# Patient Record
Sex: Male | Born: 1955 | Race: White | Hispanic: No | Marital: Single | Smoking: Current every day smoker
Health system: Southern US, Community
[De-identification: ages and names within clinical notes are randomized; demographics above are authoritative.]

## PROBLEM LIST (undated history)

## (undated) DIAGNOSIS — M899 Disorder of bone, unspecified: Principal | ICD-10-CM

## (undated) DIAGNOSIS — C649 Malignant neoplasm of unspecified kidney, except renal pelvis: Secondary | ICD-10-CM

## (undated) HISTORY — DX: Disorder of bone, unspecified: M89.9

## (undated) HISTORY — PX: OTHER SURGICAL HISTORY: SHX169

## (undated) HISTORY — DX: Malignant neoplasm of unspecified kidney, except renal pelvis: C64.9

---

## 2015-10-20 ENCOUNTER — Other Ambulatory Visit (HOSPITAL_COMMUNITY): Payer: Self-pay | Admitting: Oncology

## 2015-10-20 ENCOUNTER — Encounter (HOSPITAL_COMMUNITY): Payer: Self-pay | Admitting: Oncology

## 2015-10-20 DIAGNOSIS — M899 Disorder of bone, unspecified: Secondary | ICD-10-CM

## 2015-10-20 HISTORY — DX: Disorder of bone, unspecified: M89.9

## 2015-10-21 ENCOUNTER — Ambulatory Visit (HOSPITAL_COMMUNITY)
Admission: RE | Admit: 2015-10-21 | Discharge: 2015-10-21 | Disposition: A | Discharge status: Home / Self Care | Payer: BLUE CROSS/BLUE SHIELD | Source: Ambulatory Visit | Attending: Oncology | Admitting: Oncology

## 2015-10-21 DIAGNOSIS — N2889 Other specified disorders of kidney and ureter: Secondary | ICD-10-CM | POA: Diagnosis not present

## 2015-10-21 DIAGNOSIS — M899 Disorder of bone, unspecified: Secondary | ICD-10-CM

## 2015-10-21 LAB — POCT I-STAT CREATININE: Creatinine, Ser: 1.1 mg/dL (ref 0.61–1.24)

## 2015-10-21 MED ORDER — IOHEXOL 300 MG/ML  SOLN
75.0000 mL | Freq: Once | INTRAMUSCULAR | Status: AC | PRN
  Administered 2015-10-21: 75 mL via INTRAVENOUS

## 2015-10-21 MED ORDER — GADOBENATE DIMEGLUMINE 529 MG/ML IV SOLN
14.0000 mL | Freq: Once | INTRAVENOUS | Status: AC | PRN
  Administered 2015-10-21: 14 mL via INTRAVENOUS

## 2015-10-21 MED ORDER — IOHEXOL 350 MG/ML SOLN
75.0000 mL | Freq: Once | INTRAVENOUS | Status: AC | PRN
  Administered 2015-10-21: 100 mL via INTRAVENOUS

## 2015-10-23 ENCOUNTER — Encounter (HOSPITAL_COMMUNITY): Payer: Self-pay | Admitting: Oncology

## 2015-10-23 DIAGNOSIS — C649 Malignant neoplasm of unspecified kidney, except renal pelvis: Secondary | ICD-10-CM | POA: Insufficient documentation

## 2015-10-23 HISTORY — DX: Malignant neoplasm of unspecified kidney, except renal pelvis: C64.9

## 2015-10-23 NOTE — Assessment & Plan Note (Addendum)
Right renal mass measuring 6.2 cm (in largest dimension) with right proximal sclerotic lesion of femur, right skull bass destructive lesion with involvement of the medial clivus, occipital condyle, petrous apex and extension into the right cavernous sinus and sphenoid sinus, and intracranial extension into the ventral epidural space.  Imaging suspicious for metastatic renal cell carcinoma.  Labs today: CBC diff, CMET, LDH  I personally reviewed and went over radiographic studies with the patient.  The results are noted within this dictation.    Case discussed with IR.  CT biopsy of right renal lesion is ordered based upon their recommendations for diagnosis.  Case discussed with Neurosurgery.  Dr. Kathyrn Sheriff denies any neurosurgical urgency/emergency.  He would consider seeing there patient following XRT.  Case discussed with Dr. Tammi Klippel, Rad Onc.  He is agreeable to see the patient for XRT therapy.    Message left for Dr. Alinda Money (urology) to discussed the patient's case.  We are awaiting a return phone call.  Would like for him to consider cytoreductive right nephrectomy.   Assuming this is metastatic renal cell, the patient is provided some minor education regarding this in addition to treatment options including XRT and systemic chemotherapy are broached.  Prognosis is also broached as well.  He knows that if this is metastatic renal cell carcinoma, his disease is not curable, but treatable with a possible life expectancy of a couple of years.  We will discuss this further once we have a diagnosis.  Patient will return ~ 5 days post IR biopsy for review of pathology results and further recommendations.
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
## 2015-10-24 ENCOUNTER — Encounter (HOSPITAL_COMMUNITY): Payer: BLUE CROSS/BLUE SHIELD | Attending: Oncology | Admitting: Oncology

## 2015-10-24 ENCOUNTER — Encounter (HOSPITAL_COMMUNITY): Payer: Self-pay | Admitting: Oncology

## 2015-10-24 VITALS — BP 145/98 | HR 93 | Temp 99.4°F | Resp 20 | Wt 157.0 lb

## 2015-10-24 DIAGNOSIS — Z8249 Family history of ischemic heart disease and other diseases of the circulatory system: Secondary | ICD-10-CM | POA: Diagnosis not present

## 2015-10-24 DIAGNOSIS — H4921 Sixth [abducent] nerve palsy, right eye: Secondary | ICD-10-CM | POA: Diagnosis not present

## 2015-10-24 DIAGNOSIS — M899 Disorder of bone, unspecified: Secondary | ICD-10-CM | POA: Diagnosis present

## 2015-10-24 DIAGNOSIS — N289 Disorder of kidney and ureter, unspecified: Secondary | ICD-10-CM | POA: Diagnosis not present

## 2015-10-24 DIAGNOSIS — Z720 Tobacco use: Secondary | ICD-10-CM

## 2015-10-24 DIAGNOSIS — N2889 Other specified disorders of kidney and ureter: Secondary | ICD-10-CM | POA: Insufficient documentation

## 2015-10-24 DIAGNOSIS — F17210 Nicotine dependence, cigarettes, uncomplicated: Secondary | ICD-10-CM | POA: Insufficient documentation

## 2015-10-24 DIAGNOSIS — G588 Other specified mononeuropathies: Secondary | ICD-10-CM

## 2015-10-24 DIAGNOSIS — R930 Abnormal findings on diagnostic imaging of skull and head, not elsewhere classified: Secondary | ICD-10-CM

## 2015-10-24 LAB — COMPREHENSIVE METABOLIC PANEL
ALK PHOS: 83 U/L (ref 38–126)
ALT: 16 U/L — AB (ref 17–63)
AST: 15 U/L (ref 15–41)
Albumin: 4.1 g/dL (ref 3.5–5.0)
Anion gap: 7 (ref 5–15)
BUN: 13 mg/dL (ref 6–20)
CALCIUM: 8.9 mg/dL (ref 8.9–10.3)
CO2: 24 mmol/L (ref 22–32)
CREATININE: 0.93 mg/dL (ref 0.61–1.24)
Chloride: 107 mmol/L (ref 101–111)
Glucose, Bld: 102 mg/dL — ABNORMAL HIGH (ref 65–99)
Potassium: 4 mmol/L (ref 3.5–5.1)
Sodium: 138 mmol/L (ref 135–145)
Total Bilirubin: 0.2 mg/dL — ABNORMAL LOW (ref 0.3–1.2)
Total Protein: 7.3 g/dL (ref 6.5–8.1)

## 2015-10-24 LAB — CBC WITH DIFFERENTIAL/PLATELET
Basophils Absolute: 0.1 10*3/uL (ref 0.0–0.1)
Basophils Relative: 1 %
Eosinophils Absolute: 0.1 10*3/uL (ref 0.0–0.7)
Eosinophils Relative: 1 %
HEMATOCRIT: 49.9 % (ref 39.0–52.0)
HEMOGLOBIN: 16.9 g/dL (ref 13.0–17.0)
LYMPHS ABS: 2.4 10*3/uL (ref 0.7–4.0)
LYMPHS PCT: 22 %
MCH: 30 pg (ref 26.0–34.0)
MCHC: 33.9 g/dL (ref 30.0–36.0)
MCV: 88.5 fL (ref 78.0–100.0)
Monocytes Absolute: 0.9 10*3/uL (ref 0.1–1.0)
Monocytes Relative: 9 %
NEUTROS PCT: 67 %
Neutro Abs: 7.3 10*3/uL (ref 1.7–7.7)
Platelets: 251 10*3/uL (ref 150–400)
RBC: 5.64 MIL/uL (ref 4.22–5.81)
RDW: 13.3 % (ref 11.5–15.5)
WBC: 10.8 10*3/uL — AB (ref 4.0–10.5)

## 2015-10-24 LAB — LACTATE DEHYDROGENASE: LDH: 135 U/L (ref 98–192)

## 2015-10-24 NOTE — Patient Instructions (Signed)
[place] at [facility] Discharge Instructions  RECOMMENDATIONS MADE BY THE CONSULTANT AND ANY TEST RESULTS WILL BE SENT TO YOUR REFERRING PHYSICIAN.    Thank you for choosing [place] at [facility] to provide your oncology and hematology care.  To afford each patient quality time with our provider, please arrive at least 15 minutes before your scheduled appointment time.   Beginning January 23rd 2017 lab work for the [facility] will be done in the  Main lab at [facility] on 1st floor. If you have a lab appointment with the [place] please come in thru the  Main Entrance and check in at the main information desk  You need to re-schedule your appointment should you arrive 10 or more minutes late.  We strive to give you quality time with our providers, and arriving late affects you and other patients whose appointments are after yours.  Also, if you no show three or more times for appointments you may be dismissed from the clinic at the providers discretion.     Again, thank you for choosing [facility].  Our hope is that these requests will decrease the amount of time that you wait before being seen by our physicians.       _____________________________________________________________  Should you have questions after your visit to [facility], please contact our office at [phone] between the hours of 8:30 a.m. and 4:30 p.m.  Voicemails left after 4:30 p.m. will not be returned until the following business day.  For prescription refill requests, have your pharmacy contact our office.

## 2015-10-26 ENCOUNTER — Other Ambulatory Visit: Payer: Self-pay | Admitting: Radiation Therapy

## 2015-10-26 DIAGNOSIS — C7951 Secondary malignant neoplasm of bone: Principal | ICD-10-CM

## 2015-10-26 DIAGNOSIS — C649 Malignant neoplasm of unspecified kidney, except renal pelvis: Secondary | ICD-10-CM

## 2015-10-27 ENCOUNTER — Ambulatory Visit: Payer: BLUE CROSS/BLUE SHIELD | Admitting: Radiation Oncology

## 2015-10-27 ENCOUNTER — Ambulatory Visit: Payer: BLUE CROSS/BLUE SHIELD

## 2015-10-28 ENCOUNTER — Ambulatory Visit: Payer: BLUE CROSS/BLUE SHIELD | Admitting: Radiation Oncology

## 2015-10-31 ENCOUNTER — Other Ambulatory Visit: Payer: Self-pay | Admitting: Radiology
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
## 2015-11-01 ENCOUNTER — Other Ambulatory Visit (HOSPITAL_COMMUNITY): Payer: Self-pay | Admitting: Oncology

## 2015-11-01 ENCOUNTER — Ambulatory Visit (HOSPITAL_COMMUNITY)
Admission: RE | Admit: 2015-11-01 | Discharge: 2015-11-01 | Disposition: A | Discharge status: Home / Self Care | Payer: BLUE CROSS/BLUE SHIELD | Source: Ambulatory Visit | Attending: Oncology | Admitting: Oncology

## 2015-11-01 ENCOUNTER — Encounter (HOSPITAL_COMMUNITY): Payer: Self-pay

## 2015-11-01 DIAGNOSIS — N2889 Other specified disorders of kidney and ureter: Secondary | ICD-10-CM

## 2015-11-01 DIAGNOSIS — C641 Malignant neoplasm of right kidney, except renal pelvis: Secondary | ICD-10-CM | POA: Insufficient documentation

## 2015-11-01 LAB — CBC
HEMATOCRIT: 52.3 % — AB (ref 39.0–52.0)
Hemoglobin: 17.9 g/dL — ABNORMAL HIGH (ref 13.0–17.0)
MCH: 30.1 pg (ref 26.0–34.0)
MCHC: 34.2 g/dL (ref 30.0–36.0)
MCV: 88 fL (ref 78.0–100.0)
PLATELETS: 227 10*3/uL (ref 150–400)
RBC: 5.94 MIL/uL — ABNORMAL HIGH (ref 4.22–5.81)
RDW: 13.5 % (ref 11.5–15.5)
WBC: 10.4 10*3/uL (ref 4.0–10.5)

## 2015-11-01 LAB — GLUCOSE, CAPILLARY: GLUCOSE-CAPILLARY: 104 mg/dL — AB (ref 65–99)

## 2015-11-01 LAB — PROTIME-INR
INR: 1.04 (ref 0.00–1.49)
Prothrombin Time: 13.8 seconds (ref 11.6–15.2)

## 2015-11-01 LAB — APTT: APTT: 30 s (ref 24–37)

## 2015-11-01 MED ORDER — MIDAZOLAM HCL 2 MG/2ML IJ SOLN
INTRAMUSCULAR | Status: AC
  Filled 2015-11-01: qty 2

## 2015-11-01 MED ORDER — MIDAZOLAM HCL 2 MG/2ML IJ SOLN
INTRAMUSCULAR | Status: AC | PRN
  Administered 2015-11-01: 0.5 mg via INTRAVENOUS
  Administered 2015-11-01: 1 mg via INTRAVENOUS
  Administered 2015-11-01: 0.5 mg via INTRAVENOUS

## 2015-11-01 MED ORDER — SODIUM CHLORIDE 0.9 % IV SOLN
Freq: Once | INTRAVENOUS | Status: AC
  Administered 2015-11-01: 08:00:00 via INTRAVENOUS

## 2015-11-01 MED ORDER — GELATIN ABSORBABLE 12-7 MM EX MISC
CUTANEOUS | Status: AC
  Filled 2015-11-01: qty 1

## 2015-11-01 MED ORDER — FENTANYL CITRATE (PF) 100 MCG/2ML IJ SOLN
INTRAMUSCULAR | Status: AC | PRN
  Administered 2015-11-01: 50 ug via INTRAVENOUS

## 2015-11-01 MED ORDER — HYDROCODONE-ACETAMINOPHEN 5-325 MG PO TABS: 1.0000 | ORAL_TABLET | ORAL | Status: DC | PRN

## 2015-11-01 MED ORDER — FENTANYL CITRATE (PF) 100 MCG/2ML IJ SOLN
INTRAMUSCULAR | Status: AC
  Filled 2015-11-01: qty 2

## 2015-11-01 MED ORDER — LIDOCAINE HCL 1 % IJ SOLN
INTRAMUSCULAR | Status: AC
Start: 2015-11-01 — End: 2015-11-01
  Filled 2015-11-01: qty 20

## 2015-11-01 NOTE — Discharge Instructions (Signed)
Kidney Biopsy, Care After °Refer to this sheet in the next few weeks. These instructions provide you with information on caring for yourself after your procedure. Your health care provider may also give you more specific instructions. Your treatment has been planned according to current medical practices, but problems sometimes occur. Call your health care provider if you have any problems or questions after your procedure.  °WHAT TO EXPECT AFTER THE PROCEDURE  °· You may notice blood in the urine for the first 24 hours after the biopsy. °· You may feel some pain at the biopsy site for 1-2 weeks after the biopsy. °HOME CARE INSTRUCTIONS °· Do not lift anything heavier than 10 lb (4.5 kg) for 2 weeks. °· Do not take any non-steroidal anti-inflammatory drugs (NSAIDs) or any blood thinners for a week after the biopsy unless instructed to do so by your health care provider. °· Only take medicines for pain, fever, or discomfort as directed by your health care provider. °SEEK MEDICAL CARE IF: °· You have bloody urine more than 24 hours after the biopsy.   °· You develop a fever.   °· You cannot urinate.   °· You have increasing pain at the biopsy site.   °SEEK IMMEDIATE MEDICAL CARE IF: °You feel faint or dizzy.  °  °This information is not intended to replace advice given to you by your health care provider. Make sure you discuss any questions you have with your health care provider. °  °Document Released: 04/22/2013 Document Reviewed: 04/22/2013 °Elsevier Interactive Patient Education ©2016 [employer] ° °

## 2015-11-01 NOTE — Procedures (Signed)
US guided core biopsies of right renal lesion.  3 cores obtained.  Gelfoam slurry injected through coaxial needle after biopsy.  Minimal blood loss.  No immediate complication.

## 2015-11-01 NOTE — H&P (Signed)
Chief Complaint: Patient was seen in consultation today for Right Renal mass biopsy at the request of Dr Whitney Muse  Referring Physician(s): Dr Larene Beach Kathrene Alu, Adam  History of Present Illness: Gregory [name]. is a 60 y.o. male   Pt was accidentally hit in Rt eye with cue stick weeks ago Continued to have vision changes and was seen by Ophthalmologist   regarding VI nerve palsy CT/MRI reveals abnormal findings: Skull base lesion; Rt renal mass; Rt femur lesion MRI: IMPRESSION: 1. 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 2. No other evidence of intracranial metastases.  Was referred to Dr Whitney Muse Now requesting biopsy of Right renal mass concerning for renal cell carcinoma Her note reflects discussions with Dr Laurence Ferrari; Dr Tammi Klippel; Dr Kathyrn Sheriff Not seen by Nephrology as of yet Need for biopsy is immediate per Dr Whitney Muse note  Past Medical History  Diagnosis Date  . Skull lesion 10/20/2015  . Renal mass, right 10/23/2015    History reviewed. No pertinent past surgical history.  Allergies: Review of patient's allergies indicates no known allergies.  Medications: Prior to Admission medications   Not on File     Family History  Problem Relation Age of Onset  . CAD Mother   . CAD Father     Social History   Social History  . Marital Status: Single    Spouse Name: N/A  . Number of Children: N/A  . Years of Education: N/A   Occupational History  . Electrician Other    [place] History Main Topics  . Smoking status: Current Every Day Smoker -- 1.00 packs/day for 45 years    Types: Cigarettes  . Smokeless tobacco: None  . Alcohol Use: 12.0 oz/week    20 Shots of liquor per week     Comment: 1-5 shot of liquor per night  . Drug Use: 1.00 per week    Special: Marijuana     Comment: occasionally  . Sexual Activity: Not Asked   Other Topics Concern    . None   Social History Narrative     Review of Systems: A 12 point ROS discussed and pertinent positives are indicated in the HPI above.  All other systems are negative.  Review of Systems  Constitutional: Negative for fever, activity change, appetite change and fatigue.  HENT: Negative for facial swelling.   Eyes: Positive for photophobia and visual disturbance. Negative for pain and redness.  Respiratory: Negative for cough and shortness of breath.   Neurological: Negative for weakness.  Psychiatric/Behavioral: Negative for behavioral problems and confusion.    Vital Signs: BP 130/89 mmHg  Pulse 101  Temp(Src) 98.2 F (36.8 C) (Oral)  Resp 10  Ht 5\' 7"  (1.702 m)  Wt 157 lb (71.215 kg)  BMI 24.58 kg/m2  SpO2 99%  Physical Exam  Constitutional: He is oriented to person, place, and time.  Eyes: EOM are normal.  Still complains of double vision - Rt eye  Cardiovascular: Normal rate, regular rhythm and normal heart sounds.   Pulmonary/Chest: Effort normal and breath sounds normal.  Abdominal: Soft. Bowel sounds are normal.  Musculoskeletal: Normal range of motion.  Neurological: He is alert and oriented to person, place, and time.  Skin: Skin is warm and dry.  Psychiatric: He has a normal mood and affect. His behavior is normal. Thought content normal.  Nursing note and vitals reviewed.   Mallampati Score:  MD Evaluation Airway: WNL  Heart: WNL Abdomen: WNL Chest/ Lungs: WNL ASA  Classification: 3 Mallampati/Airway Score: One  Imaging: Ct Angio Head W/cm &/or Wo Cm  10/21/2015  CLINICAL DATA:  VIth nerve palsy.  Symptoms for 1-2 months. EXAM: CT ANGIOGRAPHY HEAD AND NECK TECHNIQUE: Multidetector CT imaging of the head and neck was performed using the standard protocol during bolus administration of intravenous contrast. Multiplanar CT image reconstructions and MIPs were obtained to evaluate the vascular anatomy. Carotid stenosis measurements (when applicable) are  obtained utilizing NASCET criteria, using the distal internal carotid diameter as the denominator. CONTRAST:  146mL OMNIPAQUE IOHEXOL 350 MG/ML SOLN COMPARISON:  CT head performed 10/20/2015. CT of the chest abdomen and pelvis reported separately. FINDINGS: CT HEAD Calvarium and skull base: Destructive mass at the RIGHT skull base redemonstrated. The medial clivus is destroyed and the mass projects into the RIGHT cavernous sinus and sphenoid sinus. The mass involves the RIGHT occipital condyle. There is involvement of the RIGHT hypoglossal foramen. Approximate cross-section measurements are greater than appreciated on noncontrast CT, approximately 15 mm transverse, 49 mm anterior-posterior, and 15 mm craniocaudal. The mass demonstrates fairly avid postcontrast enhancement, particularly at its margins suggesting a vascular tumor. Mastoids and middle ears are grossly clear. Paranasal sinuses: Imaged portions are clear. Orbits: Negative. Brain: No evidence of acute infarct, hemorrhage, hydrocephalus, or intra-axial mass lesion. There is slight extension of the skull base lesion into the prepontine cistern and inferior cerebellopontine angle on the RIGHT. No displacement of the brainstem or cerebellum. Tumor extension into the inferior cavernous sinus on the RIGHT, correlates with the VIth nerve palsy. CTA NECK Aortic arch: Standard branching. Imaged portion shows no evidence of aneurysm or dissection. No significant stenosis of the major arch vessel origins. Right carotid system: No evidence of dissection, stenosis (50% or greater) or occlusion. Left carotid system: No evidence of dissection, stenosis (50% or greater) or occlusion. Vertebral arteries: Codominant. No evidence of dissection, stenosis (50% or greater) or occlusion. Other: Minor spondylosis. RIGHT TMJ arthropathy. No cervical spine or upper thoracic spine metastases. Central protrusion C5-6 and C6-7. Cervical facet arthropathy. Chest findings reported  separately. CTA HEAD Anterior circulation: LEFT ICA is normal. RIGHT ICA demonstrates lateral displacement in its petrous and inferior cavernous segments by the vascular skullbase mass. No evidence for aneurysm or dissection. No significant intracranial stenosis, proximal occlusion, aneurysm, or vascular malformation. Mild calcific atheromatous change. Posterior circulation: Unremarkable. No significant stenosis, proximal occlusion, aneurysm, or vascular malformation. Venous sinuses: As permitted by contrast timing, patent. Anatomic variants: Fetal RIGHT PCA. Delayed phase:  No visible brain metastases. 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 There is involvement of the medial clivus, occipital condyle, petrous apex, with extension into the RIGHT cavernous sinus and sphenoid sinus. Intracranial extension into the ventral epidural space but no intra-axial lesions. Given the appearance of the RIGHT kidney, metastatic renal cell carcinoma is favored. Extrinsic mass effect on the petrous and inferior cavernous RIGHT internal carotid artery without evidence for aneurysm. No flow reducing intracranial or extracranial stenosis. Electronically Signed   By: Staci Righter M.D.   On: 10/21/2015 13:14   Ct Angio Neck W/cm &/or Wo/cm  10/21/2015  CLINICAL DATA:  VIth nerve palsy.  Symptoms for 1-2 months. EXAM: CT ANGIOGRAPHY HEAD AND NECK TECHNIQUE: Multidetector CT imaging of the head and neck was performed using the standard protocol during bolus administration of intravenous contrast. Multiplanar CT image reconstructions and MIPs were obtained to evaluate the vascular anatomy. Carotid stenosis measurements (  when applicable) are obtained utilizing NASCET criteria, using the distal internal carotid diameter as the denominator. CONTRAST:  151mL OMNIPAQUE IOHEXOL 350 MG/ML SOLN COMPARISON:  CT head performed 10/20/2015. CT of the chest abdomen and pelvis reported separately.  FINDINGS: CT HEAD Calvarium and skull base: Destructive mass at the RIGHT skull base redemonstrated. The medial clivus is destroyed and the mass projects into the RIGHT cavernous sinus and sphenoid sinus. The mass involves the RIGHT occipital condyle. There is involvement of the RIGHT hypoglossal foramen. Approximate cross-section measurements are greater than appreciated on noncontrast CT, approximately 15 mm transverse, 49 mm anterior-posterior, and 15 mm craniocaudal. The mass demonstrates fairly avid postcontrast enhancement, particularly at its margins suggesting a vascular tumor. Mastoids and middle ears are grossly clear. Paranasal sinuses: Imaged portions are clear. Orbits: Negative. Brain: No evidence of acute infarct, hemorrhage, hydrocephalus, or intra-axial mass lesion. There is slight extension of the skull base lesion into the prepontine cistern and inferior cerebellopontine angle on the RIGHT. No displacement of the brainstem or cerebellum. Tumor extension into the inferior cavernous sinus on the RIGHT, correlates with the VIth nerve palsy. CTA NECK Aortic arch: Standard branching. Imaged portion shows no evidence of aneurysm or dissection. No significant stenosis of the major arch vessel origins. Right carotid system: No evidence of dissection, stenosis (50% or greater) or occlusion. Left carotid system: No evidence of dissection, stenosis (50% or greater) or occlusion. Vertebral arteries: Codominant. No evidence of dissection, stenosis (50% or greater) or occlusion. Other: Minor spondylosis. RIGHT TMJ arthropathy. No cervical spine or upper thoracic spine metastases. Central protrusion C5-6 and C6-7. Cervical facet arthropathy. Chest findings reported separately. CTA HEAD Anterior circulation: LEFT ICA is normal. RIGHT ICA demonstrates lateral displacement in its petrous and inferior cavernous segments by the vascular skullbase mass. No evidence for aneurysm or dissection. No significant  intracranial stenosis, proximal occlusion, aneurysm, or vascular malformation. Mild calcific atheromatous change. Posterior circulation: Unremarkable. No significant stenosis, proximal occlusion, aneurysm, or vascular malformation. Venous sinuses: As permitted by contrast timing, patent. Anatomic variants: Fetal RIGHT PCA. Delayed phase:  No visible brain metastases. 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 There is involvement of the medial clivus, occipital condyle, petrous apex, with extension into the RIGHT cavernous sinus and sphenoid sinus. Intracranial extension into the ventral epidural space but no intra-axial lesions. Given the appearance of the RIGHT kidney, metastatic renal cell carcinoma is favored. Extrinsic mass effect on the petrous and inferior cavernous RIGHT internal carotid artery without evidence for aneurysm. No flow reducing intracranial or extracranial stenosis. Electronically Signed   By: Staci Righter M.D.   On: 10/21/2015 13:14   Ct Chest W Contrast  10/21/2015  CLINICAL DATA:  Skullbase lesion concerning for metastatic disease. Lesion discovered on recent head CT. EXAM: CT CHEST, ABDOMEN, AND PELVIS WITH CONTRAST TECHNIQUE: Multidetector CT imaging of the chest, abdomen and pelvis was performed following the standard protocol during bolus administration of intravenous contrast. CONTRAST:  70mL OMNIPAQUE IOHEXOL 300 MG/ML SOLN, 159mL OMNIPAQUE IOHEXOL 350 MG/ML SOLN COMPARISON:  Head CT 10/20/2015 FINDINGS: CT CHEST FINDINGS Mediastinum/Nodes: No axillary or supraclavicular lymphadenopathy. No mediastinal hilar lymphadenopathy. No pericardial fluid. Esophagus normal. Lungs/Pleura: Flattened nodule along the RIGHT oblique fissure measures 7 mm (images 35, series 6). Musculoskeletal: No aggressive osseous lesion. CT ABDOMEN AND PELVIS FINDINGS Hepatobiliary: No focal hepatic lesion. No biliary ductal dilatation. Gallbladder is normal. Common  bile duct is normal. Pancreas: Pancreas is normal. No ductal dilatation. No pancreatic inflammation.  Spleen: Normal spleen Adrenals/urinary tract: Adrenal glands are normal. Large irregular peripherally enhancing rounded mass extends from the mid upper RIGHT kidney measuring 5.6 x 5.7 cm in axial dimension and 6.2 cm in craniocaudad dimension. This RIGHT renal mass is contained within the para renal fascia. The mass extends into the RIGHT renal hilum. No evidence of involvement of the RIGHT renal vein. IVC is normal. No retroperitoneal lymphadenopathy. Small nonobstructing calculus in the lower pole of the LEFT kidney. Low-attenuation the rounded lesion in the lower pole LEFT kidney with simple fluid attenuation is most consistent with benign renal cysts. The ureters and bladder normal. Stomach/Bowel: Stomach, small bowel, appendix, and cecum are normal. The colon and rectosigmoid colon are normal. Moderate volume stool in the rectum. No obstructing lesion. Vascular/Lymphatic: Abdominal aorta is normal caliber with atherosclerotic calcification. There is no retroperitoneal or periportal lymphadenopathy. No pelvic lymphadenopathy. Reproductive: Prostate biopsy. Other: No peritoneal disease. Musculoskeletal: Sclerotic lesion in the proximal RIGHT femur measures 15 mm on image 123, series 2 IMPRESSION: Chest Impression: 1. No evidence thoracic metastasis. 2. Nodule along the RIGHT oblique fissure likely represents a benign intrafissural lymph node 3. No mediastinal lymphadenopathy Abdomen / Pelvis Impression: 1. Enlarged irregular enhancing mass partially exophytic from the RIGHT kidney consistent with RENAL CELL CARCINOMA. 2. No evidence of involvement of the RIGHT renal vein. 3. No retroperitoneal lymphadenopathy 4. RIGHT renal mass is contained within the para renal fascia. 5. Sclerotic lesion within the intertrochanteric proximal RIGHT femur is indeterminate. With skullbase lesion described on comparison CT,  metastasis is a concern. These results will be called to the ordering clinician or representative by the Radiologist Assistant, and communication documented in the PACS or zVision Dashboard. Electronically Signed   By: Suzy Bouchard M.D.   On: 10/21/2015 12:54   Mr Jeri Cos [id number] Contrast  10/21/2015  CLINICAL DATA:  Right-sided diplopia for the past month. Skullbase lesion. Right renal mass. EXAM: MRI HEAD WITHOUT AND WITH CONTRAST TECHNIQUE: Multiplanar, multiecho pulse sequences of the brain and surrounding structures were obtained without and with intravenous contrast. CONTRAST:  55mL MULTIHANCE GADOBENATE DIMEGLUMINE 529 MG/ML IV SOLN COMPARISON:  Head and neck CTA 10/21/2015 and CT orbits 10/20/2015 FINDINGS: There is no evidence of acute infarct, intracranial hemorrhage, intra-axial mass, midline shift, or extra-axial fluid collection. Ventricles and sulci are normal in size for age. There is minimal nonspecific T2 hyperintensity in the periventricular white matter bilaterally. No enhancing brain lesions are seen. As described on recent CTs, there is a destructive right-sided central/posterior skullbase mass which measures approximately 5 cm in greatest length and approximately 2.2 cm in transverse width. As seen on CT, the mass involves the basiocciput, occipital condyle, petrous temporal bone, and clivus and extends into the posterior aspect of the right cavernous sinus as well as into the posterolateral aspect of the right sphenoid sinus. Osseous destruction and relationship to the right ICA are better evaluated on earlier CTA. There is involvement of the hypoglossal canal. The no other suspicious skull lesions are identified. The orbits are unremarkable. There is trace right sphenoid sinus mucosal thickening, and there is trace fluid in the mastoid air cells bilaterally. Major intracranial vascular flow voids are preserved. IMPRESSION: 1. Destructive right central/posterior skullbase mass consistent  with metastasis as previously described. 2. No other evidence of intracranial metastases. Electronically Signed   By: Logan Bores M.D.   On: 10/21/2015 13:39   Ct Abdomen Pelvis W Contrast  10/21/2015  CLINICAL DATA:  Skullbase lesion concerning for metastatic  disease. Lesion discovered on recent head CT. EXAM: CT CHEST, ABDOMEN, AND PELVIS WITH CONTRAST TECHNIQUE: Multidetector CT imaging of the chest, abdomen and pelvis was performed following the standard protocol during bolus administration of intravenous contrast. CONTRAST:  31mL OMNIPAQUE IOHEXOL 300 MG/ML SOLN, 138mL OMNIPAQUE IOHEXOL 350 MG/ML SOLN COMPARISON:  Head CT 10/20/2015 FINDINGS: CT CHEST FINDINGS Mediastinum/Nodes: No axillary or supraclavicular lymphadenopathy. No mediastinal hilar lymphadenopathy. No pericardial fluid. Esophagus normal. Lungs/Pleura: Flattened nodule along the RIGHT oblique fissure measures 7 mm (images 35, series 6). Musculoskeletal: No aggressive osseous lesion. CT ABDOMEN AND PELVIS FINDINGS Hepatobiliary: No focal hepatic lesion. No biliary ductal dilatation. Gallbladder is normal. Common bile duct is normal. Pancreas: Pancreas is normal. No ductal dilatation. No pancreatic inflammation. Spleen: Normal spleen Adrenals/urinary tract: Adrenal glands are normal. Large irregular peripherally enhancing rounded mass extends from the mid upper RIGHT kidney measuring 5.6 x 5.7 cm in axial dimension and 6.2 cm in craniocaudad dimension. This RIGHT renal mass is contained within the para renal fascia. The mass extends into the RIGHT renal hilum. No evidence of involvement of the RIGHT renal vein. IVC is normal. No retroperitoneal lymphadenopathy. Small nonobstructing calculus in the lower pole of the LEFT kidney. Low-attenuation the rounded lesion in the lower pole LEFT kidney with simple fluid attenuation is most consistent with benign renal cysts. The ureters and bladder normal. Stomach/Bowel: Stomach, small bowel, appendix, and  cecum are normal. The colon and rectosigmoid colon are normal. Moderate volume stool in the rectum. No obstructing lesion. Vascular/Lymphatic: Abdominal aorta is normal caliber with atherosclerotic calcification. There is no retroperitoneal or periportal lymphadenopathy. No pelvic lymphadenopathy. Reproductive: Prostate biopsy. Other: No peritoneal disease. Musculoskeletal: Sclerotic lesion in the proximal RIGHT femur measures 15 mm on image 123, series 2 IMPRESSION: Chest Impression: 1. No evidence thoracic metastasis. 2. Nodule along the RIGHT oblique fissure likely represents a benign intrafissural lymph node 3. No mediastinal lymphadenopathy Abdomen / Pelvis Impression: 1. Enlarged irregular enhancing mass partially exophytic from the RIGHT kidney consistent with RENAL CELL CARCINOMA. 2. No evidence of involvement of the RIGHT renal vein. 3. No retroperitoneal lymphadenopathy 4. RIGHT renal mass is contained within the para renal fascia. 5. Sclerotic lesion within the intertrochanteric proximal RIGHT femur is indeterminate. With skullbase lesion described on comparison CT, metastasis is a concern. These results will be called to the ordering clinician or representative by the Radiologist Assistant, and communication documented in the PACS or zVision Dashboard. Electronically Signed   By: Suzy Bouchard M.D.   On: 10/21/2015 12:54    Labs:  CBC:  Recent Labs  10/24/15 1705 11/01/15 0700  WBC 10.8* 10.4  HGB 16.9 17.9*  HCT 49.9 52.3*  PLT 251 227    COAGS:  Recent Labs  11/01/15 0700  INR 1.04  APTT 30    BMP:  Recent Labs  10/21/15 1155 10/24/15 1705  NA  --  138  K  --  4.0  CL  --  107  CO2  --  24  GLUCOSE  --  102*  BUN  --  13  CALCIUM  --  8.9  CREATININE 1.10 0.93  GFRNONAA  --  >60  GFRAA  --  >60    LIVER FUNCTION TESTS:  Recent Labs  10/24/15 1705  BILITOT 0.2*  AST 15  ALT 16*  ALKPHOS 83  PROT 7.3  ALBUMIN 4.1    TUMOR MARKERS: No results  for input(s): AFPTM, CEA, CA199, CHROMGRNA in the last 8760 hours.  Assessment  and Plan:  Right renal mass discovered in work up after eye injury Work up revealed Rt renal mass; Rt skull base lesion; Rt femur lesion Now scheduled for Right renal mass biopsy to start treatment asap Risks and Benefits discussed with the patient including, but not limited to bleeding, infection, damage to adjacent structures or low yield requiring additional tests. All of the patient's questions were answered, patient is agreeable to proceed. Consent signed and in chart.   Thank you for this interesting consult.  I greatly enjoyed meeting Gregory [name]. and look forward to participating in their care.  A copy of this report was sent to the requesting provider on this date.  Electronically Signed: Monia Sabal A 11/01/2015, 7:42 AM   I spent a total of  30 Minutes   in face to face in clinical consultation, greater than 50% of which was counseling/coordinating care for Right renal mass bx

## 2015-11-02 ENCOUNTER — Encounter (HOSPITAL_COMMUNITY): Payer: Self-pay | Admitting: Oncology

## 2015-11-04 ENCOUNTER — Ambulatory Visit
Admission: RE | Admit: 2015-11-04 | Discharge: 2015-11-04 | Disposition: A | Discharge status: Home / Self Care | Payer: BLUE CROSS/BLUE SHIELD | Source: Ambulatory Visit | Attending: Radiation Oncology | Admitting: Radiation Oncology

## 2015-11-04 ENCOUNTER — Encounter: Payer: Self-pay | Admitting: Radiation Oncology

## 2015-11-04 ENCOUNTER — Encounter (HOSPITAL_COMMUNITY): Payer: Self-pay | Admitting: Hematology & Oncology

## 2015-11-04 ENCOUNTER — Encounter (HOSPITAL_COMMUNITY): Payer: BLUE CROSS/BLUE SHIELD | Attending: Hematology & Oncology | Admitting: Hematology & Oncology

## 2015-11-04 VITALS — BP 137/87 | HR 84 | Resp 16 | Ht 67.0 in | Wt 159.2 lb

## 2015-11-04 VITALS — BP 147/91 | HR 90 | Temp 98.4°F | Resp 20 | Wt 158.0 lb

## 2015-11-04 DIAGNOSIS — Z720 Tobacco use: Secondary | ICD-10-CM

## 2015-11-04 DIAGNOSIS — F17210 Nicotine dependence, cigarettes, uncomplicated: Secondary | ICD-10-CM | POA: Diagnosis not present

## 2015-11-04 DIAGNOSIS — M899 Disorder of bone, unspecified: Secondary | ICD-10-CM | POA: Insufficient documentation

## 2015-11-04 DIAGNOSIS — C641 Malignant neoplasm of right kidney, except renal pelvis: Secondary | ICD-10-CM | POA: Insufficient documentation

## 2015-11-04 DIAGNOSIS — C649 Malignant neoplasm of unspecified kidney, except renal pelvis: Secondary | ICD-10-CM

## 2015-11-04 DIAGNOSIS — C7951 Secondary malignant neoplasm of bone: Secondary | ICD-10-CM | POA: Insufficient documentation

## 2015-11-04 DIAGNOSIS — H4921 Sixth [abducent] nerve palsy, right eye: Secondary | ICD-10-CM

## 2015-11-04 DIAGNOSIS — Z510 Encounter for antineoplastic radiation therapy: Secondary | ICD-10-CM | POA: Diagnosis not present

## 2015-11-04 DIAGNOSIS — F1010 Alcohol abuse, uncomplicated: Secondary | ICD-10-CM | POA: Insufficient documentation

## 2015-11-04 DIAGNOSIS — D751 Secondary polycythemia: Secondary | ICD-10-CM

## 2015-11-04 DIAGNOSIS — N2889 Other specified disorders of kidney and ureter: Secondary | ICD-10-CM | POA: Insufficient documentation

## 2015-11-04 DIAGNOSIS — Z8249 Family history of ischemic heart disease and other diseases of the circulatory system: Secondary | ICD-10-CM | POA: Insufficient documentation

## 2015-11-04 DIAGNOSIS — R930 Abnormal findings on diagnostic imaging of skull and head, not elsewhere classified: Secondary | ICD-10-CM

## 2015-11-04 MED ORDER — LORAZEPAM 0.5 MG PO TABS
0.5000 mg | ORAL_TABLET | ORAL | Status: AC
  Administered 2015-11-04: 0.5 mg via ORAL
  Filled 2015-11-04: qty 1

## 2015-11-04 NOTE — Progress Notes (Signed)
Radiation Oncology         [phone]) [phone] ________________________________  Initial Outpatient Consultation  Name: Gregory [name]. MRN: [id number]  Date: 11/04/2015  DOB: 15-Dec-1955  CC:No PCP Per Patient  [name], Gregory Fam, MD   REFERRING PHYSICIAN: [name], MD  DIAGNOSIS: The primary encounter diagnosis was Renal cell carcinoma, right ([place]). A diagnosis of Skull lesion was also pertinent to this visit.  HISTORY OF PRESENT ILLNESS:Gregory [name]. is a 60 y.o. 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 He reports that one and a half months ago he was playing in his billiards league when he was hit in the right eye with her cue stick. This resulted in right eye discomfort and double vision.  He was seen by an ophthalmologist to recommended an MRI of the brain.  This was performed on 10/21/2015 , and revealed a destructive right central and posterior skull-based mass  Measuring 2.2 x 5 cm  involving  the basiocciput, occipital condyle, petrous temporal bone, and clivus and extends into the posterior aspect of the right cavernous sinus as well as into the posterolateral aspect of the right sphenoid sinus. A CT chest, abdomen and pelvis on 10/21/15 showed an enlarged irregular enhancing mass partially exophytic from the right kidney this was biopsied on 11/01/2015, and final pathology confirmed clear-cell renal cell carcinoma. He has plans to receive targeted therapy with Gregory [name] and is seen today by Gregory [name] and Gregory [name] for discussion of the role of sterotactic radiosurgery.   PREVIOUS RADIATION THERAPY: No  PAST MEDICAL HISTORY:  has a past medical history of Skull lesion (10/20/2015); Renal mass, right (10/23/2015); and Renal cell carcinoma ([place]) (10/23/2015). He has not received routine medical care.  PAST SURGICAL HISTORY:History reviewed. No pertinent past surgical  history.  FAMILY HISTORY:  Family History  Problem Relation Age of Onset  . CAD Mother   . CAD Father   . Cancer Paternal Uncle     stomach cancer  . Cancer Paternal Uncle     unknown    SOCIAL HISTORY:  Social History   Social History  . Marital Status: Single    Spouse Name: N/A  . Number of Children: N/A  . Years of Education: N/A   Occupational History  . Electrician Other    [place] History Main Topics  . Smoking status: Current Every Day Smoker -- 1.00 packs/day for 45 years    Types: Cigarettes  . Smokeless tobacco: Never Used  . Alcohol Use: 12.0 oz/week    20 Shots of liquor per week     Comment: 1-5 shot of liquor per night  . Drug Use: 1.00 per week    Special: Marijuana     Comment: occasionally  . Sexual Activity: Yes   Other Topics Concern  . Not on file   Social History Narrative    ALLERGIES: Review of patient's allergies indicates no known allergies.  MEDICATIONS:  No current outpatient prescriptions on file.   No current facility-administered medications for this encounter.    REVIEW OF SYSTEMS:  On review of systems, he reports he is doing pretty well but his only concern is his vision. He describes a sense of blurred vision when he raises forward, and finds that he has to look from a different direction to avoid this double vision. He has noticed a daily headache but  denies significant pain is resultant take Aleve without difficulty for this. He denies any seizure activity, nausea, vomiting, blurry vision. He denies any dry eye. He does not notice light sensitivity. He is not experiencing any chest pain, shortness of breath fevers or chills.a complete review of systems is obtained and is otherwise negative.   PHYSICAL EXAM:  height is 5\' 7"  (1.702 m) and weight is 159 lb 3.2 oz (72.213 kg). His blood pressure is 137/87 and his pulse is 84. His respiration is 16 and oxygen saturation is 100%.   In general this is a well appearing  Caucasian male in no acute distress. He all is alert and oriented x4 and appropriate throughout the examination. HEENT reveals that the patient is normocephalic, atraumatic. EOMs are intact, though he experiences double vision with gazing directly in front from the right eye, and lateral gaze. PERRLA. Skin is intact without any evidence of gross lesions. Gregory [name] examines the patient as well and cranial nerves II through XII are assessed and grossly intact.  KPS = 90  100 - Normal; no complaints; no evidence of disease. 90   - Able to carry on normal activity; minor signs or symptoms of disease. 80   - Normal activity with effort; some signs or symptoms of disease. 23   - Cares for self; unable to carry on normal activity or to do active work. 60   - Requires occasional assistance, but is able to care for most of his personal needs. 50   - Requires considerable assistance and frequent medical care. 76   - Disabled; requires special care and assistance. 36   - Severely disabled; hospital admission is indicated although death not imminent. 69   - Very sick; hospital admission necessary; active supportive treatment necessary. 10   - Moribund; fatal processes progressing rapidly. 0     - Dead  Karnofsky DA, Abelmann [place], Craver LS and Burchenal [facility] [phone]) The use of the nitrogen mustards in the palliative treatment of carcinoma: with particular reference to bronchogenic carcinoma Cancer 1 [id number]  LABORATORY DATA:  Lab Results  Component Value Date   WBC 10.4 11/01/2015   HGB 17.9* 11/01/2015   HCT 52.3* 11/01/2015   MCV 88.0 11/01/2015   PLT 227 11/01/2015   Lab Results  Component Value Date   NA 138 10/24/2015   K 4.0 10/24/2015   CL 107 10/24/2015   CO2 24 10/24/2015   Lab Results  Component Value Date   ALT 16* 10/24/2015   AST 15 10/24/2015   ALKPHOS 83 10/24/2015   BILITOT 0.2* 10/24/2015     RADIOGRAPHY: Ct Angio Head W/cm &/or Wo Cm  10/21/2015  CLINICAL DATA:  VIth  nerve palsy.  Symptoms for 1-2 months. EXAM: CT ANGIOGRAPHY HEAD AND NECK TECHNIQUE: Multidetector CT imaging of the head and neck was performed using the standard protocol during bolus administration of intravenous contrast. Multiplanar CT image reconstructions and MIPs were obtained to evaluate the vascular anatomy. Carotid stenosis measurements (when applicable) are obtained utilizing NASCET criteria, using the distal internal carotid diameter as the denominator. CONTRAST:  127mL OMNIPAQUE IOHEXOL 350 MG/ML SOLN COMPARISON:  CT head performed 10/20/2015. CT of the chest abdomen and pelvis reported separately. FINDINGS: CT HEAD Calvarium and skull base: Destructive mass at the RIGHT skull base redemonstrated. The medial clivus is destroyed and the mass projects into the RIGHT cavernous sinus and sphenoid sinus. The mass involves the RIGHT occipital condyle. There is involvement of the RIGHT hypoglossal foramen.  Approximate cross-section measurements are greater than appreciated on noncontrast CT, approximately 15 mm transverse, 49 mm anterior-posterior, and 15 mm craniocaudal. The mass demonstrates fairly avid postcontrast enhancement, particularly at its margins suggesting a vascular tumor. Mastoids and middle ears are grossly clear. Paranasal sinuses: Imaged portions are clear. Orbits: Negative. Brain: No evidence of acute infarct, hemorrhage, hydrocephalus, or intra-axial mass lesion. There is slight extension of the skull base lesion into the prepontine cistern and inferior cerebellopontine angle on the RIGHT. No displacement of the brainstem or cerebellum. Tumor extension into the inferior cavernous sinus on the RIGHT, correlates with the VIth nerve palsy. CTA NECK Aortic arch: Standard branching. Imaged portion shows no evidence of aneurysm or dissection. No significant stenosis of the major arch vessel origins. Right carotid system: No evidence of dissection, stenosis (50% or greater) or occlusion. Left  carotid system: No evidence of dissection, stenosis (50% or greater) or occlusion. Vertebral arteries: Codominant. No evidence of dissection, stenosis (50% or greater) or occlusion. Other: Minor spondylosis. RIGHT TMJ arthropathy. No cervical spine or upper thoracic spine metastases. Central protrusion C5-6 and C6-7. Cervical facet arthropathy. Chest findings reported separately. CTA HEAD Anterior circulation: LEFT ICA is normal. RIGHT ICA demonstrates lateral displacement in its petrous and inferior cavernous segments by the vascular skullbase mass. No evidence for aneurysm or dissection. No significant intracranial stenosis, proximal occlusion, aneurysm, or vascular malformation. Mild calcific atheromatous change. Posterior circulation: Unremarkable. No significant stenosis, proximal occlusion, aneurysm, or vascular malformation. Venous sinuses: As permitted by contrast timing, patent. Anatomic variants: Fetal RIGHT PCA. Delayed phase:  No visible brain metastases. 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 There is involvement of the medial clivus, occipital condyle, petrous apex, with extension into the RIGHT cavernous sinus and sphenoid sinus. Intracranial extension into the ventral epidural space but no intra-axial lesions. Given the appearance of the RIGHT kidney, metastatic renal cell carcinoma is favored. Extrinsic mass effect on the petrous and inferior cavernous RIGHT internal carotid artery without evidence for aneurysm. No flow reducing intracranial or extracranial stenosis. Electronically Signed   By: Staci Righter M.D.   On: 10/21/2015 13:14   Ct Angio Neck W/cm &/or Wo/cm  10/21/2015  CLINICAL DATA:  VIth nerve palsy.  Symptoms for 1-2 months. EXAM: CT ANGIOGRAPHY HEAD AND NECK TECHNIQUE: Multidetector CT imaging of the head and neck was performed using the standard protocol during bolus administration of intravenous contrast. Multiplanar CT image  reconstructions and MIPs were obtained to evaluate the vascular anatomy. Carotid stenosis measurements (when applicable) are obtained utilizing NASCET criteria, using the distal internal carotid diameter as the denominator. CONTRAST:  19mL OMNIPAQUE IOHEXOL 350 MG/ML SOLN COMPARISON:  CT head performed 10/20/2015. CT of the chest abdomen and pelvis reported separately. FINDINGS: CT HEAD Calvarium and skull base: Destructive mass at the RIGHT skull base redemonstrated. The medial clivus is destroyed and the mass projects into the RIGHT cavernous sinus and sphenoid sinus. The mass involves the RIGHT occipital condyle. There is involvement of the RIGHT hypoglossal foramen. Approximate cross-section measurements are greater than appreciated on noncontrast CT, approximately 15 mm transverse, 49 mm anterior-posterior, and 15 mm craniocaudal. The mass demonstrates fairly avid postcontrast enhancement, particularly at its margins suggesting a vascular tumor. Mastoids and middle ears are grossly clear. Paranasal sinuses: Imaged portions are clear. Orbits: Negative. Brain: No evidence of acute infarct, hemorrhage, hydrocephalus, or intra-axial mass lesion. There is slight extension of the skull base lesion into the prepontine cistern and inferior cerebellopontine angle  on the RIGHT. No displacement of the brainstem or cerebellum. Tumor extension into the inferior cavernous sinus on the RIGHT, correlates with the VIth nerve palsy. CTA NECK Aortic arch: Standard branching. Imaged portion shows no evidence of aneurysm or dissection. No significant stenosis of the major arch vessel origins. Right carotid system: No evidence of dissection, stenosis (50% or greater) or occlusion. Left carotid system: No evidence of dissection, stenosis (50% or greater) or occlusion. Vertebral arteries: Codominant. No evidence of dissection, stenosis (50% or greater) or occlusion. Other: Minor spondylosis. RIGHT TMJ arthropathy. No cervical spine  or upper thoracic spine metastases. Central protrusion C5-6 and C6-7. Cervical facet arthropathy. Chest findings reported separately. CTA HEAD Anterior circulation: LEFT ICA is normal. RIGHT ICA demonstrates lateral displacement in its petrous and inferior cavernous segments by the vascular skullbase mass. No evidence for aneurysm or dissection. No significant intracranial stenosis, proximal occlusion, aneurysm, or vascular malformation. Mild calcific atheromatous change. Posterior circulation: Unremarkable. No significant stenosis, proximal occlusion, aneurysm, or vascular malformation. Venous sinuses: As permitted by contrast timing, patent. Anatomic variants: Fetal RIGHT PCA. Delayed phase:  No visible brain metastases. 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 There is involvement of the medial clivus, occipital condyle, petrous apex, with extension into the RIGHT cavernous sinus and sphenoid sinus. Intracranial extension into the ventral epidural space but no intra-axial lesions. Given the appearance of the RIGHT kidney, metastatic renal cell carcinoma is favored. Extrinsic mass effect on the petrous and inferior cavernous RIGHT internal carotid artery without evidence for aneurysm. No flow reducing intracranial or extracranial stenosis. Electronically Signed   By: Staci Righter M.D.   On: 10/21/2015 13:14   Ct Chest W Contrast  10/21/2015  CLINICAL DATA:  Skullbase lesion concerning for metastatic disease. Lesion discovered on recent head CT. EXAM: CT CHEST, ABDOMEN, AND PELVIS WITH CONTRAST TECHNIQUE: Multidetector CT imaging of the chest, abdomen and pelvis was performed following the standard protocol during bolus administration of intravenous contrast. CONTRAST:  76mL OMNIPAQUE IOHEXOL 300 MG/ML SOLN, 11mL OMNIPAQUE IOHEXOL 350 MG/ML SOLN COMPARISON:  Head CT 10/20/2015 FINDINGS: CT CHEST FINDINGS Mediastinum/Nodes: No axillary or supraclavicular  lymphadenopathy. No mediastinal hilar lymphadenopathy. No pericardial fluid. Esophagus normal. Lungs/Pleura: Flattened nodule along the RIGHT oblique fissure measures 7 mm (images 35, series 6). Musculoskeletal: No aggressive osseous lesion. CT ABDOMEN AND PELVIS FINDINGS Hepatobiliary: No focal hepatic lesion. No biliary ductal dilatation. Gallbladder is normal. Common bile duct is normal. Pancreas: Pancreas is normal. No ductal dilatation. No pancreatic inflammation. Spleen: Normal spleen Adrenals/urinary tract: Adrenal glands are normal. Large irregular peripherally enhancing rounded mass extends from the mid upper RIGHT kidney measuring 5.6 x 5.7 cm in axial dimension and 6.2 cm in craniocaudad dimension. This RIGHT renal mass is contained within the para renal fascia. The mass extends into the RIGHT renal hilum. No evidence of involvement of the RIGHT renal vein. IVC is normal. No retroperitoneal lymphadenopathy. Small nonobstructing calculus in the lower pole of the LEFT kidney. Low-attenuation the rounded lesion in the lower pole LEFT kidney with simple fluid attenuation is most consistent with benign renal cysts. The ureters and bladder normal. Stomach/Bowel: Stomach, small bowel, appendix, and cecum are normal. The colon and rectosigmoid colon are normal. Moderate volume stool in the rectum. No obstructing lesion. Vascular/Lymphatic: Abdominal aorta is normal caliber with atherosclerotic calcification. There is no retroperitoneal or periportal lymphadenopathy. No pelvic lymphadenopathy. Reproductive: Prostate biopsy. Other: No peritoneal disease. Musculoskeletal: Sclerotic lesion in the proximal RIGHT femur measures  15 mm on image 123, series 2 IMPRESSION: Chest Impression: 1. No evidence thoracic metastasis. 2. Nodule along the RIGHT oblique fissure likely represents a benign intrafissural lymph node 3. No mediastinal lymphadenopathy Abdomen / Pelvis Impression: 1. Enlarged irregular enhancing mass  partially exophytic from the RIGHT kidney consistent with RENAL CELL CARCINOMA. 2. No evidence of involvement of the RIGHT renal vein. 3. No retroperitoneal lymphadenopathy 4. RIGHT renal mass is contained within the para renal fascia. 5. Sclerotic lesion within the intertrochanteric proximal RIGHT femur is indeterminate. With skullbase lesion described on comparison CT, metastasis is a concern. These results will be called to the ordering clinician or representative by the Radiologist Assistant, and communication documented in the PACS or zVision Dashboard. Electronically Signed   By: Suzy Bouchard M.D.   On: 10/21/2015 12:54   Gregory [name] [id number] Contrast  10/21/2015  CLINICAL DATA:  Right-sided diplopia for the past month. Skullbase lesion. Right renal mass. EXAM: MRI HEAD WITHOUT AND WITH CONTRAST TECHNIQUE: Multiplanar, multiecho pulse sequences of the brain and surrounding structures were obtained without and with intravenous contrast. CONTRAST:  75mL MULTIHANCE GADOBENATE DIMEGLUMINE 529 MG/ML IV SOLN COMPARISON:  Head and neck CTA 10/21/2015 and CT orbits 10/20/2015 FINDINGS: There is no evidence of acute infarct, intracranial hemorrhage, intra-axial mass, midline shift, or extra-axial fluid collection. Ventricles and sulci are normal in size for age. There is minimal nonspecific T2 hyperintensity in the periventricular white matter bilaterally. No enhancing brain lesions are seen. As described on recent CTs, there is a destructive right-sided central/posterior skullbase mass which measures approximately 5 cm in greatest length and approximately 2.2 cm in transverse width. As seen on CT, the mass involves the basiocciput, occipital condyle, petrous temporal bone, and clivus and extends into the posterior aspect of the right cavernous sinus as well as into the posterolateral aspect of the right sphenoid sinus. Osseous destruction and relationship to the right ICA are better evaluated on earlier CTA. There is  involvement of the hypoglossal canal. The no other suspicious skull lesions are identified. The orbits are unremarkable. There is trace right sphenoid sinus mucosal thickening, and there is trace fluid in the mastoid air cells bilaterally. Major intracranial vascular flow voids are preserved. IMPRESSION: 1. 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 2. No other evidence of intracranial metastases. Electronically Signed   By: Logan Bores M.D.   On: 10/21/2015 13:39   Ct Abdomen Pelvis W Contrast  10/21/2015  CLINICAL DATA:  Skullbase lesion concerning for metastatic disease. Lesion discovered on recent head CT. EXAM: CT CHEST, ABDOMEN, AND PELVIS WITH CONTRAST TECHNIQUE: Multidetector CT imaging of the chest, abdomen and pelvis was performed following the standard protocol during bolus administration of intravenous contrast. CONTRAST:  24mL OMNIPAQUE IOHEXOL 300 MG/ML SOLN, 135mL OMNIPAQUE IOHEXOL 350 MG/ML SOLN COMPARISON:  Head CT 10/20/2015 FINDINGS: CT CHEST FINDINGS Mediastinum/Nodes: No axillary or supraclavicular lymphadenopathy. No mediastinal hilar lymphadenopathy. No pericardial fluid. Esophagus normal. Lungs/Pleura: Flattened nodule along the RIGHT oblique fissure measures 7 mm (images 35, series 6). Musculoskeletal: No aggressive osseous lesion. CT ABDOMEN AND PELVIS FINDINGS Hepatobiliary: No focal hepatic lesion. No biliary ductal dilatation. Gallbladder is normal. Common bile duct is normal. Pancreas: Pancreas is normal. No ductal dilatation. No pancreatic inflammation. Spleen: Normal spleen Adrenals/urinary tract: Adrenal glands are normal. Large irregular peripherally enhancing rounded mass extends from the mid upper RIGHT kidney measuring 5.6 x 5.7 cm in axial dimension and 6.2 cm in craniocaudad dimension. This RIGHT renal mass  is contained within the para renal fascia. The mass extends into the RIGHT renal hilum. No evidence of  involvement of the RIGHT renal vein. IVC is normal. No retroperitoneal lymphadenopathy. Small nonobstructing calculus in the lower pole of the LEFT kidney. Low-attenuation the rounded lesion in the lower pole LEFT kidney with simple fluid attenuation is most consistent with benign renal cysts. The ureters and bladder normal. Stomach/Bowel: Stomach, small bowel, appendix, and cecum are normal. The colon and rectosigmoid colon are normal. Moderate volume stool in the rectum. No obstructing lesion. Vascular/Lymphatic: Abdominal aorta is normal caliber with atherosclerotic calcification. There is no retroperitoneal or periportal lymphadenopathy. No pelvic lymphadenopathy. Reproductive: Prostate biopsy. Other: No peritoneal disease. Musculoskeletal: Sclerotic lesion in the proximal RIGHT femur measures 15 mm on image 123, series 2 IMPRESSION: Chest Impression: 1. No evidence thoracic metastasis. 2. Nodule along the RIGHT oblique fissure likely represents a benign intrafissural lymph node 3. No mediastinal lymphadenopathy Abdomen / Pelvis Impression: 1. Enlarged irregular enhancing mass partially exophytic from the RIGHT kidney consistent with RENAL CELL CARCINOMA. 2. No evidence of involvement of the RIGHT renal vein. 3. No retroperitoneal lymphadenopathy 4. RIGHT renal mass is contained within the para renal fascia. 5. Sclerotic lesion within the intertrochanteric proximal RIGHT femur is indeterminate. With skullbase lesion described on comparison CT, metastasis is a concern. These results will be called to the ordering clinician or representative by the Radiologist Assistant, and communication documented in the PACS or zVision Dashboard. Electronically Signed   By: Suzy Bouchard M.D.   On: 10/21/2015 12:54   US Biopsy  11/01/2015  INDICATION: 60 year-old with a right renal lesion.  Tissue diagnosis is needed. EXAM: ULTRASOUND-GUIDED BIOPSY OF RIGHT RENAL LESION MEDICATIONS: None. ANESTHESIA/SEDATION: Moderate  (conscious) sedation was employed during this procedure. A total of Versed 2.0 mg and Fentanyl 50 mcg was administered intravenously. Moderate Sedation Time: 20 minutes. The patient's level of consciousness and vital signs were monitored continuously by radiology nursing throughout the procedure under my direct supervision. FLUOROSCOPY TIME:  None COMPLICATIONS: None immediate. PROCEDURE: The procedure was explained to the patient. The risks and benefits of the procedure were discussed and the patient's questions were addressed. Informed consent was obtained from the patient. Patient was placed prone. The right kidney was evaluated with ultrasound. The right renal lesion was identified. The right flank was prepped and draped in a sterile fashion using chlorhexidine. Skin was anesthetized with 1% lidocaine. A 17 gauge coaxial needle was directed into the right renal lesion with ultrasound guidance. Three 18 gauge core biopsies were obtained. A Gel-Foam slurry was injected through the 17 gauge coaxial needle as it was removed. Bandage placed over the puncture site. FINDINGS: Slightly hyperechoic exophytic lesion involving the right kidney. Biopsy needle was identified within the lesion on all occasions. The lesion was partially obscured at the end of the procedure due to the Gel-Foam. No significant bleeding or hematoma identified. IMPRESSION: Ultrasound-guided core biopsies of the right renal lesion. Electronically Signed   By: Markus Daft M.D.   On: 11/01/2015 18:11    IMPRESSION: Gregory [name] is a 60 yo male with metastatic renal cell carcinoma. He is a good candidate for SRS to the base of the skull.  PLAN: Today, Gregory [name] spoke with the patient and family about the findings and work-up thus far.  We discussed the natural history of skull base metastasis and general treatment, highlighting the role of radiotherapy in the management. We discussed the available radiation techniques, and focused on  the details  of logistics and delivery.  We reviewed the anticipated acute and late sequelae associated with radiation in this setting.  The patient would like to proceed with radiation and will be scheduled for CT simulation. He has a planning appointment scheduled for today. Gregory [name] discusses the rationale for treatment scheme of 5 fractions to avoid neurotoxicity. The liver the conversational all questions were to satisfaction the patient.  The above documentation reflects my direct findings during this shared patient visit. Please see the separate note by Gregory [name] on this date for the remainder of the patient's plan of care.  [place] [place], PAC    This document serves as a record of services personally performed by Shona Simpson, PAC and Tyler Pita, MD. It was created on their behalf by Lendon Collar, a trained medical scribe. The creation of this record is based on the scribe's personal observations and the provider's statements to them. This document has been checked and approved by the attending provider.

## 2015-11-04 NOTE — Progress Notes (Signed)
See progress note under physician encounter. 

## 2015-11-04 NOTE — Progress Notes (Signed)
  Radiation Oncology         [id number]) [phone] ________________________________  Name: Gregory [name]. MRN: [id number]  Date: 11/04/2015  DOB: [birth year]  SIMULATION AND TREATMENT PLANNING NOTE    ICD-9-CM ICD-10-CM   1. Bone metastases (HCC) 198.5 C79.51   2. 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  NARRATIVE:  The patient was brought to the [facility].  Identity was confirmed.  All relevant records and images related to the planned course of therapy were reviewed.  The patient freely provided informed written consent to proceed with treatment after reviewing the details related to the planned course of therapy. The consent form was witnessed and verified by the simulation staff. Intravenous access was established for contrast administration. Then, the patient was set-up in a stable reproducible supine position for radiation therapy.  A relocatable thermoplastic stereotactic head frame was fabricated for precise immobilization.  CT images were obtained.  Surface markings were placed.  The CT images were loaded into the planning software and fused with the patient's targeting MRI scan.  Then the target and avoidance structures were contoured.  Treatment planning then occurred.  The radiation prescription was entered and confirmed.  I have requested 3D planning  I have requested a DVH of the following structures: Brain stem, brain, left eye, right eye, lenses, optic chiasm, target volumes, uninvolved brain, and normal tissue.    SPECIAL TREATMENT PROCEDURE:  The planned course of therapy using radiation constitutes a special treatment procedure. Special care is required in the management of this patient for the following reasons. This treatment constitutes a Special Treatment Procedure for the following reason: High dose per fraction requiring special monitoring for increased toxicities of treatment  including daily imaging.  The special nature of the planned course of radiotherapy will require increased physician supervision and oversight to ensure patient's safety with optimal treatment outcomes.  PLAN:  The patient will receive 25 Gy in 5 fraction.  ________________________________  Sheral Apley Tammi Klippel, M.D.    This document serves as a record of services personally performed by Tyler Pita, MD. It was created on his behalf by Lendon Collar, a trained medical scribe. The creation of this record is based on the scribe's personal observations and the provider's statements to them. This document has been checked and approved by the attending provider.

## 2015-11-04 NOTE — Patient Instructions (Signed)
Contact our office if you have any questions following today's appointment: [phone].  

## 2015-11-04 NOTE — Progress Notes (Signed)
[facility] Hematology/Oncology Progress Note   Name: Gregory [name].      MRN: [id number]     Date: 11/04/2015 Time:9:22 AM   REFERRING PHYSICIAN:  Maye Hides, MD (Ophthalmologist)   REASON FOR CONSULT:   Stage IV Renal Cell Carcinoma CT biopsy R renal mass 11/01/2015 c/w Clear Cell Renal Carcinoma WHO nuclear Grade 2 Right renal mass measuring 6.2 cm (in largest dimension) with right proximal sclerotic lesion of femur, right skull bass destructive lesion with involvement of the medial clivus, occipital condyle, petrous apex and extension into the right cavernous sinus and sphenoid sinus, and intracranial extension into the ventral epidural space.  HISTORY OF PRESENT ILLNESS:   [name] is a 60 year old white American man with an unknown medical history who is referred to the [facility] via his ophthalmologist who saw the patient recently for a VIth nerve palsy resulting in an MRI of head demonstrating significant abnormalities suspicious for malignancy/metastatic disease.  [name] returns to the [place] today accompanied by his daughter. He notes that he will be meeting with radiation  today. He says that the only problematic symptom he's been experiencing is the fact that he's seeing double. [name] was advised that there is a reasonable chance of this symptom resolving with XRT. The patient was advised to talk to Dr. Tammi Klippel about his eyesight during his appointment today. He says that his eyesight is "rough," that he's afraid to drive, and that he can't work like this.  [name] daughter wonders if he could he file for short-term disability.  Although he was advised to limit his activity after his kidney biopsy, Mr. Conger remarks that he played pool last night. His daughter states that this is because he wants to win a free trip to [place]. He says "I don't want to see blood in my urine," but he really does want to  resume his activity, especially so he can go fishing as soon as possible.  His daughter asks for a timeline on his treatment so that she can schedule a family trip to the [place] for this coming September. She was advised that she should be in the clear to schedule this if necessary.  MEDICAL HISTORY:   Past Medical History  Diagnosis Date  . Skull lesion 10/20/2015  . Renal mass, right 10/23/2015  . Renal cell carcinoma ([place]) 10/23/2015    ALLERGIES: No Known Allergies    MEDICATIONS: I have reviewed the patient's current medications.    No current outpatient prescriptions on file prior to visit.   No current facility-administered medications on file prior to visit.     PAST SURGICAL HISTORY History reviewed. No pertinent past surgical history.  FAMILY HISTORY: Family History  Problem Relation Age of Onset  . CAD Mother   . CAD Father    Mother is alive at age 24, she has heart disease Father is alive at 24, he also has heart disease He has 2 sisters, 2 years old and a 41 year old. The eldest has multiple medical problems. His one daughter, 64 years old, who is alive and healthy. Her name is Gregory [name] History of grandchildren, both boys, both healthy.  SOCIAL HISTORY:  reports that he has been smoking Cigarettes.  He has a 45 pack-year smoking history. He does not have any smokeless tobacco history on file. He reports that he drinks about 12.0 oz of alcohol per week. He reports that  he uses illicit drugs (Marijuana) about once per week.[name]  He is not married. He is divorced 3. 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 He notes that he is a [place] but admits that he is not one to attend church.  PERFORMANCE STATUS: The patient's performance status is 1 - Symptomatic but completely ambulatory  14 point review of systems was performed and is negative except as detailed under history of present illness and above  PHYSICAL  EXAM: Most Recent Vital Signs: Blood pressure 147/91, pulse 90, temperature 98.4 F (36.9 C), temperature source Oral, resp. rate 20, weight 158 lb (71.668 kg), SpO2 100 %. General appearance: alert, appears stated age, no distress and accompanied by his daughter, Gregory [name] Head: Normocephalic, without obvious abnormality, atraumatic Eyes: positive findings: right abduction of eye. PERRLA Throat: abnormal findings: dentition: upper and lower dentures Neck: supple, symmetrical, trachea midline Lungs: clear to auscultation bilaterally and normal percussion bilaterally Heart: regular rate and rhythm, S1, S2 normal, no murmur, click, rub or gallop Abdomen: soft, non-tender; bowel sounds normal; no masses,  no organomegaly Extremities: extremities normal, atraumatic, no cyanosis or edema Pulses: 2+ and symmetric Skin: Skin color, texture, turgor normal. No rashes or lesions Lymph nodes: Cervical, supraclavicular, and axillary nodes normal. Neurologic: Alert and oriented X 3, normal strength and tone. Normal symmetric reflexes. Normal coordination and gait Reflexes: biceps reflex (C-5 to C-6) right and left 4/4 triceps reflex (C-7 to C-8) right and left 4/4 quadriceps reflex (L-2 to L-4) right and left 4/4 Achilles reflex (L-5 to S-2) right and left 4/4 pectoralis reflex (C-5 to T-1) right and left 4/4  LABORATORY DATA:  I have reviewed the data as listed.  CBC    Component Value Date/Time   WBC 10.4 11/01/2015 0700   RBC 5.94* 11/01/2015 0700   HGB 17.9* 11/01/2015 0700   HCT 52.3* 11/01/2015 0700   PLT 227 11/01/2015 0700   MCV 88.0 11/01/2015 0700   MCH 30.1 11/01/2015 0700   MCHC 34.2 11/01/2015 0700   RDW 13.5 11/01/2015 0700   LYMPHSABS 2.4 10/24/2015 1705   MONOABS 0.9 10/24/2015 1705   EOSABS 0.1 10/24/2015 1705   BASOSABS 0.1 10/24/2015 1705      Chemistry      Component Value Date/Time   NA 138 10/24/2015 1705   K 4.0 10/24/2015 1705   CL 107 10/24/2015 1705   CO2 24  10/24/2015 1705   BUN 13 10/24/2015 1705   CREATININE 0.93 10/24/2015 1705      Component Value Date/Time   CALCIUM 8.9 10/24/2015 1705   ALKPHOS 83 10/24/2015 1705   AST 15 10/24/2015 1705   ALT 16* 10/24/2015 1705   BILITOT 0.2* 10/24/2015 1705       RADIOGRAPHY: No results found.     CLINICAL DATA: Right-sided diplopia for the past month. Skullbase lesion. Right renal mass.  EXAM: MRI HEAD WITHOUT AND WITH CONTRAST  TECHNIQUE: Multiplanar, multiecho pulse sequences of the brain and surrounding structures were obtained without and with intravenous contrast.  CONTRAST: 67mL MULTIHANCE GADOBENATE DIMEGLUMINE 529 MG/ML IV SOLN  COMPARISON: Head and neck CTA 10/21/2015 and CT orbits 10/20/2015  FINDINGS: There is no evidence of acute infarct, intracranial hemorrhage, intra-axial mass, midline shift, or extra-axial fluid collection. Ventricles and sulci are normal in size for age. There is minimal nonspecific T2 hyperintensity in the periventricular white matter bilaterally. No enhancing brain lesions are seen.  As described on recent CTs, there is  a destructive right-sided central/posterior skullbase mass which measures approximately 5 cm in greatest length and approximately 2.2 cm in transverse width. As seen on CT, the mass involves the basiocciput, occipital condyle, petrous temporal bone, and clivus and extends into the posterior aspect of the right cavernous sinus as well as into the posterolateral aspect of the right sphenoid sinus. Osseous destruction and relationship to the right ICA are better evaluated on earlier CTA. There is involvement of the hypoglossal canal.  The no other suspicious skull lesions are identified. The orbits are unremarkable. There is trace right sphenoid sinus mucosal thickening, and there is trace fluid in the mastoid air cells bilaterally. Major intracranial vascular flow voids are preserved.  IMPRESSION: 1. Destructive  right central/posterior skullbase mass consistent with metastasis as previously described. 2. No other evidence of intracranial metastases.   Electronically Signed  By: Logan Bores M.D.  On: 10/21/2015 13:39  CLINICAL DATA:  VIth nerve palsy.  Symptoms for 1-2 months.   EXAM: CT ANGIOGRAPHY HEAD AND NECK   TECHNIQUE: Multidetector CT imaging of the head and neck was performed using the standard protocol during bolus administration of intravenous contrast. Multiplanar CT image reconstructions and MIPs were obtained to evaluate the vascular anatomy. Carotid stenosis measurements (when applicable) are obtained utilizing NASCET criteria, using the distal internal carotid diameter as the denominator.   CONTRAST:  152mL OMNIPAQUE IOHEXOL 350 MG/ML SOLN   COMPARISON:  CT head performed 10/20/2015. CT of the chest abdomen and pelvis reported separately.   FINDINGS: CT HEAD   Calvarium and skull base: Destructive mass at the RIGHT skull base redemonstrated. The medial clivus is destroyed and the mass projects into the RIGHT cavernous sinus and sphenoid sinus. The mass involves the RIGHT occipital condyle. There is involvement of the RIGHT hypoglossal foramen. Approximate cross-section measurements are greater than appreciated on noncontrast CT, approximately 15 mm transverse, 49 mm anterior-posterior, and 15 mm craniocaudal. The mass demonstrates fairly avid postcontrast enhancement, particularly at its margins suggesting a vascular tumor. Mastoids and middle ears are grossly clear.   Paranasal sinuses: Imaged portions are clear.   Orbits: Negative.   Brain: No evidence of acute infarct, hemorrhage, hydrocephalus, or intra-axial mass lesion. There is slight extension of the skull base lesion into the prepontine cistern and inferior cerebellopontine angle on the RIGHT. No displacement of the brainstem or cerebellum. Tumor extension into the inferior cavernous sinus on the  RIGHT, correlates with the VIth nerve palsy.   CTA NECK   Aortic arch: Standard branching. Imaged portion shows no evidence of aneurysm or dissection. No significant stenosis of the major arch vessel origins.   Right carotid system: No evidence of dissection, stenosis (50% or greater) or occlusion.   Left carotid system: No evidence of dissection, stenosis (50% or greater) or occlusion.   Vertebral arteries: Codominant. No evidence of dissection, stenosis (50% or greater) or occlusion.   Other: Minor spondylosis. RIGHT TMJ arthropathy. No cervical spine or upper thoracic spine metastases. Central protrusion C5-6 and C6-7. Cervical facet arthropathy. Chest findings reported separately.   CTA HEAD   Anterior circulation: LEFT ICA is normal. RIGHT ICA demonstrates lateral displacement in its petrous and inferior cavernous segments by the vascular skullbase mass. No evidence for aneurysm or dissection. No significant intracranial stenosis, proximal occlusion, aneurysm, or vascular malformation. Mild calcific atheromatous change.   Posterior circulation: Unremarkable. No significant stenosis, proximal occlusion, aneurysm, or vascular malformation.   Venous sinuses: As permitted by contrast timing, patent.   Anatomic variants: Fetal  RIGHT PCA.   Delayed phase:  No visible brain metastases.   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 There is involvement of the medial clivus, occipital condyle, petrous apex, with extension into the RIGHT cavernous sinus and sphenoid sinus. Intracranial extension into the ventral epidural space but no intra-axial lesions. Given the appearance of the RIGHT kidney, metastatic renal cell carcinoma is favored.   Extrinsic mass effect on the petrous and inferior cavernous RIGHT internal carotid artery without evidence for aneurysm.   No flow reducing intracranial or extracranial stenosis.       Electronically Signed   By: Staci Righter M.D.   On: 10/21/2015 13:14    CLINICAL DATA:  Skullbase lesion concerning for metastatic disease. Lesion discovered on recent head CT.   EXAM: CT CHEST, ABDOMEN, AND PELVIS WITH CONTRAST   TECHNIQUE: Multidetector CT imaging of the chest, abdomen and pelvis was performed following the standard protocol during bolus administration of intravenous contrast.   CONTRAST:  65mL OMNIPAQUE IOHEXOL 300 MG/ML SOLN, 148mL OMNIPAQUE IOHEXOL 350 MG/ML SOLN   COMPARISON:  Head CT 10/20/2015   FINDINGS: CT CHEST FINDINGS   Mediastinum/Nodes: No axillary or supraclavicular lymphadenopathy. No mediastinal hilar lymphadenopathy. No pericardial fluid. Esophagus normal.   Lungs/Pleura: Flattened nodule along the RIGHT oblique fissure measures 7 mm (images 35, series 6).   Musculoskeletal: No aggressive osseous lesion.   CT ABDOMEN AND PELVIS FINDINGS   Hepatobiliary: No focal hepatic lesion. No biliary ductal dilatation. Gallbladder is normal. Common bile duct is normal.   Pancreas: Pancreas is normal. No ductal dilatation. No pancreatic inflammation.   Spleen: Normal spleen   Adrenals/urinary tract: Adrenal glands are normal.   Large irregular peripherally enhancing rounded mass extends from the mid upper RIGHT kidney measuring 5.6 x 5.7 cm in axial dimension and 6.2 cm in craniocaudad dimension. This RIGHT renal mass is contained within the para renal fascia. The mass extends into the RIGHT renal hilum. No evidence of involvement of the RIGHT renal vein. IVC is normal.   No retroperitoneal lymphadenopathy.   Small nonobstructing calculus in the lower pole of the LEFT kidney. Low-attenuation the rounded lesion in the lower pole LEFT kidney with simple fluid attenuation is most consistent with benign renal cysts.   The ureters and bladder normal.   Stomach/Bowel: Stomach, small bowel, appendix, and cecum are normal. The colon and  rectosigmoid colon are normal. Moderate volume stool in the rectum. No obstructing lesion.   Vascular/Lymphatic: Abdominal aorta is normal caliber with atherosclerotic calcification. There is no retroperitoneal or periportal lymphadenopathy. No pelvic lymphadenopathy.   Reproductive: Prostate biopsy.   Other: No peritoneal disease.   Musculoskeletal: Sclerotic lesion in the proximal RIGHT femur measures 15 mm on image 123, series 2   IMPRESSION: Chest Impression:   1. No evidence thoracic metastasis. 2. Nodule along the RIGHT oblique fissure likely represents a benign intrafissural lymph node 3. No mediastinal lymphadenopathy   Abdomen / Pelvis Impression:   1. Enlarged irregular enhancing mass partially exophytic from the RIGHT kidney consistent with RENAL CELL CARCINOMA. 2. No evidence of involvement of the RIGHT renal vein. 3. No retroperitoneal lymphadenopathy 4. RIGHT renal mass is contained within the para renal fascia. 5. Sclerotic lesion within the intertrochanteric proximal RIGHT femur is indeterminate. With skullbase lesion described on comparison CT, metastasis is a concern. These results will be called to the ordering clinician or representative by the Radiologist Assistant, and communication documented in the PACS or zVision Dashboard.  Electronically Signed   By: Suzy Bouchard M.D.   On: 10/21/2015 12:54    PATHOLOGY  :        ASSESSMENT/PLAN:  Stage IV RCC Destructive lesion at the RIGHT skull base on MRI imaging R VIth cranial nerve palsy Renal mass, right Tobacco Use Polycythemia  Metastatic RCC. Unfortunately no way to biopsy skull base lesion. Patient is scheduled to see Dr. Tammi Klippel today for consultation and for simulation.  Role of cytoreductive nephrectomy will be discussed in the future. Plan is to start either sutent or votrient post XRT.   I addressed the importance of smoking cessation with the patient in detail.  We  discussed the health benefits of cessation.  We discussed the health detriments of ongoing tobacco use including but not limited to COPD, heart disease and malignancy. We reviewed the multiple options for cessation and I offered to refer him to smoking cessation classes. We discussed other alternatives to quit such as chantix, wellbutrin. We will continue to address this moving forward.  His polycythemia is either secondary to his RCC or smoking. We will obtain an erythropoietin level at return.  He was given a booklet of material on renal cell carcinoma at the end of his appointment.  We will see him back around the 25th. He will meet with Hildred Alamin in between, to discuss his medication.  Orders Placed This Encounter  Procedures  . CBC with Differential    Standing Status: Future     Number of Occurrences:      Standing Expiration Date: 11/03/2016  . Comprehensive metabolic panel    Standing Status: Future     Number of Occurrences:      Standing Expiration Date: 11/03/2016    All questions were answered. The patient knows to call the clinic with any problems, questions or concerns. We can certainly see the patient much sooner if necessary.  This document serves as a record of services personally performed by Ancil Linsey, MD. It was created on her behalf by Toni Amend, a trained medical scribe. The creation of this record is based on the scribe's personal observations and the provider's statements to them. This document has been checked and approved by the attending provider.  I have reviewed the above documentation for accuracy and completeness and I agree with the above.  This note is electronically signed by: Molli Hazard, MD  11/04/2015 9:22 AM

## 2015-11-04 NOTE — Patient Instructions (Addendum)
[place] at [facility] Discharge Instructions  RECOMMENDATIONS MADE BY THE CONSULTANT AND ANY TEST RESULTS WILL BE SENT TO YOUR REFERRING PHYSICIAN.    Exam and discussion by Dr Whitney Muse today You can go fishing this weekend.  Just be smart about it. The biopsy showed kidney cancer. You are going to meet with Dr Tammi Klippel for radiation  You will be started on a medication (sutent), you will take this medication for a few months, then get scans, then we will refer to a urologist (Dr Alinda Money) Hildred Alamin will do the teaching on the drug.  Return to see the doctor on March 25 with labs  Please call the clinic if you have any questions or concerns      Sunitinib oral capsules What is this medicine? SUNITINIB (soo NI ti nib) is a medicine that targets proteins in cancer cells and stops the cancer cells from growing. It is used to treat specific digestive tract tumors called GISTs, advanced kidney cancer, and certain pancreatic neuroendocrine tumors. This medicine may be used for other purposes; ask your health care provider or pharmacist if you have questions. What should I tell my health care provider before I take this medicine? They need to know if you have any of these conditions: -bleeding problems -dental disease -infection (especially a virus infection such as chickenpox, cold sores, or herpes) -heart disease -heart failure -high blood pressure -kidney disease (other than cancer) -liver disease -lung disease -seizures -an unusual or allergic reaction to sunitinib, other medicines, foods, dyes, or preservatives -pregnant or trying to get pregnant -breast-feeding How should I use this medicine? Take this medicine by mouth with a glass of water. Follow the directions on the prescription label. You can take it with or without food. Take your medicine at regular intervals. Do not take your medicine more often than directed. Do not stop taking except on your  doctor's advice. Talk to your pediatrician regarding the use of this medicine in children. Special care may be needed. Overdosage: If you think you have taken too much of this medicine contact a poison control center or emergency room at once. NOTE: This medicine is only for you. Do not share this medicine with others. What if I miss a dose? If you miss a dose, take it as soon as you can. If it is almost time for your next dose, take only that dose. Do not take double or extra doses. Tell your doctor if you miss a dose. What may interact with this medicine? Do not take this medicine with any of the following medications: -certain medicines for fungal infections like fluconazole, itraconazole, ketoconazole, posaconazole, voriconazole -cisapride -dofetilide -dronedarone -grapefruit juice -pimozide -St. John's Wort -thioridazine -ziprasidone This medicine may also interact with the following medications: -antiviral medicines for HIV or AIDS -barbiturates like phenobarbital -carbamazepine -certain antibiotics like clarithromycin, erythromycin, levofloxacin, mefloquine, telithromycin, rifabutin, rifampin, rifapentine -certain medicines for depression, anxiety, or psychotic disturbances -certain medicines for irregular heart beat like amiodarone, bepridil, encainide, flecainide, propafenone, quinidine -certain medicines for numbness or sleep during surgery -dexamethasone -other medicines that prolong the QT interval (cause an abnormal heart rhythm) -phenytoin This list may not describe all possible interactions. Give your health care provider a list of all the medicines, herbs, non-prescription drugs, or dietary supplements you use. Also tell them if you smoke, drink alcohol, or use illegal drugs. Some items may interact with your medicine. What should I watch for while using this medicine? Visit your doctor for regular  check ups. Talk to your doctor about any new or unusual health problems.  You will need blood work done while you are taking this medicine. If you have any dental work done, tell your dentist you are receiving this medicine. Do not become pregnant while taking this medicine. Male and male patients should use effective birth control methods while taking this medicine. Women should inform their doctor if they wish to become pregnant or think they might be pregnant. There is a potential for serious side effects to an unborn child. Talk to your health care professional or pharmacist for more information. Do not breast-feed an infant while taking this medicine. What side effects may I notice from receiving this medicine? Side effects that you should report to your doctor or health care professional as soon as possible: -allergic reactions like skin rash, itching or hives, swelling of the face, lips, or tongue -breathing problems -confusion -changes in vision -dark urine -feeling faint or lightheaded, falls -fever or chills, cough, sore throat -high blood pressure -jaw pain, especially after dental work -mouth sores -seizures -stomach pain -swelling of feet, legs -trouble passing urine or change in the amount of urine -unusual bleeding or bruising -unusually weak or tired Side effects that usually do not require medical attention (report to your doctor or health care professional if they continue or are bothersome): -bone or muscle pain -change in hair color (lighter) -changes in taste -diarrhea -loss of appetite -nausea, vomiting -skin that is cracked, dry, thick, yellow or lightened -stomach upset This list may not describe all possible side effects. Call your doctor for medical advice about side effects. You may report side effects to FDA at 1-800-FDA-1088. Where should I keep my medicine? Keep out of the reach of children. Store at room temperature between 15 and 30 degrees C (59 and 86 degrees F). Throw away any unused medicine after the expiration  date. NOTE: This sheet is a summary. It may not cover all possible information. If you have questions about this medicine, talk to your doctor, pharmacist, or health care provider.    2016, Elsevier/Gold Standard. (2014-10-27 22:19:15)      Thank you for choosing [place] at [facility] to provide your oncology and hematology care.  To afford each patient quality time with our provider, please arrive at least 15 minutes before your scheduled appointment time.   Beginning January 23rd 2017 lab work for the [facility] will be done in the  Main lab at [facility] on 1st floor. If you have a lab appointment with the [facility] please come in thru the  Main Entrance and check in at the main information desk  You need to re-schedule your appointment should you arrive 10 or more minutes late.  We strive to give you quality time with our providers, and arriving late affects you and other patients whose appointments are after yours.  Also, if you no show three or more times for appointments you may be dismissed from the clinic at the providers discretion.     Again, thank you for choosing [facility].  Our hope is that these requests will decrease the amount of time that you wait before being seen by our physicians.       _____________________________________________________________  Should you have questions after your visit to [facility], please contact our office at [phone] between the hours of 8:30 a.m. and 4:30 p.m.  Voicemails left after 4:30 p.m. will  not be returned until the following business day.  For prescription refill requests, have your pharmacy contact our office.         Resources For Cancer Patients and their Caregivers ? American Cancer Society: Can assist with transportation, wigs, general needs, runs Look Good Feel Better.        939-758-9919 ? Cancer Care: Provides financial assistance, online support  groups, medication/co-pay assistance.  1-800-813-HOPE (640) 758-9312) ? [place] Assists [place] cancer patients and their families through emotional , educational and financial support.  (620) 203-6960 ? [place] DSS Where to apply for food stamps, Medicaid and utility assistance. (743) 022-7871 ? [employer]: Transportation to medical appointments. 913-381-6311 ? Social Security Administration: May apply for disability if have a Stage IV cancer. 541-229-6204 (986) 537-7990 ? [employer], Disability and Transit Services: Assists with nutrition, care and transit needs. 308-526-4487

## 2015-11-06 DIAGNOSIS — C649 Malignant neoplasm of unspecified kidney, except renal pelvis: Secondary | ICD-10-CM | POA: Insufficient documentation

## 2015-11-06 DIAGNOSIS — C7951 Secondary malignant neoplasm of bone: Secondary | ICD-10-CM | POA: Insufficient documentation

## 2015-11-06 DIAGNOSIS — H4920 Sixth [abducent] nerve palsy, unspecified eye: Secondary | ICD-10-CM | POA: Insufficient documentation

## 2015-11-07 ENCOUNTER — Other Ambulatory Visit (HOSPITAL_COMMUNITY): Payer: Self-pay | Admitting: *Deleted

## 2015-11-07 DIAGNOSIS — C7951 Secondary malignant neoplasm of bone: Secondary | ICD-10-CM

## 2015-11-07 DIAGNOSIS — Z510 Encounter for antineoplastic radiation therapy: Secondary | ICD-10-CM | POA: Diagnosis not present

## 2015-11-07 DIAGNOSIS — C641 Malignant neoplasm of right kidney, except renal pelvis: Secondary | ICD-10-CM

## 2015-11-07 MED ORDER — SUNITINIB MALATE 50 MG PO CAPS: 50.0000 mg | ORAL_CAPSULE | Freq: Every day | ORAL | Status: DC

## 2015-11-07 MED ORDER — SUNITINIB MALATE 50 MG PO CAPS: ORAL_CAPSULE | ORAL | Status: DC

## 2015-11-09 DIAGNOSIS — Z510 Encounter for antineoplastic radiation therapy: Secondary | ICD-10-CM | POA: Diagnosis not present

## 2015-11-11 ENCOUNTER — Telehealth (HOSPITAL_COMMUNITY): Payer: Self-pay | Admitting: Hematology & Oncology

## 2015-11-11 NOTE — Telephone Encounter (Signed)
PC TO [facility] [phone] SPK WITH CSR TO OBTAIN BENEFITS AND RX INFO $350 DED/1900 OOP/ PLAN PAYS 85/15 CONTACT [employer] @ [phone] CONTACT [facility] FOR PHARMACY INFO [phone] CALL REF# [id number]  CALLED [facility] TO GIVE THEM RX INFO. PTS SUTENT RX IS STILL PENDING RX VERIFICATION

## 2015-11-14 ENCOUNTER — Encounter: Payer: Self-pay | Admitting: Radiation Oncology

## 2015-11-14 ENCOUNTER — Ambulatory Visit
Admission: RE | Admit: 2015-11-14 | Discharge: 2015-11-14 | Disposition: A | Discharge status: Home / Self Care | Payer: BLUE CROSS/BLUE SHIELD | Source: Ambulatory Visit | Attending: Radiation Oncology | Admitting: Radiation Oncology

## 2015-11-14 VITALS — BP 120/90 | HR 78 | Temp 98.4°F

## 2015-11-14 DIAGNOSIS — Z510 Encounter for antineoplastic radiation therapy: Secondary | ICD-10-CM | POA: Diagnosis not present

## 2015-11-14 DIAGNOSIS — C7951 Secondary malignant neoplasm of bone: Secondary | ICD-10-CM

## 2015-11-14 NOTE — Progress Notes (Signed)
  Radiation Oncology         [phone]) [phone] ________________________________  Stereotactic Treatment Procedure Note  Name: Gregory [name]. MRN: [id number]  Date: 11/14/2015  DOB: [birth year]  SPECIAL TREATMENT PROCEDURE    ICD-9-CM ICD-10-CM   1. Bone metastases ([place]) 198.5 C79.51      FRACTION: 1/5  3D TREATMENT PLANNING AND DOSIMETRY:  The patient's radiation plan was reviewed and approved by neurosurgery and radiation oncology prior to treatment.  It showed 3-dimensional radiation distributions overlaid onto the planning CT/MRI image set.  The [facility] for the target structures as well as the organs at risk were reviewed. The documentation of the 3D plan and dosimetry are filed in the radiation oncology EMR.  NARRATIVE:  Gregory [name]. was brought to the TrueBeam stereotactic radiation treatment machine and placed supine on the CT couch. The head frame was applied, and the patient was set up for stereotactic radiosurgery.  Neurosurgery was present for the set-up and delivery  SIMULATION VERIFICATION:  In the couch zero-angle position, the patient underwent Exactrac imaging using the Brainlab system with orthogonal KV images.  These were carefully aligned and repeated to confirm treatment position for each of the isocenters.  The Exactrac snap film verification was repeated at each couch angle.  PROCEDURE: Gregory [name]. received stereotactic radiosurgery to the following targets: Right Skull Base target was treated using 4 Rapid Arc VMAT Beams to a prescription dose of 5 Gy to be repeated until prescription dose of 25 Gy. [name]  ExacTrac registration was performed for each couch angle.  The 100% isodose line was prescribed.  6 MV X-rays were delivered in the flattening filter free beam mode.   STEREOTACTIC TREATMENT MANAGEMENT:  Following delivery, the patient was transported to nursing in stable condition and monitored for possible acute effects.  Vital signs were  recorded BP 120/90 mmHg  Pulse 78  Temp(Src) 98.4 F (36.9 C) (Oral)  SpO2 98%. The patient tolerated treatment without significant acute effects, and was discharged to home in stable condition.    PLAN: Complete 5 fractions and follow-up in one month.  ________________________________  Sheral Apley. Tammi Klippel, M.D.   This document serves as a record of services personally performed by Tyler Pita, MD. It was created on his behalf by Arlyce Harman, a trained medical scribe. The creation of this record is based on the scribe's personal observations and the provider's statements to them. This document has been checked and approved by the attending provider.

## 2015-11-14 NOTE — Progress Notes (Addendum)
Patient here for post [facility] monitoring.  He denies having a headache, nausea or balance issues.  He reports having double vision when he looks at something "straight on" which he has had since his diagnosis.  Patient advised to call with any questions or concerns.  Escorted him to the elevator to meet his family.

## 2015-11-14 NOTE — Op Note (Signed)
Name: Gregory [name].    MRN: [id number]   Date: 11/14/2015    DOB: 1956/01/13   STEREOTACTIC RADIOSURGERY OPERATIVE NOTE  PRE-OPERATIVE DIAGNOSIS:  Metastatic Renal Cell Carcinoma  POST-OPERATIVE DIAGNOSIS:  Same  PROCEDURE:  Stereotactic Radiosurgery  SURGEON:  Consuella Lose, MD  RADIATION ONCOLOGIST: Dr. Tyler Pita, MD  TECHNIQUE:  The patient underwent a radiation treatment planning session in the radiation oncology simulation suite under the care of the radiation oncology physician and physicist.  I participated closely in the radiation treatment planning afterwards. The patient underwent planning CT which was fused to 3T high resolution MRI with 1 mm axial slices.  These images were fused on the planning system.  Radiation oncology contoured the gross target volume and subsequently expanded this to yield the Planning Target Volume. I actively participated in the planning process.  I helped to define and review the target contours and also the contours of the optic pathway, eyes, brainstem and selected nearby organs at risk.  All the dose constraints for critical structures were reviewed and compared to AAPM Task Group 101.  The prescription dose conformity was reviewed.  I approved the plan electronically.    Accordingly, Gregory [name]. was brought to the TrueBeam stereotactic radiation treatment linac and placed in the custom immobilization mask.  The patient was aligned according to the IR fiducial markers with BrainLab Exactrac, then orthogonal x-rays were used in ExacTrac with the 6DOF robotic table and the shifts were made to align the patient  This lesion was complex because it was 3.5 cm or greater, adjacent (within 5mm) of the optic nerve and optic chasm.   Gregory [name]. received stereotactic radiosurgery uneventfully for the first of five planned fractions, with a planned total prescription dose of 25Gy.  The detailed description of the  procedure is recorded in the radiation oncology procedure note.  I was present for the duration of the procedure.  DISPOSITION:  Following delivery, the patient was transported to nursing in stable condition and monitored for possible acute effects to be discharged to home in stable condition with follow-up in one month.  Consuella Lose, MD [facility]

## 2015-11-15 ENCOUNTER — Telehealth (HOSPITAL_COMMUNITY): Payer: Self-pay | Admitting: *Deleted

## 2015-11-15 NOTE — Telephone Encounter (Signed)
Pt's daughter called back and asked me to call her at her work number. I called and got vm. I left a message on Starr's vm regarding her dad going for a 2nd opinion. I told her that I would not be back tomorrow 11/16/15 and to call and ask for Anderson Malta if she had any questions. Dr. Whitney Muse wanted [place] to cancel the Sutent because if pt is going for a 2nd opinion the doctors @ [facility] may want pt to be on a different therapy.

## 2015-11-16 ENCOUNTER — Ambulatory Visit
Admission: RE | Admit: 2015-11-16 | Discharge: 2015-11-16 | Disposition: A | Discharge status: Home / Self Care | Payer: BLUE CROSS/BLUE SHIELD | Source: Ambulatory Visit | Attending: Radiation Oncology | Admitting: Radiation Oncology

## 2015-11-16 ENCOUNTER — Telehealth: Payer: Self-pay | Admitting: *Deleted

## 2015-11-16 ENCOUNTER — Telehealth (HOSPITAL_COMMUNITY): Payer: Self-pay | Admitting: Emergency Medicine

## 2015-11-16 VITALS — BP 129/90 | HR 79 | Temp 98.3°F | Resp 16

## 2015-11-16 DIAGNOSIS — Z510 Encounter for antineoplastic radiation therapy: Secondary | ICD-10-CM | POA: Diagnosis not present

## 2015-11-16 DIAGNOSIS — C641 Malignant neoplasm of right kidney, except renal pelvis: Secondary | ICD-10-CM

## 2015-11-16 DIAGNOSIS — M899 Disorder of bone, unspecified: Secondary | ICD-10-CM

## 2015-11-16 NOTE — Telephone Encounter (Signed)
CALLED PATIENT TO INFORM OF APPT. WITH DR. Rocks Gave Gregory [name] ([place]) ON 11-29-15- ARRIVAL TIME - 8:15 AM, SPOKE WITH PATIENT AND HE IS AWARE OF THIS APPT.

## 2015-11-16 NOTE — Progress Notes (Addendum)
Vitals stable. Denies pain. Reports daily headaches continue. Reports diplopia continues. Denies dizziness, nausea, or ringing in the ears. Patient understands to avoid strenuous activity for the next 24 hours. Also, patient understands to call [phone] for needs.

## 2015-11-16 NOTE — Telephone Encounter (Signed)
Spoke with starr pts daughter and told her that Dr Tammi Klippel was making the referral to [facility], i am canceling the appt on Friday if they are going for a second opinion.

## 2015-11-17 ENCOUNTER — Telehealth (HOSPITAL_COMMUNITY): Payer: Self-pay | Admitting: *Deleted

## 2015-11-17 NOTE — Telephone Encounter (Signed)
I called and cancelled the Sutent prescription today. Rep from [facility] stated that he received it today or was supposed to receive it today. I told her that the entire RX needed to be discarded and that the patient was going to a different doctor for treatment. She said ok.

## 2015-11-18 ENCOUNTER — Other Ambulatory Visit: Payer: Self-pay | Admitting: Radiation Oncology

## 2015-11-18 ENCOUNTER — Ambulatory Visit
Admission: RE | Admit: 2015-11-18 | Discharge: 2015-11-18 | Disposition: A | Discharge status: Home / Self Care | Payer: BLUE CROSS/BLUE SHIELD | Source: Ambulatory Visit | Attending: Radiation Oncology | Admitting: Radiation Oncology

## 2015-11-18 DIAGNOSIS — Z510 Encounter for antineoplastic radiation therapy: Secondary | ICD-10-CM | POA: Diagnosis not present

## 2015-11-18 MED ORDER — PROCHLORPERAZINE MALEATE 10 MG PO TABS: 10.0000 mg | ORAL_TABLET | Freq: Four times a day (QID) | ORAL | Status: DC | PRN

## 2015-11-18 NOTE — Progress Notes (Signed)
  Radiation Oncology         [phone]) [phone] ________________________________  Stereotactic Treatment Procedure Note  Name: Gregory [name]. MRN: [id number]  Date: 11/16/2015  DOB: 1956-04-26  SPECIAL TREATMENT PROCEDURE    ICD-9-CM ICD-10-CM   1. Renal cell carcinoma, right ([place]) 189.0 C64.1 Ambulatory referral to Hematology / Oncology  2. Skull lesion 733.90 M89.9      FRACTION: 2/5  3D TREATMENT PLANNING AND DOSIMETRY:  The patient's radiation plan was reviewed and approved by neurosurgery and radiation oncology prior to treatment.  It showed 3-dimensional radiation distributions overlaid onto the planning CT/MRI image set.  The [facility] for the target structures as well as the organs at risk were reviewed. The documentation of the 3D plan and dosimetry are filed in the radiation oncology EMR.  NARRATIVE:  Gregory Alberts. was brought to the TrueBeam stereotactic radiation treatment machine and placed supine on the CT couch. The head frame was applied, and the patient was set up for stereotactic radiosurgery.  Neurosurgery was present for the set-up and delivery  SIMULATION VERIFICATION:  In the couch zero-angle position, the patient underwent Exactrac imaging using the Brainlab system with orthogonal KV images.  These were carefully aligned and repeated to confirm treatment position for each of the isocenters.  The Exactrac snap film verification was repeated at each couch angle.  PROCEDURE: Gregory Alberts. received stereotactic radiosurgery to the following targets: Right Skull Base target was treated using 4 Rapid Arc VMAT Beams to a prescription dose of 5 Gy to be repeated until prescription dose of 25 Gy. [name]  ExacTrac registration was performed for each couch angle.  The 100% isodose line was prescribed.  6 MV X-rays were delivered in the flattening filter free beam mode.   STEREOTACTIC TREATMENT MANAGEMENT:  Following delivery, the patient was transported to  nursing in stable condition and monitored for possible acute effects.  Vital signs were recorded BP 129/90 mmHg  Pulse 79  Temp(Src) 98.3 F (36.8 C) (Oral)  Resp 16  SpO2 100%. The patient tolerated treatment without significant acute effects, and was discharged to home in stable condition.    PLAN: Complete 5 fractions and follow-up in one month.  ________________________________  Sheral Apley. Tammi Klippel, M.D.   This document serves as a record of services personally performed by Tyler Pita, MD. It was created on his behalf by Arlyce Harman, a trained medical scribe. The creation of this record is based on the scribe's personal observations and the provider's statements to them. This document has been checked and approved by the attending provider.

## 2015-11-18 NOTE — Progress Notes (Signed)
Vitals stable. Denies pain. Reports daily headaches continue for which he takes Goody powder. Reports diplopia continues. Denies dizziness or ringing in the ears. Reports nausea. Compazine escribed by Shona Simpson, PA to [place], Linna Hoff. Patient understands to avoid strenuous activity for the next 24 hours. Also, patient understands to call [phone] for needs.   Phoned patient at 28. He picked up Compazine script and confirms this is managing his nausea well thus, far. Understands to contact this RN with future needs.

## 2015-11-21 ENCOUNTER — Ambulatory Visit
Admission: RE | Admit: 2015-11-21 | Discharge: 2015-11-21 | Disposition: A | Discharge status: Home / Self Care | Payer: BLUE CROSS/BLUE SHIELD | Source: Ambulatory Visit | Attending: Radiation Oncology | Admitting: Radiation Oncology

## 2015-11-21 VITALS — BP 129/92 | HR 90 | Temp 98.4°F | Resp 16

## 2015-11-21 DIAGNOSIS — Z510 Encounter for antineoplastic radiation therapy: Secondary | ICD-10-CM | POA: Diagnosis not present

## 2015-11-21 DIAGNOSIS — C7951 Secondary malignant neoplasm of bone: Secondary | ICD-10-CM

## 2015-11-21 NOTE — Progress Notes (Signed)
  Radiation Oncology         [phone]) [phone] ________________________________  Stereotactic Treatment Procedure Note  Name: Gregory [name]. MRN: [id number]  Date: 11/21/2015  DOB: [birth year]  SPECIAL TREATMENT PROCEDURE    ICD-9-CM ICD-10-CM   1. Bone metastases ([place]) 198.5 C79.51       3D TREATMENT PLANNING AND DOSIMETRY:  The patient's radiation plan was reviewed and approved by neurosurgery and radiation oncology prior to treatment.  It showed 3-dimensional radiation distributions overlaid onto the planning CT/MRI image set.  The [facility] for the target structures as well as the organs at risk were reviewed. The documentation of the 3D plan and dosimetry are filed in the radiation oncology EMR.  NARRATIVE:  Gregory Alberts. was brought to the TrueBeam stereotactic radiation treatment machine and placed supine on the CT couch. The head frame was applied, and the patient was set up for stereotactic radiosurgery.    SIMULATION VERIFICATION:  In the couch zero-angle position, the patient underwent Exactrac imaging using the Brainlab system with orthogonal KV images.  These were carefully aligned and repeated to confirm treatment position for each of the isocenters.  The Exactrac snap film verification was repeated at each couch angle.  PROCEDURE: Gregory Alberts. received stereotactic radiosurgery to the following targets: Right Skull Base target was treated using 4 Rapid Arc VMAT Beams to a prescription dose of 5 Gy to be repeated until prescription dose of 25 Gy. [name]  ExacTrac registration was performed for each couch angle.  The 100% isodose line was prescribed.  6 MV X-rays were delivered in the flattening filter free beam mode.   STEREOTACTIC TREATMENT MANAGEMENT:  Following delivery, the patient was transported to nursing in stable condition and monitored for possible acute effects.  Vital signs were recorded BP 129/92 mmHg  Pulse 90  Temp(Src) 98.4 F (36.9 C) (Oral)   Resp 16  SpO2 100%. The patient tolerated treatment without significant acute effects, and was discharged to home in stable condition.    PLAN: Complete 5 fractions and follow-up in one month.   -----------------------------------  Eppie Gibson, MD

## 2015-11-21 NOTE — Progress Notes (Signed)
Vitals stable. Denies pain. Reports daily headaches continue for which he takes Goody powder. Reports diplopia continues. Denies dizziness or ringing in the ears. Reports nausea resolved with Compazine. Patient understands to avoid strenuous activity for the next 24 hours. Also, patient understands to call [phone] for needs.

## 2015-11-23 ENCOUNTER — Encounter: Payer: Self-pay | Admitting: Radiation Oncology

## 2015-11-23 ENCOUNTER — Ambulatory Visit
Admission: RE | Admit: 2015-11-23 | Discharge: 2015-11-23 | Disposition: A | Discharge status: Home / Self Care | Payer: BLUE CROSS/BLUE SHIELD | Source: Ambulatory Visit | Attending: Radiation Oncology | Admitting: Radiation Oncology

## 2015-11-23 VITALS — BP 154/106 | HR 76 | Temp 98.5°F | Resp 20

## 2015-11-23 DIAGNOSIS — Z510 Encounter for antineoplastic radiation therapy: Secondary | ICD-10-CM | POA: Diagnosis not present

## 2015-11-23 DIAGNOSIS — C7951 Secondary malignant neoplasm of bone: Secondary | ICD-10-CM

## 2015-11-23 NOTE — Progress Notes (Signed)
  Radiation Oncology         [phone]) [phone] ________________________________  Stereotactic Treatment Procedure Note  Name: Gregory [name]. MRN: [id number]  Date: 11/23/2015  DOB: [birth year]  SPECIAL TREATMENT PROCEDURE    ICD-9-CM ICD-10-CM   1. Bone metastases ([place]) 198.5 C79.51       3D TREATMENT PLANNING AND DOSIMETRY:  The patient's radiation plan was reviewed and approved by neurosurgery and radiation oncology prior to treatment.  It showed 3-dimensional radiation distributions overlaid onto the planning CT/MRI image set.  [facility] for the target structures as well as the organs at risk were reviewed. The documentation of the 3D plan and dosimetry are filed in the radiation oncology EMR.  NARRATIVE:  Gregory [name]. was brought to the TrueBeam stereotactic radiation treatment machine and placed supine on the CT couch. The head frame was applied, and the patient was set up for stereotactic radiosurgery.    SIMULATION VERIFICATION:  In the couch zero-angle position, the patient underwent Exactrac imaging using the Brainlab system with orthogonal KV images.  These were carefully aligned and repeated to confirm treatment position for each of the isocenters.  The Exactrac snap film verification was repeated at each couch angle.  PROCEDURE: Gregory [name]. received stereotactic radiosurgery to the following targets: Right Skull Base target was treated using 4 Rapid Arc VMAT Beams to a prescription dose of 5 Gy to be repeated until prescription dose of 25 Gy. [name]  ExacTrac registration was performed for each couch angle.  The 100% isodose line was prescribed.  6 MV X-rays were delivered in the flattening filter free beam mode.   STEREOTACTIC TREATMENT MANAGEMENT:  Following delivery, the patient was transported to nursing in stable condition and monitored for possible acute effects. Denied headache. Diastolic BP slightly higher than usual.  Vital signs were recorded BP  154/106 mmHg  Pulse 76  Temp(Src) 98.5 F (36.9 C) (Oral)  Resp 20  SpO2 100%. The patient tolerated treatment without significant acute effects, and was discharged to home in stable condition.    PLAN: After completion of 5 fractions, Gregory [name] is to follow-up in one month.   -----------------------------------  Eppie Gibson, MD

## 2015-11-23 NOTE — Progress Notes (Addendum)
Patient ambulated without difficulty steady gait, s/p SRS Skull, no c/o nausea ,took goody powder this am, headache still but some better, appetite good, drank pepsi,, drinks boost , juices, will wait 15 minutes, has a driver Took left arm b/p=154/106,P=76,RR=20,T=98.5 Right arm B/P=143/81, 78, no driving today,patient aware,  12:21 PM BP 154/106 mmHg  Pulse 76  Temp(Src) 98.5 F (36.9 C) (Oral)  Resp 20  SpO2 100% left arm B/p right arm=143/81,P=78,  Showed vitals to Dr. Isidore Moos, okay to d/c home per MD 12:28 PM

## 2015-11-25 ENCOUNTER — Ambulatory Visit (HOSPITAL_COMMUNITY): Payer: BLUE CROSS/BLUE SHIELD | Admitting: Hematology & Oncology

## 2015-11-25 ENCOUNTER — Other Ambulatory Visit (HOSPITAL_COMMUNITY): Payer: BLUE CROSS/BLUE SHIELD

## 2015-11-28 ENCOUNTER — Ambulatory Visit (HOSPITAL_COMMUNITY): Payer: BLUE CROSS/BLUE SHIELD | Admitting: Hematology & Oncology

## 2015-11-28 ENCOUNTER — Other Ambulatory Visit (HOSPITAL_COMMUNITY): Payer: BLUE CROSS/BLUE SHIELD

## 2015-12-02 NOTE — Progress Notes (Signed)
  Radiation Oncology         [phone]) [phone] ________________________________  Name: Gregory [name]. MRN: [id number]  Date: 11/23/2015  DOB: Dec 17, 1955  End of Treatment Note    ICD-9-CM ICD-10-CM   1. Bone metastases (HCC) 198.5 C79.51   2. 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     Indication for treatment:  Curative       Radiation treatment dates:   11/14/2015-11/23/2015  Site/dose/beams/energy:  Right Skull Base target was treated using 4 Rapid Arc VMAT Beams to a prescription dose of 5 Gy to be repeated until prescription dose of 25 Gy. ExacTrac registration was performed for each couch angle. The 100% isodose line was prescribed. 6 MV X-rays were delivered in the flattening filter free beam mode.  Narrative: The patient tolerated radiation treatment relatively well with no acute complications with treatment.     Plan: The patient has completed radiation treatment. The patient will return to radiation oncology clinic for routine followup in one month. I advised him to call or return sooner if he has any questions or concerns related to his recovery or treatment. ________________________________  Sheral Apley. Tammi Klippel, M.D.  This document serves as a record of services personally performed by Tyler Pita, MD. It was created on his behalf by Arlyce Harman, a trained medical scribe. The creation of this record is based on the scribe's personal observations and the provider's statements to them. This document has been checked and approved by the attending provider.

## 2016-01-02 ENCOUNTER — Ambulatory Visit: Payer: Self-pay | Admitting: Radiation Oncology

## 2016-01-03 ENCOUNTER — Encounter: Payer: Self-pay | Admitting: Radiation Oncology

## 2016-01-03 ENCOUNTER — Ambulatory Visit
Admission: RE | Admit: 2016-01-03 | Discharge: 2016-01-03 | Disposition: A | Discharge status: Home / Self Care | Payer: BLUE CROSS/BLUE SHIELD | Source: Ambulatory Visit | Attending: Radiation Oncology | Admitting: Radiation Oncology

## 2016-01-03 VITALS — BP 138/83 | HR 83 | Temp 98.5°F | Ht 67.0 in | Wt 160.1 lb

## 2016-01-03 DIAGNOSIS — C641 Malignant neoplasm of right kidney, except renal pelvis: Secondary | ICD-10-CM

## 2016-01-03 DIAGNOSIS — C7951 Secondary malignant neoplasm of bone: Secondary | ICD-10-CM

## 2016-01-03 NOTE — Progress Notes (Signed)
[name] presents for follow up of radiation completed 11/23/15 to his Right Skull Base. He reports decreased vision. He still has occasional headaches but states they have improved since radiation finished. He has some fatigue at times, but it is getting better since finishing radiation. He states he is eating well, and has gained some weight recently. He has a  Right nephrectomy scheduled for next week, but Dr. Brendia Sacks at [facility].  BP 138/83 mmHg  Pulse 83  Temp(Src) 98.5 F (36.9 C)  Ht 5\' 7"  (1.702 m)  Wt 160 lb 1.6 oz (72.621 kg)  BMI 25.07 kg/m2  SpO2 100%   Wt Readings from Last 3 Encounters:  01/03/16 160 lb 1.6 oz (72.621 kg)  11/04/15 159 lb 3.2 oz (72.213 kg)  11/04/15 158 lb (71.668 kg)

## 2016-01-04 NOTE — Progress Notes (Signed)
Radiation Oncology         [phone]) [phone] ________________________________  Name: Gregory [name]. MRN: [id number]  Date: 01/03/2016  DOB: 01-26-1956  Follow-Up Visit Note  CC: Dr. 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    ICD-9-CM ICD-10-CM   1. Bone metastases (HCC) 198.5 C79.51   2. Renal cell carcinoma, right (HCC) 189.0 C64.1     Interval Since Last Radiation:  6  weeks  11/14/2015-11/23/2015: Right Skull Base target was treated using 4 Rapid Arc VMAT Beams to a prescription dose of 5 Gy to be repeated until prescription dose of 25 Gy. ExacTrac registration was performed for each couch angle. The 100% isodose line was prescribed. 6 MV X-rays were delivered in the flattening filter free beam mode.  Narrative:  The patient returns today for routine follow-up.  He has tolerated radiotherapy well.   On review of systems, he states that his vision though sill somewhat decreased has improved with radiation. He is back to playing pool in tournaments and states he's playing quite well. He has noticed some improvement in fatigue as well as appetite. He denies headache currently, double vision, or nausea. He is planning his right nephrectomy next week at [facility]. He has also decided to stay there with medical oncology and will be seeing Dr. Marcello Moores moving forward.  ALLERGIES:  has No Known Allergies.  Meds: Current Outpatient Prescriptions  Medication Sig Dispense Refill  . Aspirin-Acetaminophen-Caffeine (GOODY HEADACHE PO) Take 1 packet by mouth as needed.    . prochlorperazine (COMPAZINE) 10 MG tablet Take 1 tablet (10 mg total) by mouth every 6 (six) hours as needed for nausea or vomiting. (Patient not taking: Reported on 01/03/2016) 30 tablet 3  . SUNItinib (SUTENT) 50 MG capsule Take 1 capsule daily for 4 weeks in a row followed by a 2 week rest period. Repeat. (Patient not taking: Reported on 01/03/2016) 28 capsule 5   No  current facility-administered medications for this encounter.    Physical Findings:  height is 5\' 7"  (1.702 m) and weight is 160 lb 1.6 oz (72.621 kg). His temperature is 98.5 F (36.9 C). His blood pressure is 138/83 and his pulse is 83. His oxygen saturation is 100%.   In general this is a well appearing caucasian male in no acute distress. He's alert and oriented x4 and appropriate throughout the examination. Cardiopulmonary assessment is negative for acute distress and he exhibits normal effort. His scalp is intact with minimal hair loss at the left parietal region. No skin desquamation or disturbance is noted.   Lab Findings: Lab Results  Component Value Date   WBC 10.4 11/01/2015   HGB 17.9* 11/01/2015   HCT 52.3* 11/01/2015   PLT 227 11/01/2015    Lab Results  Component Value Date   NA 138 10/24/2015   K 4.0 10/24/2015   CO2 24 10/24/2015   GLUCOSE 102* 10/24/2015   BUN 13 10/24/2015   CREATININE 0.93 10/24/2015   BILITOT 0.2* 10/24/2015   ALKPHOS 83 10/24/2015   AST 15 10/24/2015   ALT 16* 10/24/2015   PROT 7.3 10/24/2015   ALBUMIN 4.1 10/24/2015   CALCIUM 8.9 10/24/2015   ANIONGAP 7 10/24/2015    Radiographic Findings: No results found.  Impression/Plan: 1. 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 The patient has done well with radiotherapy, and is ready to move forward with nephrectomy next week with Dr. Brendia Sacks. I will discuss surveillance  with Dr. Tammi Klippel and we will likely move forward with imaging studies to follow the treated area moving forward. I will contact the patient once this has been confirmed.     Carola Rhine, PAC

## 2016-01-06 ENCOUNTER — Telehealth: Payer: Self-pay | Admitting: Radiation Oncology

## 2016-01-06 NOTE — Telephone Encounter (Signed)
I called the patient's daughter and left a message communicating Dr. Johny Shears recommendation for an MRI of the brain/skull base in 3 months time.

## 2016-02-06 ENCOUNTER — Other Ambulatory Visit: Payer: Self-pay | Admitting: Radiation Therapy

## 2016-02-06 DIAGNOSIS — C7951 Secondary malignant neoplasm of bone: Secondary | ICD-10-CM

## 2016-02-13 ENCOUNTER — Other Ambulatory Visit (HOSPITAL_COMMUNITY): Payer: Self-pay | Admitting: Hematology and Oncology

## 2016-02-13 DIAGNOSIS — C641 Malignant neoplasm of right kidney, except renal pelvis: Secondary | ICD-10-CM

## 2016-02-17 ENCOUNTER — Other Ambulatory Visit (HOSPITAL_COMMUNITY): Payer: Self-pay | Admitting: Hematology and Oncology

## 2016-02-17 ENCOUNTER — Other Ambulatory Visit: Payer: BLUE CROSS/BLUE SHIELD

## 2016-02-17 DIAGNOSIS — C641 Malignant neoplasm of right kidney, except renal pelvis: Secondary | ICD-10-CM

## 2016-02-20 ENCOUNTER — Ambulatory Visit: Payer: Self-pay | Admitting: Radiation Oncology

## 2016-02-20 ENCOUNTER — Ambulatory Visit (HOSPITAL_COMMUNITY)
Admission: RE | Admit: 2016-02-20 | Discharge: 2016-02-20 | Disposition: A | Discharge status: Home / Self Care | Payer: BLUE CROSS/BLUE SHIELD | Source: Ambulatory Visit | Attending: Hematology and Oncology | Admitting: Hematology and Oncology

## 2016-02-20 DIAGNOSIS — I700 Atherosclerosis of aorta: Secondary | ICD-10-CM | POA: Diagnosis not present

## 2016-02-20 DIAGNOSIS — N200 Calculus of kidney: Secondary | ICD-10-CM | POA: Insufficient documentation

## 2016-02-20 DIAGNOSIS — M899 Disorder of bone, unspecified: Secondary | ICD-10-CM | POA: Diagnosis not present

## 2016-02-20 DIAGNOSIS — R911 Solitary pulmonary nodule: Secondary | ICD-10-CM | POA: Insufficient documentation

## 2016-02-20 DIAGNOSIS — C641 Malignant neoplasm of right kidney, except renal pelvis: Secondary | ICD-10-CM | POA: Diagnosis present

## 2016-02-20 DIAGNOSIS — Z905 Acquired absence of kidney: Secondary | ICD-10-CM | POA: Insufficient documentation

## 2016-02-20 LAB — POCT I-STAT CREATININE: CREATININE: 1.3 mg/dL — AB (ref 0.61–1.24)

## 2016-03-09 ENCOUNTER — Ambulatory Visit
Admission: RE | Admit: 2016-03-09 | Discharge: 2016-03-09 | Disposition: A | Discharge status: Home / Self Care | Payer: BLUE CROSS/BLUE SHIELD | Source: Ambulatory Visit | Attending: Radiation Oncology | Admitting: Radiation Oncology

## 2016-03-09 DIAGNOSIS — C7951 Secondary malignant neoplasm of bone: Secondary | ICD-10-CM

## 2016-03-09 MED ORDER — GADOBENATE DIMEGLUMINE 529 MG/ML IV SOLN
14.0000 mL | Freq: Once | INTRAVENOUS | Status: AC | PRN
  Administered 2016-03-09: 14 mL via INTRAVENOUS

## 2016-03-12 ENCOUNTER — Encounter: Payer: Self-pay | Admitting: Radiation Oncology

## 2016-03-12 ENCOUNTER — Ambulatory Visit
Admission: RE | Admit: 2016-03-12 | Discharge: 2016-03-12 | Disposition: A | Discharge status: Home / Self Care | Payer: BLUE CROSS/BLUE SHIELD | Source: Ambulatory Visit | Attending: Radiation Oncology | Admitting: Radiation Oncology

## 2016-03-12 VITALS — BP 119/78 | HR 82 | Resp 16 | Wt 151.0 lb

## 2016-03-12 DIAGNOSIS — Z905 Acquired absence of kidney: Secondary | ICD-10-CM | POA: Insufficient documentation

## 2016-03-12 DIAGNOSIS — C7951 Secondary malignant neoplasm of bone: Secondary | ICD-10-CM | POA: Insufficient documentation

## 2016-03-12 DIAGNOSIS — Z79899 Other long term (current) drug therapy: Secondary | ICD-10-CM | POA: Diagnosis not present

## 2016-03-12 DIAGNOSIS — Z8249 Family history of ischemic heart disease and other diseases of the circulatory system: Secondary | ICD-10-CM | POA: Diagnosis not present

## 2016-03-12 DIAGNOSIS — C649 Malignant neoplasm of unspecified kidney, except renal pelvis: Secondary | ICD-10-CM | POA: Insufficient documentation

## 2016-03-12 DIAGNOSIS — F17210 Nicotine dependence, cigarettes, uncomplicated: Secondary | ICD-10-CM | POA: Diagnosis not present

## 2016-03-12 DIAGNOSIS — Z7982 Long term (current) use of aspirin: Secondary | ICD-10-CM | POA: Insufficient documentation

## 2016-03-12 DIAGNOSIS — M899 Disorder of bone, unspecified: Secondary | ICD-10-CM | POA: Diagnosis not present

## 2016-03-12 NOTE — Progress Notes (Addendum)
Vitals stable. Nine pound weight loss noted since surgery due to lack of appetite following surgery. Reports he hasn't had a headache in two months. Reports he hasn't even taken BC powder in two months. Right eye drooping less pronounce. Reports diplopia has resolved. Denies nausea, vomiting or dizziness. States, "I feel great." Patient denies taking any medication at this time.     BP 119/78 mmHg  Pulse 82  Resp 16  Wt 151 lb (68.493 kg)  SpO2 100% Wt Readings from Last 3 Encounters:  03/12/16 151 lb (68.493 kg)  01/03/16 160 lb 1.6 oz (72.621 kg)  11/04/15 159 lb 3.2 oz (72.213 kg)

## 2016-03-12 NOTE — Progress Notes (Signed)
Radiation Oncology         [phone]) [phone] ________________________________  Name: Gregory [name]. 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    ICD-9-CM ICD-10-CM   1. Bone metastases (HCC) 198.5 C79.51   2. Skull lesion 733.90 M89.9     Interval Since Last Radiation:  3.5 months.   11/14/2015-11/23/2015 to the right skull base target treated using 4 Rapid Arc VMAT Beams to a prescription dose of 5 Gy to be repeated until prescription dose of 25 Gy.  Narrative:  The patient returns today for routine follow-up. He has undergone radical robotic right nephrectomy since his last visit. He continues on Sutent with Dr. Marcello Moores at [facility]. His MRI of the brain reveals stability of the skull based lesion without evidence of new lesions.   On review of systems, the patient reports that he is doing well overall. His vision has improved and he reports that he does not see duplicate objects of his left visual field any longer. He has lost 9 pounds following surgery and attributes this to poor appetite while healing. He denies any chest pain, shortness of breath, cough, fevers, chills, night sweats. He denies any bowel or bladder disturbances, and denies abdominal pain, nausea or vomiting. He denies any new musculoskeletal or joint aches or pains. A complete review of systems is obtained and is otherwise negative.  Past Medical History:  Past Medical History  Diagnosis Date  . Skull lesion 10/20/2015    metastatic renal cell carcinoma  . Renal cell carcinoma ([place]) 10/23/2015    Past Surgical History: Past Surgical History  Procedure Laterality Date  . Robotic radical right nephrectomy      Social History:  Social History   Social History  . Marital Status: Single    Spouse Name: N/A  . Number of Children: N/A  . Years of  Education: N/A   Occupational History  . Electrician Other    [place] History Main Topics  . Smoking status: Current Every Day Smoker -- 1.00 packs/day for 45 years    Types: Cigarettes  . Smokeless tobacco: Never Used  . Alcohol Use: 12.0 oz/week    20 Shots of liquor per week     Comment: 1-5 shot of liquor per night  . Drug Use: 1.00 per week    Special: Marijuana     Comment: occasionally  . Sexual Activity: Yes   Other Topics Concern  . Not on file   Social History Narrative    Family History: Family History  Problem Relation Age of Onset  . CAD Mother   . CAD Father   . Cancer Paternal Uncle     stomach cancer  . Cancer Paternal Uncle     unknown    ALLERGIES:  has No Known Allergies.  Meds: Current Outpatient Prescriptions  Medication Sig Dispense Refill  . Aspirin-Acetaminophen-Caffeine (GOODY HEADACHE PO) Take 1 packet by mouth as needed. Reported on 03/12/2016    . prochlorperazine (COMPAZINE) 10 MG tablet Take 1 tablet (10 mg total) by mouth every 6 (six) hours as needed for nausea or vomiting. (Patient not taking: Reported on 01/03/2016) 30 tablet 3  . SUNItinib (SUTENT) 50 MG capsule Take 1 capsule daily for 4 weeks in a row followed by a 2 week rest period. Repeat. (  Patient not taking: Reported on 01/03/2016) 28 capsule 5   No current facility-administered medications for this encounter.    Physical Findings:  weight is 151 lb (68.493 kg). His blood pressure is 119/78 and his pulse is 82. His respiration is 16 and oxygen saturation is 100%.   In general this is a well appearing Caucasian male in no acute distress. His left eye still has changes of ptosis, but this is less noticeable. He is normocephalic, atraumatic. EOMS are intact. Tongue is midline as is the uvula. He's alert and oriented x4 and appropriate throughout the examination. Cardiopulmonary assessment is negative for acute distress and he exhibits normal effort.    Lab  Findings: Lab Results  Component Value Date   WBC 10.4 11/01/2015   HGB 17.9* 11/01/2015   HCT 52.3* 11/01/2015   PLT 227 11/01/2015    Lab Results  Component Value Date   NA 138 10/24/2015   K 4.0 10/24/2015   CO2 24 10/24/2015   GLUCOSE 102* 10/24/2015   BUN 13 10/24/2015   CREATININE 1.30* 02/20/2016   BILITOT 0.2* 10/24/2015   ALKPHOS 83 10/24/2015   AST 15 10/24/2015   ALT 16* 10/24/2015   PROT 7.3 10/24/2015   ALBUMIN 4.1 10/24/2015   CALCIUM 8.9 10/24/2015   ANIONGAP 7 10/24/2015    Radiographic Findings: Ct Chest W Contrast  02/20/2016  CLINICAL DATA:  Right renal cell carcinoma EXAM: CT CHEST, ABDOMEN, AND PELVIS WITH CONTRAST TECHNIQUE: Multidetector CT imaging of the chest, abdomen and pelvis was performed following the standard protocol during bolus administration of intravenous contrast. COMPARISON:  10/21/2015 FINDINGS: CT CHEST FINDINGS Mediastinum/Lymph Nodes: There is no axillary lymphadenopathy. No mediastinal lymphadenopathy. There is no hilar lymphadenopathy. The heart size is normal. Coronary artery calcification is noted. No pericardial effusion. The esophagus has normal imaging features. Lungs/Pleura: Emphysema noted bilaterally. 6 mm nodule along the right major fissure (image 59 series 6) is unchanged. No new pulmonary nodule or mass. No focal airspace consolidation. No pulmonary edema or pleural effusion. Musculoskeletal: Bone windows reveal no worrisome lytic or sclerotic osseous lesions. CT ABDOMEN PELVIS FINDINGS Hepatobiliary: No focal abnormality within the liver parenchyma. There is no evidence for gallstones, gallbladder wall thickening, or pericholecystic fluid. No intrahepatic or extrahepatic biliary dilation. Pancreas: No focal mass lesion. No dilatation of the main duct. No intraparenchymal cyst. No peripancreatic edema. Spleen: No splenomegaly. No focal mass lesion. Adrenals/Urinary Tract: No adrenal nodule or mass. Right kidney has been resected in  the interval. The left kidney is stable. 8 mm probable interpolar cyst again noted, unchanged. 11 mm probable cyst in the extreme lower pole is stable. 3 mm nonobstructing lower pole calculus is again noted. Left ureter is normal. The urinary bladder appears normal for the degree of distention. Stomach/Bowel: Stomach is nondistended. No gastric wall thickening. No evidence of outlet obstruction. Duodenum is normally positioned as is the ligament of Treitz. No small bowel wall thickening. No small bowel dilatation. The terminal ileum is normal. The appendix is normal. No gross colonic mass. No colonic wall thickening. No substantial diverticular change. Vascular/Lymphatic: There is abdominal aortic atherosclerosis without aneurysm. There is no gastrohepatic or hepatoduodenal ligament lymphadenopathy. No intraperitoneal or retroperitoneal lymphadenopathy. No pelvic sidewall lymphadenopathy. Reproductive: The prostate gland and seminal vesicles have normal imaging features. Other: No intraperitoneal free fluid. Musculoskeletal: 15 mm sclerotic lesion in the right femoral neck is stable. IMPRESSION: 1. Interval right nephrectomy. No evidence for abnormal soft tissue in the surgical bed. No lymphadenopathy in  the abdomen or pelvis to suggest metastatic involvement. 2. No new or enlarging pulmonary nodules. 6 mm perifissural nodule in the right lung is probably a subpleural lymph node. Attention on follow-up suggested. 3. Stable appearance of 15 mm sclerotic lesion in the right femoral neck, indeterminate. As metastatic disease to the bone is not excluded, close continued attention recommended. 4. 3 mm nonobstructing left renal stone with stable appearance of probable cysts in the left kidney. 5. Abdominal aortic atherosclerosis. *Of blood in hand and air situs the mid-upper osteophytic is a healed right trachea noted in the upper Electronically Signed   By: Misty Stanley M.D.   On: 02/20/2016 15:56   Mr Jeri Cos [id number]  Contrast  03/09/2016  CLINICAL DATA:  Metastatic renal cell carcinoma. Metastatic disease to right skullbase treated with radiation therapy. Creatinine was obtained on site at [facility] at [address] Results: Creatinine 1.2 mg/dL. EXAM: MRI HEAD WITHOUT AND WITH CONTRAST TECHNIQUE: Multiplanar, multiecho pulse sequences of the brain and surrounding structures were obtained without and with intravenous contrast. CONTRAST:  76mL MULTIHANCE GADOBENATE DIMEGLUMINE 529 MG/ML IV SOLN COMPARISON:  MRI and CT 10/21/2015 FINDINGS: Metastatic disease to the right skullbase again noted post treatment. Tumor extent is similar to the prior study. The tumor continues to show enhancement although this is slightly more heterogeneous post treatment. There is probable involvement of the hypoglossal canal on the right. There is tumor surrounding the right internal carotid artery through the skullbase similar to the prior study. The internal carotid artery continues to be patent. Enhancing tumor bulges into the right sphenoid sinus as noted previously. Tumor involves the petrous apex similar to the prior study. There is bulging of the dura posterior to the petrous ridge similar to the prior study without intracranial extension of tumor. Transverse sinus and sigmoid sinus remain patent. Ventricle size is normal. No shift of the midline structures. No cerebral edema. Chronic micro hemorrhage left superior cerebellum. Negative for acute infarct. No new areas of metastatic disease identified in the brain or skull. Mild mucosal edema paranasal sinuses. Interval development of mastoid effusion on the right following treatment. IMPRESSION: Metastatic disease to the right posterior skull base involving the pertous bone extending into the clivus and surrounding the right internal carotid artery. Tumor involves the right hypoglossal canal. Tumor continues to bulge into the sphenoid sinus. Overall the enhancing tumor volume is  similar to the prior study but shows more heterogeneous enhancement due to interval treatment. Internal carotid artery patent. Transverse and sigmoid sinus patent on the right. No new metastatic deposits identified. Electronically Signed   By: Franchot Gallo M.D.   On: 03/09/2016 14:02   Ct Abdomen Pelvis W Contrast  02/20/2016  CLINICAL DATA:  Right renal cell carcinoma EXAM: CT CHEST, ABDOMEN, AND PELVIS WITH CONTRAST TECHNIQUE: Multidetector CT imaging of the chest, abdomen and pelvis was performed following the standard protocol during bolus administration of intravenous contrast. COMPARISON:  10/21/2015 FINDINGS: CT CHEST FINDINGS Mediastinum/Lymph Nodes: There is no axillary lymphadenopathy. No mediastinal lymphadenopathy. There is no hilar lymphadenopathy. The heart size is normal. Coronary artery calcification is noted. No pericardial effusion. The esophagus has normal imaging features. Lungs/Pleura: Emphysema noted bilaterally. 6 mm nodule along the right major fissure (image 59 series 6) is unchanged. No new pulmonary nodule or mass. No focal airspace consolidation. No pulmonary edema or pleural effusion. Musculoskeletal: Bone windows reveal no worrisome lytic or sclerotic osseous lesions. CT ABDOMEN PELVIS FINDINGS Hepatobiliary: No focal abnormality within the  liver parenchyma. There is no evidence for gallstones, gallbladder wall thickening, or pericholecystic fluid. No intrahepatic or extrahepatic biliary dilation. Pancreas: No focal mass lesion. No dilatation of the main duct. No intraparenchymal cyst. No peripancreatic edema. Spleen: No splenomegaly. No focal mass lesion. Adrenals/Urinary Tract: No adrenal nodule or mass. Right kidney has been resected in the interval. The left kidney is stable. 8 mm probable interpolar cyst again noted, unchanged. 11 mm probable cyst in the extreme lower pole is stable. 3 mm nonobstructing lower pole calculus is again noted. Left ureter is normal. The urinary  bladder appears normal for the degree of distention. Stomach/Bowel: Stomach is nondistended. No gastric wall thickening. No evidence of outlet obstruction. Duodenum is normally positioned as is the ligament of Treitz. No small bowel wall thickening. No small bowel dilatation. The terminal ileum is normal. The appendix is normal. No gross colonic mass. No colonic wall thickening. No substantial diverticular change. Vascular/Lymphatic: There is abdominal aortic atherosclerosis without aneurysm. There is no gastrohepatic or hepatoduodenal ligament lymphadenopathy. No intraperitoneal or retroperitoneal lymphadenopathy. No pelvic sidewall lymphadenopathy. Reproductive: The prostate gland and seminal vesicles have normal imaging features. Other: No intraperitoneal free fluid. Musculoskeletal: 15 mm sclerotic lesion in the right femoral neck is stable. IMPRESSION: 1. Interval right nephrectomy. No evidence for abnormal soft tissue in the surgical bed. No lymphadenopathy in the abdomen or pelvis to suggest metastatic involvement. 2. No new or enlarging pulmonary nodules. 6 mm perifissural nodule in the right lung is probably a subpleural lymph node. Attention on follow-up suggested. 3. Stable appearance of 15 mm sclerotic lesion in the right femoral neck, indeterminate. As metastatic disease to the bone is not excluded, close continued attention recommended. 4. 3 mm nonobstructing left renal stone with stable appearance of probable cysts in the left kidney. 5. Abdominal aortic atherosclerosis. *Of blood in hand and air situs the mid-upper osteophytic is a healed right trachea noted in the upper Electronically Signed   By: Misty Stanley M.D.   On: 02/20/2016 15:56    Impression/Plan: 1. Stage Metastatic Renal Cell Carcinoma to the skull base. The patient appears to be doing well clinically. His vision has improved, and radiographically, he appears to be stable. We will follow up with the patient in 3 months, and will  plan a T3 MRI prior to that visit which will be reviewed at brain conference prior to his visit. He will continue under the care of Dr. Marcello Moores at [facility] with Sutent for his systemic therapy.  2. Right sided hearing loss. The patient is offered a referral to ENT for audiology consult. He declines and feels that this is not enough of an issue for a referral. I have encouraged him to call if he changes his mind prior to his next visit.    Carola Rhine, PAC

## 2016-03-13 ENCOUNTER — Encounter: Payer: Self-pay | Admitting: Radiation Oncology

## 2016-05-21 ENCOUNTER — Other Ambulatory Visit: Payer: Self-pay | Admitting: *Deleted

## 2016-05-21 DIAGNOSIS — C7931 Secondary malignant neoplasm of brain: Secondary | ICD-10-CM

## 2016-05-21 DIAGNOSIS — C7949 Secondary malignant neoplasm of other parts of nervous system: Principal | ICD-10-CM

## 2016-06-14 ENCOUNTER — Other Ambulatory Visit: Payer: BLUE CROSS/BLUE SHIELD

## 2016-06-14 ENCOUNTER — Ambulatory Visit
Admission: RE | Admit: 2016-06-14 | Discharge: 2016-06-14 | Disposition: A | Discharge status: Home / Self Care | Payer: BLUE CROSS/BLUE SHIELD | Source: Ambulatory Visit | Attending: Radiation Oncology | Admitting: Radiation Oncology

## 2016-06-14 DIAGNOSIS — C7949 Secondary malignant neoplasm of other parts of nervous system: Principal | ICD-10-CM

## 2016-06-14 DIAGNOSIS — C7931 Secondary malignant neoplasm of brain: Secondary | ICD-10-CM

## 2016-06-14 MED ORDER — GADOBENATE DIMEGLUMINE 529 MG/ML IV SOLN
14.0000 mL | Freq: Once | INTRAVENOUS | Status: AC | PRN
  Administered 2016-06-14: 14 mL via INTRAVENOUS

## 2016-06-18 ENCOUNTER — Encounter: Payer: Self-pay | Admitting: Radiation Oncology

## 2016-06-18 ENCOUNTER — Ambulatory Visit
Admission: RE | Admit: 2016-06-18 | Discharge: 2016-06-18 | Disposition: A | Discharge status: Home / Self Care | Payer: BLUE CROSS/BLUE SHIELD | Source: Ambulatory Visit | Attending: Radiation Oncology | Admitting: Radiation Oncology

## 2016-06-18 VITALS — BP 119/93 | HR 72 | Temp 98.1°F | Resp 18 | Ht 67.0 in | Wt 150.0 lb

## 2016-06-18 DIAGNOSIS — C7951 Secondary malignant neoplasm of bone: Secondary | ICD-10-CM | POA: Diagnosis present

## 2016-06-18 DIAGNOSIS — F17200 Nicotine dependence, unspecified, uncomplicated: Secondary | ICD-10-CM | POA: Insufficient documentation

## 2016-06-18 DIAGNOSIS — B078 Other viral warts: Secondary | ICD-10-CM | POA: Diagnosis not present

## 2016-06-18 DIAGNOSIS — Z905 Acquired absence of kidney: Secondary | ICD-10-CM | POA: Insufficient documentation

## 2016-06-18 DIAGNOSIS — Z79899 Other long term (current) drug therapy: Secondary | ICD-10-CM | POA: Insufficient documentation

## 2016-06-18 DIAGNOSIS — Z7982 Long term (current) use of aspirin: Secondary | ICD-10-CM | POA: Diagnosis not present

## 2016-06-18 DIAGNOSIS — F17210 Nicotine dependence, cigarettes, uncomplicated: Secondary | ICD-10-CM | POA: Diagnosis not present

## 2016-06-18 DIAGNOSIS — Z923 Personal history of irradiation: Secondary | ICD-10-CM | POA: Diagnosis not present

## 2016-06-18 DIAGNOSIS — C641 Malignant neoplasm of right kidney, except renal pelvis: Secondary | ICD-10-CM | POA: Diagnosis not present

## 2016-06-18 DIAGNOSIS — H7091 Unspecified mastoiditis, right ear: Secondary | ICD-10-CM | POA: Insufficient documentation

## 2016-06-18 DIAGNOSIS — H908 Mixed conductive and sensorineural hearing loss, unspecified: Secondary | ICD-10-CM | POA: Diagnosis not present

## 2016-06-18 DIAGNOSIS — Z8249 Family history of ischemic heart disease and other diseases of the circulatory system: Secondary | ICD-10-CM | POA: Diagnosis not present

## 2016-06-18 DIAGNOSIS — M899 Disorder of bone, unspecified: Secondary | ICD-10-CM

## 2016-06-18 DIAGNOSIS — H9071 Mixed conductive and sensorineural hearing loss, unilateral, right ear, with unrestricted hearing on the contralateral side: Secondary | ICD-10-CM

## 2016-06-18 NOTE — Addendum Note (Signed)
Encounter addended by: Malena Edman, RN on: 06/18/2016 11:31 AM<BR>    Actions taken: Charge Capture section accepted

## 2016-06-18 NOTE — Progress Notes (Signed)
Gregory [name] is here for a follow up visit for bone metastases and skull lesion to get results of brain imaging.  Reports right ear hearing loss since radiation was completed, feels like the right ear is stopped up all the time sometimes when he swallow it like he hears a noise.  Denies pan.  Appetite is good. BP (!) 119/93 (BP Location: Left Arm, Patient Position: Standing, Cuff Size: Normal)   Pulse 72   Temp 98.1 F (36.7 C) (Oral)   Resp 18   Ht 5\' 7"  (1.702 m)   Wt 150 lb (68 kg)   SpO2 100%   BMI 23.49 kg/m

## 2016-06-18 NOTE — Progress Notes (Addendum)
Radiation Oncology         [phone]) [phone] ________________________________  Name: Gregory [name]. 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    ICD-9-CM ICD-10-CM   1. Mixed conductive and sensorineural hearing loss of right ear, unspecified hearing status on contralateral side 389.21 H90.71 Ambulatory referral to ENT  2. Palmar wart 078.19 B07.8   3. Mastoiditis of right side 383.9 H70.91   4. Skull lesion 733.90 M89.9 Ambulatory referral to ENT  5. Renal cell carcinoma of right kidney (HCC) 189.0 C64.1 Ambulatory referral to ENT  6. Tobacco use disorder 305.1 F17.200     Interval Since Last Radiation:  7 months.   11/14/2015-11/23/2015 to the right skull base target treated using 4 Rapid Arc VMAT Beams to a prescription dose of 5 Gy to be repeated until prescription dose of 25 Gy.  Narrative:  This is a pleasant 60 yo gentle man with a history if metastatic renal cell carcinoma who had radiotherapy about 7 months ago. He had a MRI on 06/14/16 of the brain which revealed unchanged appearance in his previous treated tumor. It still is noted to encase the internal carotid artery, and he does have right sided mastoiditis by imaging. He continues to follow with Dr. Marcello Moores in medical oncology, but is not taking Sutent.  On review of systems, the patient reports that he is doing well overall. He denies any chest pain, shortness of breath, cough, fevers, chills, night sweats, unintended weight changes. He denies any bowel or bladder disturbances, and denies abdominal pain, nausea or vomiting. He denies any new musculoskeletal or joint aches or pains. Patient reports right ear hearing loss since radiation was completed, and feels like the right ear is constantly stopped up. At times when he swallows he hears a noise. At night when the  patient goes to bed he reports hearing a pounding, "drum-like" sound in his right ear upon laying down. A complete review of systems is obtained and is otherwise negative.  Past Medical History:  Past Medical History:  Diagnosis Date  . Renal cell carcinoma ([place]) 10/23/2015  . Skull lesion 10/20/2015   metastatic renal cell carcinoma    Past Surgical History: Past Surgical History:  Procedure Laterality Date  . robotic radical right nephrectomy      Social History:  Social History   Social History  . Marital status: Single    Spouse name: N/A  . Number of children: N/A  . Years of education: N/A   Occupational History  . Electrician Other    [place] History Main Topics  . Smoking status: Current Every Day Smoker    Packs/day: 1.00    Years: 45.00    Types: Cigarettes  . Smokeless tobacco: Never Used  . Alcohol use 12.0 oz/week    20 Shots of liquor per week     Comment: 1-5 shot of liquor per night  . Drug use:     Frequency: 1.0 time per week    Types: Marijuana     Comment: occasionally  . Sexual activity: Yes   Other Topics Concern  . Not on file   Social History Narrative  . No narrative on file    Family History: Family History  Problem Relation Age of Onset  . CAD Mother   .  CAD Father   . Cancer Paternal Uncle     stomach cancer  . Cancer Paternal Uncle     unknown    ALLERGIES:  has No Known Allergies.  Meds: Current Outpatient Prescriptions  Medication Sig Dispense Refill  . Aspirin-Acetaminophen-Caffeine (GOODY HEADACHE PO) Take 1 packet by mouth as needed. Reported on 03/12/2016    . prochlorperazine (COMPAZINE) 10 MG tablet Take 1 tablet (10 mg total) by mouth every 6 (six) hours as needed for nausea or vomiting. (Patient not taking: Reported on 06/18/2016) 30 tablet 3  . SUNItinib (SUTENT) 50 MG capsule Take 1 capsule daily for 4 weeks in a row followed by a 2 week rest period. Repeat. (Patient not taking: Reported on  06/18/2016) 28 capsule 5   No current facility-administered medications for this encounter.     Physical Findings:  height is 5\' 7"  (1.702 m) and weight is 150 lb (68 kg). His oral temperature is 98.1 F (36.7 C). His blood pressure is 119/93 (abnormal) and his pulse is 72. His respiration is 18 and oxygen saturation is 100%.   In general this is a well appearing Caucasian male in no acute distress. He is normocephalic, atraumatic. EOMS are intact. He's alert and oriented x4 and appropriate throughout the examination. Cardiopulmonary assessment is negative for acute distress and he exhibits normal effort. Tuning fork exam reveals that air conduction and bone conduction are eqaul bilaterally. Skin is assessed and he does have some ulceration of the right dorsal forearm as well as the right middle digit between the MIP and DIP joint.    Lab Findings: Lab Results  Component Value Date   WBC 10.4 11/01/2015   HGB 17.9 (H) 11/01/2015   HCT 52.3 (H) 11/01/2015   PLT 227 11/01/2015    Lab Results  Component Value Date   NA 138 10/24/2015   K 4.0 10/24/2015   CO2 24 10/24/2015   GLUCOSE 102 (H) 10/24/2015   BUN 13 10/24/2015   CREATININE 1.30 (H) 02/20/2016   BILITOT 0.2 (L) 10/24/2015   ALKPHOS 83 10/24/2015   AST 15 10/24/2015   ALT 16 (L) 10/24/2015   PROT 7.3 10/24/2015   ALBUMIN 4.1 10/24/2015   CALCIUM 8.9 10/24/2015   ANIONGAP 7 10/24/2015    Radiographic Findings: Mr Jeri Cos [id number] Contrast  Result Date: 06/15/2016 CLINICAL DATA:  60 y/o M; metastatic renal cell carcinoma. SRS protocol. EXAM: MRI HEAD WITHOUT AND WITH CONTRAST TECHNIQUE: Multiplanar, multiecho pulse sequences of the brain and surrounding structures were obtained without and with intravenous contrast. CONTRAST:  66mL MULTIHANCE GADOBENATE DIMEGLUMINE 529 MG/ML IV SOLN COMPARISON:  10/21/2015 MR head and 03/09/2016 MR head. FINDINGS: Brain: No acute infarction, hemorrhage, hydrocephalus, extra-axial collection or  mass lesion. No abnormal enhancement of the brain parenchyma. Vascular: Normal flow voids. Skull and upper cervical spine: Right-sided occipital bone skull base mass extending from the right occipital condyle inferiorly, surrounding the right hypoglossal canal and abutting the sigmoid sinus, with superior extension into petrous apex and right hemi-clivus is stable in comparison with the prior MRI of the brain. The mass surrounds the distal horizontal petrous and proximal cavernous segments of right internal carotid artery which is patent with a normal flow void. The mass extends superiorly to the level of posterior clinoid process and anteriorly bulging into the sphenoid sinus. There is no appreciable invasion into the soft tissues of the neck or intradural structures. Sinuses/Orbits: Opacification of right mastoid air cells likely due to the station obstruction from  skullbase mass. No significant paranasal sinus disease. No abnormal signal of left mastoid air cells. Other: None. IMPRESSION: 1. Stable right-sided skullbase mass as described. No new metastatic disease is identified. 2. Right mastoid opacification likely secondary to eustachian obstruction from the skullbase mass. Electronically Signed   By: Kristine Garbe M.D.   On: 06/15/2016 00:34    Impression/Plan: 1. Stage Metastatic Renal Cell Carcinoma to the skull base. The patient appears to be doing well clinically. His vision has improved, and radiographically, he appears to be stable. We will follow up with the patient in 3 months, and will plan a T3 MRI prior to that visit which will be reviewed at brain conference prior to his visit. He will continue under the care of Dr. Marcello Moores at [facility].  2. Right sided hearing loss. The patient is offered a referral to ENT for audiology consult. He is in agreement and will be referred to [facility] in [place]. We discussed option for tympanostomy tubes in the occulusion of the tube seen on  the MRI scan as well as the mastoiditis. 3. Tobacco Usage. I counselled him on tobacco cessation over the next several months and he will consider slowly tapering one cigarette/day from his pack per week. 4. Dermatologic Issues. The patient was encouraged to follow up with dermatology regarding ulceration of the right dorsal forearm and right middle digit.  In a visit lasting 35 minutes, greater than 50% of our time was spent face to face discussing tobacco cessation as well as referral to ENT.    Carola Rhine, PAC    This document serves as a record of services personally performed by Tyler Pita, MD and Shona Simpson, PA. It was created on his behalf by Maryla Morrow, a trained medical scribe. The creation of this record is based on the scribe's personal observations and the provider's statements to them. This document has been checked and approved by the attending provider.

## 2016-06-19 ENCOUNTER — Telehealth: Payer: Self-pay | Admitting: *Deleted

## 2016-06-19 NOTE — Telephone Encounter (Signed)
CALLED PATIENT TO INFORM OF APPT. WITH DR. Benjamine Mola ON 07-05-16 - ARRIVAL TIME - 2:20 PM @[address], [address], [place], [place], NO ANSWER WILL CALL LATER

## 2016-06-19 NOTE — Addendum Note (Signed)
Encounter addended by: Hayden Pedro, PA-C on: 06/19/2016 10:38 PM<BR>    Actions taken: Sign clinical note

## 2016-06-22 ENCOUNTER — Other Ambulatory Visit: Payer: Self-pay | Admitting: Radiation Therapy

## 2016-06-22 DIAGNOSIS — C7931 Secondary malignant neoplasm of brain: Secondary | ICD-10-CM

## 2016-06-22 DIAGNOSIS — C7949 Secondary malignant neoplasm of other parts of nervous system: Principal | ICD-10-CM

## 2016-07-05 ENCOUNTER — Ambulatory Visit (INDEPENDENT_AMBULATORY_CARE_PROVIDER_SITE_OTHER): Payer: BLUE CROSS/BLUE SHIELD | Admitting: Otolaryngology

## 2016-09-19 ENCOUNTER — Other Ambulatory Visit: Payer: BLUE CROSS/BLUE SHIELD

## 2016-09-23 ENCOUNTER — Other Ambulatory Visit: Payer: Medicaid Other

## 2016-09-24 ENCOUNTER — Ambulatory Visit
Admission: RE | Admit: 2016-09-24 | Discharge: 2016-09-24 | Disposition: A | Discharge status: Home / Self Care | Payer: Medicaid Other | Source: Ambulatory Visit | Attending: Radiation Oncology | Admitting: Radiation Oncology

## 2016-09-28 NOTE — Progress Notes (Signed)
Mr. Gregory, [name]. 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    Headache-frequency,location: Infrequent headaches Fatigue: Mild fatigue stated he stays up late at night. Pain:None Nausea/vomiting Ataxia(unsteady gait): None Vision (Blurred/double vision,blind spots, and peripheral vsion changes):  None Appetite is good.  Referred to [facility] in [place] for right ear did not keep the appointment has not rescheduled the appointment. Dermatologic Issues: The patient was encouraged to follow up with dermatology regarding ulceration of the right dorsal forearm and right middle digit. Did not do any follow up used compound w and it is going away. Wt Readings from Last 3 Encounters:  10/03/16 152 lb 9.6 oz (69.2 kg)  06/18/16 150 lb (68 kg)  03/12/16 151 lb (68.5 kg)  BP (!) 153/90   Pulse 96   Temp 99 F (37.2 C) (Oral)   Resp 18   Ht 5\' 7"  (1.702 m)   Wt 152 lb 9.6 oz (69.2 kg)   SpO2 100%   BMI 23.90 kg/m

## 2016-09-28 NOTE — Progress Notes (Deleted)
[name], Gregory [name] 61 y.o. 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

## 2016-09-29 ENCOUNTER — Ambulatory Visit
Admission: RE | Admit: 2016-09-29 | Discharge: 2016-09-29 | Disposition: A | Discharge status: Home / Self Care | Payer: Medicaid Other | Source: Ambulatory Visit | Attending: Radiation Oncology | Admitting: Radiation Oncology

## 2016-09-29 DIAGNOSIS — C7931 Secondary malignant neoplasm of brain: Secondary | ICD-10-CM

## 2016-09-29 DIAGNOSIS — C7949 Secondary malignant neoplasm of other parts of nervous system: Principal | ICD-10-CM

## 2016-09-29 MED ORDER — GADOBENATE DIMEGLUMINE 529 MG/ML IV SOLN
15.0000 mL | Freq: Once | INTRAVENOUS | Status: AC | PRN
  Administered 2016-09-29: 13 mL via INTRAVENOUS

## 2016-10-03 ENCOUNTER — Encounter: Payer: Self-pay | Admitting: Radiation Oncology

## 2016-10-03 ENCOUNTER — Ambulatory Visit
Admission: RE | Admit: 2016-10-03 | Discharge: 2016-10-03 | Disposition: A | Discharge status: Home / Self Care | Payer: Medicaid Other | Source: Ambulatory Visit | Attending: Radiation Oncology | Admitting: Radiation Oncology

## 2016-10-03 VITALS — BP 153/90 | HR 96 | Temp 99.0°F | Resp 18 | Ht 67.0 in | Wt 152.6 lb

## 2016-10-03 DIAGNOSIS — Z85528 Personal history of other malignant neoplasm of kidney: Secondary | ICD-10-CM | POA: Diagnosis not present

## 2016-10-03 DIAGNOSIS — Z905 Acquired absence of kidney: Secondary | ICD-10-CM | POA: Insufficient documentation

## 2016-10-03 DIAGNOSIS — C641 Malignant neoplasm of right kidney, except renal pelvis: Secondary | ICD-10-CM | POA: Diagnosis not present

## 2016-10-03 DIAGNOSIS — Z79899 Other long term (current) drug therapy: Secondary | ICD-10-CM | POA: Insufficient documentation

## 2016-10-03 DIAGNOSIS — Z8249 Family history of ischemic heart disease and other diseases of the circulatory system: Secondary | ICD-10-CM | POA: Insufficient documentation

## 2016-10-03 DIAGNOSIS — H918X1 Other specified hearing loss, right ear: Secondary | ICD-10-CM | POA: Diagnosis not present

## 2016-10-03 DIAGNOSIS — Z800 Family history of malignant neoplasm of digestive organs: Secondary | ICD-10-CM | POA: Diagnosis not present

## 2016-10-03 DIAGNOSIS — Z7982 Long term (current) use of aspirin: Secondary | ICD-10-CM | POA: Diagnosis not present

## 2016-10-03 DIAGNOSIS — F17210 Nicotine dependence, cigarettes, uncomplicated: Secondary | ICD-10-CM | POA: Insufficient documentation

## 2016-10-03 DIAGNOSIS — C7951 Secondary malignant neoplasm of bone: Secondary | ICD-10-CM | POA: Diagnosis present

## 2016-10-04 ENCOUNTER — Telehealth: Payer: Self-pay | Admitting: Radiation Oncology

## 2016-10-04 DIAGNOSIS — C641 Malignant neoplasm of right kidney, except renal pelvis: Secondary | ICD-10-CM

## 2016-10-04 NOTE — Telephone Encounter (Signed)
I was not successful in contacting the patient's daughter, she does not have VM set up.

## 2016-10-04 NOTE — Progress Notes (Signed)
Radiation Oncology         [phone]) [phone] ________________________________  Name: [name]. 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    ICD-9-CM ICD-10-CM   1. Bone metastases (HCC) 198.5 C79.51   2. Renal cell carcinoma of right kidney (HCC) 189.0 C64.1     Interval Since Last Radiation: 10 months.   11/14/2015-11/23/2015 to the right skull base target treated using 4 Rapid Arc VMAT Beams to a prescription dose of 5 Gy to be repeated until prescription dose of 25 Gy.  Narrative:  This is a pleasant 61 y.o. gentleman with a history if metastatic renal cell carcinoma who had radiotherapy about 7 months ago. He had a MRI on 06/14/16 of the brain which revealed unchanged appearance in his previous treated tumor. It still is noted to encase the internal carotid artery, and he does have right sided mastoiditis by imaging. He continues to follow with Dr. Marcello Moores in medical oncology, but is not taking Sutent. He was supposed to follow-up in October 2017, however has not been back to be seen by medical oncology for his systemic imaging.  On review of systems, the patient reports that he is doing well overall. He denies any chest pain, shortness of breath, cough, fevers, chills, night sweats, unintended weight changes.He denies any bowel or bladder disturbances, and denies abdominal pain, nausea or vomiting. He denies any new musculoskeletal or joint aches or pains, new skin lesions or concerns. He reports that he is compound W forward on his hand and I appears to be improving, and he did not keep his appointment for areas and throat assessment, he continues to have episodes of altered auditory acuity. A complete review of systems is obtained and is otherwise negative.   Past Medical History:  Past Medical History:  Diagnosis Date   . Renal cell carcinoma ([place]) 10/23/2015  . Skull lesion 10/20/2015   metastatic renal cell carcinoma    Past Surgical History: Past Surgical History:  Procedure Laterality Date  . robotic radical right nephrectomy      Social History:  Social History   Social History  . Marital status: Single    Spouse name: N/A  . Number of children: N/A  . 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 Smoking status: Current Every Day Smoker    Packs/day: 1.00    Years: 45.00    Types: Cigarettes  . Smokeless tobacco: Never Used  . Alcohol use 12.0 oz/week    20 Shots of liquor per week     Comment: 1-5 shot of liquor per night  . Drug use: Yes    Frequency: 1.0 time per week    Types: Marijuana     Comment: occasionally  . Sexual activity: Yes   Other Topics Concern  . Not on file   Social History Narrative  . No narrative on file    Family History: Family History  Problem Relation Age of Onset  . CAD Mother   . CAD Father   . Cancer Paternal Uncle     stomach cancer  . Cancer Paternal Uncle     unknown    ALLERGIES:  has No Known Allergies.  Meds: Current Outpatient Prescriptions  Medication Sig Dispense Refill  . ibuprofen (ADVIL,MOTRIN) 200 MG tablet Take 200 mg by mouth every 4 (four) hours as needed.    . Aspirin-Acetaminophen-Caffeine (GOODY HEADACHE PO) Take 1 packet by mouth as needed. Reported on 03/12/2016    . prochlorperazine (COMPAZINE) 10 MG tablet Take 1 tablet (10 mg total) by mouth every 6 (six) hours as needed for nausea or vomiting. (Patient not taking: Reported on 06/18/2016) 30 tablet 3  . SUNItinib (SUTENT) 50 MG capsule Take 1 capsule daily for 4 weeks in a row followed by a 2 week rest period. Repeat. (Patient not taking: Reported on 06/18/2016) 28 capsule 5   No current facility-administered medications for this encounter.     Physical Findings:  height is 5\' 7"  (1.702  m) and weight is 152 lb 9.6 oz (69.2 kg). His oral temperature is 99 F (37.2 C). His blood pressure is 153/90 (abnormal) and his pulse is 96. His respiration is 18 and oxygen saturation is 100%.   In general this is a well appearing Caucasian male in no acute distress. He is normocephalic, atraumatic. EOMS are intact. He's alert and oriented x4 and appropriate throughout the examination. Cardiopulmonary assessment is negative for acute distress and he exhibits normal effort. Skin is assessed again and he does have some ulceration of the right dorsal forearm as well as the right middle digit between the MIP and DIP joint. He appears neurologically intact grossly.   Lab Findings: Lab Results  Component Value Date   WBC 10.4 11/01/2015   HGB 17.9 (H) 11/01/2015   HCT 52.3 (H) 11/01/2015   PLT 227 11/01/2015    Lab Results  Component Value Date   NA 138 10/24/2015   K 4.0 10/24/2015   CO2 24 10/24/2015   GLUCOSE 102 (H) 10/24/2015   BUN 13 10/24/2015   CREATININE 1.30 (H) 02/20/2016   BILITOT 0.2 (L) 10/24/2015   ALKPHOS 83 10/24/2015   AST 15 10/24/2015   ALT 16 (L) 10/24/2015   PROT 7.3 10/24/2015   ALBUMIN 4.1 10/24/2015   CALCIUM 8.9 10/24/2015   ANIONGAP 7 10/24/2015    Radiographic Findings: Mr Jeri Cos [id number] Contrast  Result Date: 09/29/2016 CLINICAL DATA:  Metastatic renal cell carcinoma to base of skull. Status post SRS therapy ending 12/02/2015. EXAM: MRI HEAD WITHOUT AND WITH CONTRAST TECHNIQUE: Multiplanar, multiecho pulse sequences of the brain and surrounding structures were obtained without and with intravenous contrast. Creatinine was obtained on site at [facility] at [address] Results: Creatinine 1.2 mg/dL. CONTRAST:  64mL MULTIHANCE GADOBENATE DIMEGLUMINE 529 MG/ML IV SOLN COMPARISON:  06/14/2016 FINDINGS: Brain: No evidence of brain metastasis. No acute infarction, hemorrhage, hydrocephalus, extra-axial collection or mass lesion. Vascular: Major  vessels are patent, including the right ICA. Skull and upper cervical spine: Stable extent of right skullbase mass which extends from the occipital condyle superiorly to the petrous apex, clivus and basisphenoid. The mass encases the right hypoglossal and carotid canals without visible vessel narrowing or deformity. Cortex is eroded but there is no dural thickening or subarachnoid invasion. The non dominant right upper jugular and sigmoid sinus, in close proximity to the mass, are stable and patent. Sinuses/Orbits: Chronic right mastoid effusion attributed to eustachian tube dysfunction from the mass. IMPRESSION: 1. Stable treated metastasis in the right skullbase as described. Encased right carotid canal with stable patency of the ICA. 2. No evidence of new or brain metastasis. 3. Chronic right mastoid effusion. Electronically Signed   By: Angelica Chessman  Watts M.D.   On: 09/29/2016 15:10    Impression/Plan: 1. Stage Metastatic Renal Cell Carcinoma to the skull base. The patient appears to be doing well clinically as well as radiographically. His MRI scan will be repeated in 6 months time further recommendations are brain conference. He will notify us of any concerns or questions that he has prior to his next visit. He will also continue under the care of Dr. Marcello Moores at [facility] at this time, but is been overdue for systemic imaging. I will order this, and notify Dr. Marcello Moores. He may consider relocating his care back locally to [place]..  2. Right sided hearing loss.  3. Tobacco Usage. The patient is considering sensation but has been somewhat resistant to this as well. We will continue to follow this expectantly 4. Dermatologic Issues. The patient has not had this evaluated today, and has been encouraged to consider this.  In a visit lasting 25 minutes, greater than 50% of our time was spent face to face discussing options again to consider  tobacco use, and dermatologic concerns.    Carola Rhine,  PAC

## 2016-10-04 NOTE — Addendum Note (Signed)
Encounter addended by: Malena Edman, RN on: 10/04/2016  8:39 AM<BR>    Actions taken: Charge Capture section accepted

## 2016-10-10 ENCOUNTER — Telehealth: Payer: Self-pay | Admitting: *Deleted

## 2016-10-10 NOTE — Telephone Encounter (Signed)
CALLED PATIENT TO INFORM OF CT ON 10-15-16 - ARRIVAL TIME - 1:45 PM @ [facility], PT. TO BE NPO - 4 HRS. PRIOR TO TEST , PT. TO PICK-UP CONTRAST @ [facility] ON 10-11-16 OR 10-12-16, SPOKE WITH PATIENT AND HE IS AWARE OF THESE TESTS.

## 2016-10-15 ENCOUNTER — Ambulatory Visit
Admission: RE | Admit: 2016-10-15 | Discharge: 2016-10-15 | Disposition: A | Discharge status: Home / Self Care | Payer: Medicaid Other | Source: Ambulatory Visit | Attending: Radiation Oncology | Admitting: Radiation Oncology

## 2016-10-15 ENCOUNTER — Other Ambulatory Visit: Payer: Medicaid Other

## 2016-10-15 DIAGNOSIS — C641 Malignant neoplasm of right kidney, except renal pelvis: Secondary | ICD-10-CM

## 2016-10-15 MED ORDER — IOPAMIDOL (ISOVUE-300) INJECTION 61%
125.0000 mL | Freq: Once | INTRAVENOUS | Status: AC | PRN
  Administered 2016-10-15: 125 mL via INTRAVENOUS

## 2016-10-17 ENCOUNTER — Other Ambulatory Visit: Payer: Self-pay | Admitting: Radiation Oncology

## 2016-10-17 ENCOUNTER — Telehealth: Payer: Self-pay | Admitting: Radiation Oncology

## 2016-10-17 DIAGNOSIS — C641 Malignant neoplasm of right kidney, except renal pelvis: Secondary | ICD-10-CM

## 2016-10-17 DIAGNOSIS — C7951 Secondary malignant neoplasm of bone: Secondary | ICD-10-CM

## 2016-10-17 NOTE — Telephone Encounter (Signed)
Phoned patient per Gregory [name]'s order. Explained that overall Gregory scans from yesterday look good but, there is a new 4.5 mm nodule in Gregory right lung that we need to keep an eye on. Explained next steps are a medical oncology referral. Questioned if he has a preference to location or provider. Reports he spoke with Gregory daughter, [name], yesterday and they agreed he should see someone at [facility] because "its too hard to get back and forth to [place]. Patient understands Gregory Ha will place the referral and someone from scheduling will be in touch. Explained he will consult with a medical oncology then have a repeat scan. Patient verbalized understanding.

## 2016-10-19 ENCOUNTER — Encounter: Payer: Self-pay | Admitting: Oncology

## 2016-10-19 ENCOUNTER — Telehealth: Payer: Self-pay | Admitting: Oncology

## 2016-10-19 ENCOUNTER — Telehealth: Payer: Self-pay | Admitting: *Deleted

## 2016-10-19 NOTE — Telephone Encounter (Signed)
Appt has been scheduled w/Ms/ Enid Derry fro Radonc for the pt to see Dr. Alen Blew on 3/2 at 2pm. She will notify the pt of the appt. Letter mailed

## 2016-10-19 NOTE — Telephone Encounter (Signed)
CALLED PATIENT TO INFORM OF [facility] APPT. WITH DR. SHADAD ON 11-02-16 - ARRIVAL TIME - 1:30 PM, SPOKE WITH PATIENT AND HE IS AWARE OF THIS APPT.

## 2016-11-01 ENCOUNTER — Telehealth: Payer: Self-pay | Admitting: *Deleted

## 2016-11-01 NOTE — Telephone Encounter (Signed)
Called patient to remind him of his new patient appointment tomorrow. Patient verbalized understanding.

## 2016-11-02 ENCOUNTER — Telehealth: Payer: Self-pay | Admitting: Oncology

## 2016-11-02 ENCOUNTER — Ambulatory Visit (HOSPITAL_BASED_OUTPATIENT_CLINIC_OR_DEPARTMENT_OTHER): Payer: Medicaid Other | Admitting: Oncology

## 2016-11-02 VITALS — BP 130/83 | HR 87 | Temp 98.6°F | Resp 18 | Ht 67.0 in | Wt 152.9 lb

## 2016-11-02 DIAGNOSIS — D751 Secondary polycythemia: Secondary | ICD-10-CM

## 2016-11-02 DIAGNOSIS — R911 Solitary pulmonary nodule: Secondary | ICD-10-CM | POA: Diagnosis not present

## 2016-11-02 DIAGNOSIS — C7951 Secondary malignant neoplasm of bone: Secondary | ICD-10-CM

## 2016-11-02 DIAGNOSIS — C641 Malignant neoplasm of right kidney, except renal pelvis: Secondary | ICD-10-CM

## 2016-11-02 DIAGNOSIS — C649 Malignant neoplasm of unspecified kidney, except renal pelvis: Secondary | ICD-10-CM

## 2016-11-02 NOTE — Progress Notes (Signed)
Reason for Referral: Kidney cancer.   HPI: 61 year old gentleman currently of [place], [place] where he lived the majority of his life. He is a gentleman without any significant comorbid conditions with history of heavy tobacco use. He was in his usual state of health and sustained a trauma to his eye and caused him to develop double vision and subsequently diagnosed with VIth nerve palsy. He was evaluated by ophthalmology and an MRI of the brain demonstrated destructive right central posterior skull base mass measuring 5 cm in greatest dimension by 2.2 cm in the transverse with. It involves the base of the occiput, occipital condyle and the temporal bone. He was evaluated by Dr. Whitney Muse and imaging studies obtained on 10/21/2015 showed irregular enhancing mass of the right kidney consistent with a renal neoplasm.  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  He underwent radiation therapy for curative intent to the right skull base mass between 11/14/2015 and 11/23/2015 for a total of 25 grays. On 01/10/2017 he underwent a right radical nephrectomy at [facility] which showed a clear cell histology with Fuhrman grade 4. The tumor measures 6 cm in greatest dimension. She recovered well and had not reported any other treatment since that time. He was recommended to start Sutent at that time but declined to take it. He was evaluated by Dr. Wyvonna Plum [facility] and given the option of active treatment versus surveillance and he opted for surveillance at this time.  His most recent imaging studies obtained on 10/15/2016 included CT scan of the chest abdomen and pelvis which showed no evidence of metastatic disease. He does have a small pulmonary nodule that have been followed. 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 No evidence of any new brain  metastasis noted.  He wanted to establish medical oncology follow-up locally. Clinically, he reports no recent complaints. He denies any headaches or blurry vision or any neurological deficits. He remains active and ambulating without any major difficulties. Has not reported any cough, wheezing or dyspnea on exertion. Has not reported any decrease in his appetite or performance status.  He does not report any fevers, chills, sweats or weight loss. He does not report any chest pain, palpitation, orthopnea or leg edema. He does not report any cough, wheezing or hemoptysis. He does not report any nausea or vomiting abdominal pain. He does not report any frequency urgency or hesitancy. He does not report any skeletal complaints. Remaining review of systems unremarkable.   Past Medical History:  Diagnosis Date  . Renal cell carcinoma ([place]) 10/23/2015  . Skull lesion 10/20/2015   metastatic renal cell carcinoma  :  Past Surgical History:  Procedure Laterality Date  . robotic radical right nephrectomy    :   Current Outpatient Prescriptions:  .  Aspirin-Acetaminophen-Caffeine (GOODY HEADACHE PO), Take 1 packet by mouth as needed. Reported on 03/12/2016, Disp: , Rfl:  .  ibuprofen (ADVIL,MOTRIN) 200 MG tablet, Take 200 mg by mouth every 4 (four) hours as needed., Disp: , Rfl:  .  prochlorperazine (COMPAZINE) 10 MG tablet, Take 1 tablet (10 mg total) by mouth every 6 (six) hours as needed for nausea or vomiting. (Patient not taking: Reported on 06/18/2016), Disp: 30 tablet, Rfl: 3 .  SUNItinib (SUTENT) 50 MG capsule, Take 1 capsule daily for 4 weeks in a row followed by a 2 week rest period.  Repeat. (Patient not taking: Reported on 06/18/2016), Disp: 28 capsule, Rfl: 5:  No Known Allergies:  Family History  Problem Relation Age of Onset  . CAD Mother   . CAD Father   . Cancer Paternal Uncle     stomach cancer  . Cancer Paternal Uncle     unknown  :  Social History   Social History  .  Marital status: Single    Spouse name: N/A  . Number of children: N/A  . Years of education: N/A   Occupational History  . Electrician Other    [place] History Main Topics  . Smoking status: Current Every Day Smoker    Packs/day: 1.00    Years: 45.00    Types: Cigarettes  . Smokeless tobacco: Never Used  . Alcohol use 12.0 oz/week    20 Shots of liquor per week     Comment: 1-5 shot of liquor per night  . Drug use: Yes    Frequency: 1.0 time per week    Types: Marijuana     Comment: occasionally  . Sexual activity: Yes   Other Topics Concern  . Not on file   Social History Narrative  . No narrative on file  :  Pertinent items are noted in HPI.  Exam: Blood pressure 130/83, pulse 87, temperature 98.6 F (37 C), temperature source Oral, resp. rate 18, height 5\' 7"  (1.702 m), weight 152 lb 14.4 oz (69.4 kg), SpO2 99 %. General appearance: alert and cooperative Head: Normocephalic, without obvious abnormality Throat: lips, mucosa, and tongue normal; teeth and gums normal Neck: no adenopathy Back: negative Resp: clear to auscultation bilaterally Cardio: regular rate and rhythm, S1, S2 normal, no murmur, click, rub or gallop GI: soft, non-tender; bowel sounds normal; no masses,  no organomegaly Extremities: extremities normal, atraumatic, no cyanosis or edema Pulses: 2+ and symmetric  CBC    Component Value Date/Time   WBC 10.4 11/01/2015 0700   RBC 5.94 (H) 11/01/2015 0700   HGB 17.9 (H) 11/01/2015 0700   HCT 52.3 (H) 11/01/2015 0700   PLT 227 11/01/2015 0700   MCV 88.0 11/01/2015 0700   MCH 30.1 11/01/2015 0700   MCHC 34.2 11/01/2015 0700   RDW 13.5 11/01/2015 0700   LYMPHSABS 2.4 10/24/2015 1705   MONOABS 0.9 10/24/2015 1705   EOSABS 0.1 10/24/2015 1705   BASOSABS 0.1 10/24/2015 1705     Chemistry      Component Value Date/Time   NA 138 10/24/2015 1705   K 4.0 10/24/2015 1705   CL 107 10/24/2015 1705   CO2 24 10/24/2015 1705   BUN 13  10/24/2015 1705   CREATININE 1.30 (H) 02/20/2016 1422      Component Value Date/Time   CALCIUM 8.9 10/24/2015 1705   ALKPHOS 83 10/24/2015 1705   AST 15 10/24/2015 1705   ALT 16 (L) 10/24/2015 1705   BILITOT 0.2 (L) 10/24/2015 1705       Ct Chest W Contrast  Result Date: 10/16/2016 CLINICAL DATA:  Restaging renal cell cancer. Status post right nephrectomy February 2016. EXAM: CT CHEST, ABDOMEN, AND PELVIS WITH CONTRAST TECHNIQUE: Multidetector CT imaging of the chest, abdomen and pelvis was performed following the standard protocol during bolus administration of intravenous contrast. CONTRAST:  165mL ISOVUE-300 IOPAMIDOL (ISOVUE-300) INJECTION 61% COMPARISON:  CT scan 02/20/2016 FINDINGS: CT CHEST FINDINGS Chest wall: No chest wall mass, supraclavicular or axillary lymphadenopathy. Small scattered lymph nodes are noted. The thyroid gland is grossly normal. Cardiovascular: The  heart is normal in size. No pericardial effusion. The aorta is normal in caliber. No dissection. The branch vessels are patent. Scattered coronary artery calcifications. The pulmonary arteries are grossly normal. Mediastinum/Nodes: No mediastinal or hilar mass or lymphadenopathy. Small scattered lymph nodes are stable. Lungs/Pleura: Stable emphysematous changes and pulmonary scarring. No infiltrates or effusions. New small nodular density at the right lung apex on image number 22 measures 4.5 mm. Sub 3 mm nodule in the right upper lobe on image number 27 perifissural nodule on image number 76 in the right lower lobe is unchanged and likely a lymph node. No other significant pulmonary findings. Stable. Musculoskeletal: No significant bony findings. Scattered spinal hemangiomas are noted. No findings suspicious for osseous metastatic disease. CT ABDOMEN PELVIS FINDINGS Hepatobiliary: No focal hepatic lesions to suggest metastatic disease. Gallbladder is normal. No common bile duct dilatation. Pancreas: No mass, inflammation or  ductal dilatation. Spleen: Normal size.  No focal lesions. Adrenals/Urinary Tract: The adrenal glands are normal. The right kidney is surgically absent. The left kidney is unremarkable and stable. Stable small cysts and lower pole left renal calculus. Stomach/Bowel: The stomach, duodenum, small bowel and colon are unremarkable. No inflammatory changes, mass lesions or obstructive findings. The terminal ileum is normal. The appendix is normal. Vascular/Lymphatic: Scattered aortic atherosclerotic calcifications but no aneurysm or dissection. The branch vessels are patent. The major venous structures are patent. Small scattered mesenteric and retroperitoneal lymph nodes are stable. Stable celiac axis and periportal lymph nodes. Reproductive: The prostate gland and seminal vesicles are unremarkable. Other: No pelvic mass or adenopathy. No free pelvic fluid collections. No inguinal mass or adenopathy. No abdominal wall hernia or subcutaneous lesions. Musculoskeletal: Stable benign in lesions in the intertrochanteric regions of both hips. No new or worrisome bone lesions. Is IMPRESSION: 1. New 4.5 mm nodular density in the right upper lobe. Attention on future scans is suggested. 2. Stable perifissural nodule in the right lower lobe, likely benign lymph node. 3. Stable emphysematous changes and pulmonary scarring. 4. Status post right nephrectomy. No findings for abdominal/pelvic lymphadenopathy or metastatic disease. 5. Stable sclerotic bone lesions involving the intertrochanteric regions of both hips, likely benign. Electronically Signed   By: Marijo Sanes M.D.   On: 10/16/2016 08:32   Ct Abdomen Pelvis W Contrast  Result Date: 10/16/2016 CLINICAL DATA:  Restaging renal cell cancer. Status post right nephrectomy February 2016. EXAM: CT CHEST, ABDOMEN, AND PELVIS WITH CONTRAST TECHNIQUE: Multidetector CT imaging of the chest, abdomen and pelvis was performed following the standard protocol during bolus  administration of intravenous contrast. CONTRAST:  172mL ISOVUE-300 IOPAMIDOL (ISOVUE-300) INJECTION 61% COMPARISON:  CT scan 02/20/2016 FINDINGS: CT CHEST FINDINGS Chest wall: No chest wall mass, supraclavicular or axillary lymphadenopathy. Small scattered lymph nodes are noted. The thyroid gland is grossly normal. Cardiovascular: The heart is normal in size. No pericardial effusion. The aorta is normal in caliber. No dissection. The branch vessels are patent. Scattered coronary artery calcifications. The pulmonary arteries are grossly normal. Mediastinum/Nodes: No mediastinal or hilar mass or lymphadenopathy. Small scattered lymph nodes are stable. Lungs/Pleura: Stable emphysematous changes and pulmonary scarring. No infiltrates or effusions. New small nodular density at the right lung apex on image number 22 measures 4.5 mm. Sub 3 mm nodule in the right upper lobe on image number 27 perifissural nodule on image number 76 in the right lower lobe is unchanged and likely a lymph node. No other significant pulmonary findings. Stable. Musculoskeletal: No significant bony findings. Scattered spinal hemangiomas are noted.  No findings suspicious for osseous metastatic disease. CT ABDOMEN PELVIS FINDINGS Hepatobiliary: No focal hepatic lesions to suggest metastatic disease. Gallbladder is normal. No common bile duct dilatation. Pancreas: No mass, inflammation or ductal dilatation. Spleen: Normal size.  No focal lesions. Adrenals/Urinary Tract: The adrenal glands are normal. The right kidney is surgically absent. The left kidney is unremarkable and stable. Stable small cysts and lower pole left renal calculus. Stomach/Bowel: The stomach, duodenum, small bowel and colon are unremarkable. No inflammatory changes, mass lesions or obstructive findings. The terminal ileum is normal. The appendix is normal. Vascular/Lymphatic: Scattered aortic atherosclerotic calcifications but no aneurysm or dissection. The branch vessels are  patent. The major venous structures are patent. Small scattered mesenteric and retroperitoneal lymph nodes are stable. Stable celiac axis and periportal lymph nodes. Reproductive: The prostate gland and seminal vesicles are unremarkable. Other: No pelvic mass or adenopathy. No free pelvic fluid collections. No inguinal mass or adenopathy. No abdominal wall hernia or subcutaneous lesions. Musculoskeletal: Stable benign in lesions in the intertrochanteric regions of both hips. No new or worrisome bone lesions. Is IMPRESSION: 1. New 4.5 mm nodular density in the right upper lobe. Attention on future scans is suggested. 2. Stable perifissural nodule in the right lower lobe, likely benign lymph node. 3. Stable emphysematous changes and pulmonary scarring. 4. Status post right nephrectomy. No findings for abdominal/pelvic lymphadenopathy or metastatic disease. 5. Stable sclerotic bone lesions involving the intertrochanteric regions of both hips, likely benign. Electronically Signed   By: Marijo Sanes M.D.   On: 10/16/2016 08:32    Assessment and Plan:   61 year old gentleman with the following issues:  1. Renal cell carcinoma diagnosed in February 2017. He presented with base of the skull metastasis as well as a right kidney mass. 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  He has been on observation and surveillance since that time without any evidence of recurrent disease. Most recent CT scan in 10/15/2016 did not show any clear-cut widespread metastatic disease.  The natural course of this disease was discussed with the patient today. Systemic treatment options are available at this time and he would be a good candidate for them if needed to. These options would include tyrosine kinase inhibitors versus immunotherapy. I do favor observation and surveillance at this time given his lack of measurable disease and has a reasonable  disease-free interval without treatment.  He is scheduled to have repeat imaging studies in 6 months and I will see him after that. Systemic therapy will be considered if he develops measurable metastatic disease.  2. Base of the skull metastasis: He is status post radiation therapy completed in March 2017. He is followed by Dr. Tammi Klippel and his most recent MRI did not show any evidence of disease progression.  3. 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 I counseled him today about smoking cessation and importance for healthier lifestyle.  4. Follow-up: Will be in September sooner if needed to.

## 2016-11-02 NOTE — Telephone Encounter (Signed)
Appointments scheduled per 3/2 LOS. Patient given AVS report and calendars with future scheduled appointments. °

## 2017-02-21 ENCOUNTER — Telehealth: Payer: Self-pay | Admitting: Radiation Therapy

## 2017-02-21 ENCOUNTER — Other Ambulatory Visit: Payer: Self-pay | Admitting: Radiation Therapy

## 2017-02-21 DIAGNOSIS — M899 Disorder of bone, unspecified: Secondary | ICD-10-CM

## 2017-02-21 NOTE — Telephone Encounter (Signed)
Attempted calling Mr. Ciano, but his vm is not set up to leave a message. Then I called his daughter Lorenza Chick to let her know about his August follow-up MRI and visit. She made note of his appointments and will let him know.   MRI 8/2 and Rad Onc Follow-up 8/6.  Mont Dutton R.T.(R)(T) Special Procedures Navigator

## 2017-04-03 NOTE — Progress Notes (Signed)
Gregory [name]. 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   Headache:Reports having a rare headache. Fatigue: Having mild fatigue at times. Pain:None,has left side rib pain at night he had a fall about 5-6 week ago going up step and he fell all the weight shifted to to the left side the area is still sore to touch if he sleeps on the left side. Nausea/vomiting Ataxia(unsteady gait):  Vision (Blurred/double vision,blind spots, and peripheral vsion changes): Wears reading glasses. When he puts on glasses eye get fuzzy for a few minutes after he takes his glasses off. Right eye feels weak at times.  Right ear feels like something is crushing in the ear when he swallows and other times he can hear his heart beat in his right ear. Appetite:Good Weight: Wt Readings from Last 3 Encounters:  04/08/17 145 lb 9.6 oz (66 kg)  11/02/16 152 lb 14.4 oz (69.4 kg)  10/03/16 152 lb 9.6 oz (69.2 kg)   Imaging:04-04-17 MRI brain 11-02-16 Saw Dr. Alen Blew Follow-up: Will be in September sooner if needed  01-10-17 Right Nephrectomy radical robotic assisted laparoscopic,possible open  [facility] Dr. Clent Jacks   Will see next month fo a follow up visit. BP 118/89   Pulse 85   Temp 98.5 F (36.9 C) (Oral)   Resp 18   Ht 5\' 7"  (1.702 m)   Wt 145 lb 9.6 oz (66 kg)   SpO2 98%   BMI 22.80 kg/m

## 2017-04-04 ENCOUNTER — Ambulatory Visit
Admission: RE | Admit: 2017-04-04 | Discharge: 2017-04-04 | Disposition: A | Discharge status: Home / Self Care | Payer: Medicaid Other | Source: Ambulatory Visit | Attending: Radiation Oncology | Admitting: Radiation Oncology

## 2017-04-04 DIAGNOSIS — M899 Disorder of bone, unspecified: Secondary | ICD-10-CM

## 2017-04-04 MED ORDER — GADOBENATE DIMEGLUMINE 529 MG/ML IV SOLN
14.0000 mL | Freq: Once | INTRAVENOUS | Status: AC | PRN
  Administered 2017-04-04: 14 mL via INTRAVENOUS

## 2017-04-05 ENCOUNTER — Encounter: Payer: Self-pay | Admitting: Genetics

## 2017-04-08 ENCOUNTER — Ambulatory Visit
Admission: RE | Admit: 2017-04-08 | Discharge: 2017-04-08 | Disposition: A | Discharge status: Home / Self Care | Payer: Medicaid Other | Source: Ambulatory Visit | Attending: Radiation Oncology | Admitting: Radiation Oncology

## 2017-04-08 ENCOUNTER — Encounter: Payer: Self-pay | Admitting: Radiation Oncology

## 2017-04-08 VITALS — BP 118/89 | HR 85 | Temp 98.5°F | Resp 18 | Ht 67.0 in | Wt 145.6 lb

## 2017-04-08 DIAGNOSIS — Z85830 Personal history of malignant neoplasm of bone: Secondary | ICD-10-CM | POA: Insufficient documentation

## 2017-04-08 DIAGNOSIS — Z923 Personal history of irradiation: Secondary | ICD-10-CM | POA: Diagnosis not present

## 2017-04-08 DIAGNOSIS — Z08 Encounter for follow-up examination after completed treatment for malignant neoplasm: Secondary | ICD-10-CM | POA: Diagnosis present

## 2017-04-08 DIAGNOSIS — F17210 Nicotine dependence, cigarettes, uncomplicated: Secondary | ICD-10-CM | POA: Diagnosis not present

## 2017-04-08 DIAGNOSIS — C7951 Secondary malignant neoplasm of bone: Secondary | ICD-10-CM

## 2017-04-09 NOTE — Progress Notes (Signed)
Radiation Oncology         [phone]) [phone] ________________________________  Name: Gregory [name]. 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    ICD-10-CM   1. 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   11/14/2015-11/23/2015 to the right skull base target treated using 4 Rapid Arc VMAT Beams to a prescription dose of 5 Gy to be repeated until prescription dose of 25 Gy.  Narrative:  This is a pleasant 61 y.o. gentleman with a history if metastatic renal cell carcinoma who had radiotherapy with SRS to the brain in March of 2017. He underwent right nephrectomy in May of 2017 which revealed a 6 cm renal cell carcinoma. He has been stable with brain imaging. His most recent MRI on 04/04/17 reveals stability of his treated tumor. It still is noted to encase the internal carotid artery, and he does have right sided mastoid effusion by imaging. He transferred his care to Dr. Alen [name] and had systemic imaging in February 2018 which did not reveal concerns for persistent or new disease.   On review of systems, the patient reports that he is doing well overall. He denies any chest pain, shortness of breath, cough, fevers, chills, night sweats, unintended weight changes.He denies any bowel or bladder disturbances, and denies abdominal pain, nausea or vomiting. He denies any new musculoskeletal or joint aches or pains, new skin lesions or concerns. He continues to have right sided hearing loss. He denies any changes in vision, speech, consciousness, new dizziness, or changes in gait.  A complete review of systems is obtained and is otherwise negative.   Past Medical History:  Past Medical History:  Diagnosis Date  . Renal cell carcinoma ([place]) 10/23/2015  . Skull lesion 10/20/2015   metastatic renal cell carcinoma    Past Surgical History: Past Surgical History:  Procedure Laterality Date  . robotic radical right nephrectomy      Social History:  Social History   Social History  . Marital status: Single    Spouse name: N/A  . Number of children: N/A  . 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 Smoking status: Current Every Day Smoker    Packs/day: 1.00    Years: 45.00    Types: Cigarettes  . Smokeless tobacco: Never Used  . Alcohol use 12.0 oz/week    20 Shots of liquor per week     Comment: 1-5 shot of liquor per night  . Drug use: Yes    Frequency: 1.0 time per week    Types: Marijuana     Comment: occasionally  . Sexual activity: Yes   Other Topics Concern  . Not on file   Social History Narrative  . No narrative on file    Family History: Family History  Problem Relation Age of Onset  . CAD Mother   . CAD Father   . Cancer Paternal Uncle        stomach cancer  . Cancer Paternal Uncle        unknown    ALLERGIES:  has No Known Allergies.  Meds: Current Outpatient Prescriptions  Medication Sig Dispense Refill  . ibuprofen (ADVIL,MOTRIN) 200  MG tablet Take 200 mg by mouth every 4 (four) hours as needed.     No current facility-administered medications for this encounter.     Physical Findings:  height is 5\' 7"  (1.702 m) and weight is 145 lb 9.6 oz (66 kg). His oral temperature is 98.5 F (36.9 C). His blood pressure is 118/89 and his pulse is 85. His respiration is 18 and oxygen saturation is 98%.   In general this is a well appearing Caucasian male in no acute distress. He is normocephalic, atraumatic. EOMS are intact. He's alert and oriented x4 and appropriate throughout the examination. Bilateral ears are asssed and cerumen impaction of the right external auditory canal is noted. The left is clear and TM is normal in appearance. With a tuning fork his  air conduction is better than bone conduction bilaterally. Cardiopulmonary assessment is negative for acute distress and he exhibits normal effort. Skin is assessed and continues to have ulceration of the right dorsal forearm as well as the right middle digit between the MIP and DIP joint. He appears neurologically intact grossly.   Lab Findings: Lab Results  Component Value Date   WBC 10.4 11/01/2015   HGB 17.9 (H) 11/01/2015   HCT 52.3 (H) 11/01/2015   PLT 227 11/01/2015    Lab Results  Component Value Date   NA 138 10/24/2015   K 4.0 10/24/2015   CO2 24 10/24/2015   GLUCOSE 102 (H) 10/24/2015   BUN 13 10/24/2015   CREATININE 1.30 (H) 02/20/2016   BILITOT 0.2 (L) 10/24/2015   ALKPHOS 83 10/24/2015   AST 15 10/24/2015   ALT 16 (L) 10/24/2015   PROT 7.3 10/24/2015   ALBUMIN 4.1 10/24/2015   CALCIUM 8.9 10/24/2015   ANIONGAP 7 10/24/2015    Radiographic Findings: Mr Gregory [name] JK Contrast  Result Date: 04/04/2017 CLINICAL DATA:  Metastatic renal cell carcinoma. Stereotactic radiosurgery for metastatic carcinoma to skullbase. Six-month follow-up SRS restaging Creatinine was obtained on site at [place] at [address] Results: Creatinine 1.1 mg/dL. EXAM: MRI HEAD WITHOUT AND WITH CONTRAST TECHNIQUE: Multiplanar, multiecho pulse sequences of the brain and surrounding structures were obtained without and with intravenous contrast. CONTRAST:  16mL MULTIHANCE GADOBENATE DIMEGLUMINE 529 MG/ML IV SOLN COMPARISON:  MRI 09/29/2016, 06/14/2016 FINDINGS: Brain: Ventricle size normal. Negative for acute or chronic infarction. Negative for hemorrhage, mass, or edema. No enhancing metastatic deposits in the brain Vascular: Normal arterial flow void. Skull and upper cervical spine: Skullbase lesion on the right is unchanged. There is enhancing tumor in the right side of the clivus extending into the petrous bone and down to the foramina magnum on the right. Tumor within the right  hypoglossal canal unchanged. No extradural tumor. Tumor abuts the posterior surface of the carotid canal on the right unchanged. No new skull lesions Sinuses/Orbits: Mucosal edema paranasal sinuses with improvement. Improvement in right mastoid effusion Other: None IMPRESSION: Stable skullbase tumor on the right. Negative for metastatic disease to the brain Improvement in mucosal edema in the paranasal sinuses and right mastoid sinus effusion. Electronically Signed   By: Franchot Gallo M.D.   On: 04/04/2017 11:04    Impression/Plan: 1. Stage Metastatic Renal Cell Carcinoma to the skull base. The patient continues to do well clinically as well as radiographically. His MRI scan was reviewed. He is encouraged to have this repeated in 6 months time. He will also follow up with Dr. Alen [name] later this year and will have systemic imaging per Dr. Alen [name].  2. Right sided hearing loss. The patient has cerumen impaction and will try otc debrox solution. If this does not improve with flushing out the cerumen, or if he's unable to flush this out, he will call us for an ENT referral and for audiology testing.  3. Tobacco Usage. The patient has been counseled previously on tobacco cessation. He continues to consider this but is not making changes currently.  4. Dermatologic Issues. Again the patient does not have interest in having his skin evaluated by dermatology. We will follow this expectantly.  In a visit lasting 25 minutes, greater than 50% of our time was spent face to face discussing his MRI and options to have his hearing assessed.    Carola Rhine, PAC

## 2017-05-08 ENCOUNTER — Ambulatory Visit (HOSPITAL_BASED_OUTPATIENT_CLINIC_OR_DEPARTMENT_OTHER): Payer: Medicaid Other | Admitting: Oncology

## 2017-05-08 ENCOUNTER — Telehealth: Payer: Self-pay | Admitting: Oncology

## 2017-05-08 VITALS — BP 110/69 | HR 80 | Temp 99.2°F | Resp 18 | Ht 67.0 in | Wt 146.3 lb

## 2017-05-08 DIAGNOSIS — Z8553 Personal history of malignant neoplasm of renal pelvis: Secondary | ICD-10-CM

## 2017-05-08 DIAGNOSIS — Z720 Tobacco use: Secondary | ICD-10-CM

## 2017-05-08 DIAGNOSIS — C649 Malignant neoplasm of unspecified kidney, except renal pelvis: Secondary | ICD-10-CM

## 2017-05-08 DIAGNOSIS — D751 Secondary polycythemia: Secondary | ICD-10-CM | POA: Diagnosis not present

## 2017-05-08 NOTE — Progress Notes (Signed)
Hematology and Oncology Follow Up Visit  Gregory [name] [id number] March 06, 1956 61 y.o. 05/08/2017 9:35 AM Patient, No Pcp PerNo ref. provider found   Principle Diagnosis: 61 year old gentleman diagnosed with renal cell carcinoma in 2017.He presented with base of the skull metastasis as well as a right kidney mass. 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   Prior Therapy:  He underwent radiation therapy for curative intent to the right skull base mass between 11/14/2015 and 11/23/2015 for a total of 25 grays.   On 01/10/2017 he underwent a right radical nephrectomy at [facility] which showed a clear cell histology with Fuhrman grade 4. The tumor measures 6 cm in greatest dimension. She recovered well and had not reported any other treatment since that time.   Current therapy: Observation and surveillance.  Interim History: Gregory [name] presents today for a follow-up visit. Since her last visit, he reports no complaints. He remains reasonably active and attends to activities of daily living. He denied any headaches, blurry vision or syncope. His appetite and performance status remains at baseline. He had an MRI done on 04/04/2017 showed no evidence of recurrent disease.  He does not report any fevers, chills, sweats or weight loss. He does not report any chest pain, palpitation, orthopnea or leg edema. He does not report any cough, wheezing or hemoptysis. He does not report any nausea or vomiting abdominal pain. He does not report any frequency urgency or hesitancy. He does not report any skeletal complaints. Remaining review of systems unremarkable.   Medications: I have reviewed the patient's current medications.  Current Outpatient Prescriptions  Medication Sig Dispense Refill  . ibuprofen (ADVIL,MOTRIN) 200 MG tablet Take 200 mg by mouth every 4 (four) hours as needed.     No  current facility-administered medications for this visit.      Allergies: No Known Allergies  Past Medical History, Surgical history, Social history, and Family History were reviewed and updated.  [name] Physical Exam: Blood pressure 110/69, pulse 80, temperature 99.2 F (37.3 C), temperature source Oral, resp. rate 18, height 5\' 7"  (1.702 m), weight 146 lb 4.8 oz (66.4 kg), SpO2 99 %. ECOG: 0 General appearance: alert and cooperative appeared without distress. Head: Normocephalic, without obvious abnormality Neck: no adenopathy Lymph nodes: Cervical, supraclavicular, and axillary nodes normal. Heart:regular rate and rhythm, S1, S2 normal, no murmur, click, rub or gallop Lung:chest clear, no wheezing, rales, normal symmetric air entry Abdomin: soft, non-tender, without masses or organomegaly EXT:no erythema, induration, or nodules   Lab Results: Lab Results  Component Value Date   WBC 10.4 11/01/2015   HGB 17.9 (H) 11/01/2015   HCT 52.3 (H) 11/01/2015   MCV 88.0 11/01/2015   PLT 227 11/01/2015     Chemistry      Component Value Date/Time   NA 138 10/24/2015 1705   K 4.0 10/24/2015 1705   CL 107 10/24/2015 1705   CO2 24 10/24/2015 1705   BUN 13 10/24/2015 1705   CREATININE 1.30 (H) 02/20/2016 1422      Component Value Date/Time   CALCIUM 8.9 10/24/2015 1705   ALKPHOS 83 10/24/2015 1705   AST 15 10/24/2015 1705   ALT 16 (L) 10/24/2015 1705   BILITOT 0.2 (L) 10/24/2015 1705          Impression and Plan:  61 year old gentleman with the following issues:  1. Renal cell carcinoma diagnosed in February 2017. He  presented with base of the skull metastasis as well as a right kidney mass. 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  He has been on observation and surveillance since that time without any evidence of recurrent disease. CT scan in 10/15/2016 did not show any clear-cut widespread  metastatic disease.  The plan is to continue active surveillance at this time and repeat imaging studies in the immediate future. He used to be followed at [facility] and would like to get his surveillance care moving forward locally. I will obtain a CT scan chest abdomen pelvis in the immediate future and we will repeat every 6 months. Systemic therapy will be recommended if he develops systemic disease.  2. Base of the skull metastasis: He is status post radiation therapy completed in March 2017. He is followed by Dr. Tammi Klippel and MRI obtain in August 2018 showed no recurrent disease.  3. 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 I counseled him today about smoking cessation and importance for healthier lifestyle.  4. Follow-up: In the next few weeks to repeat staging CT scan.       Zola Button, MD 9/5/20189:35 AM

## 2017-05-08 NOTE — Telephone Encounter (Signed)
Gave avs and calendar for September and march

## 2017-05-15 ENCOUNTER — Other Ambulatory Visit (HOSPITAL_BASED_OUTPATIENT_CLINIC_OR_DEPARTMENT_OTHER): Payer: Medicaid Other

## 2017-05-15 DIAGNOSIS — Z8553 Personal history of malignant neoplasm of renal pelvis: Secondary | ICD-10-CM | POA: Diagnosis not present

## 2017-05-15 DIAGNOSIS — C649 Malignant neoplasm of unspecified kidney, except renal pelvis: Secondary | ICD-10-CM

## 2017-05-15 LAB — CBC WITH DIFFERENTIAL/PLATELET
BASO%: 0.9 % (ref 0.0–2.0)
Basophils Absolute: 0.1 10*3/uL (ref 0.0–0.1)
EOS%: 1.5 % (ref 0.0–7.0)
Eosinophils Absolute: 0.1 10*3/uL (ref 0.0–0.5)
HCT: 51.9 % — ABNORMAL HIGH (ref 38.4–49.9)
HEMOGLOBIN: 17.5 g/dL — AB (ref 13.0–17.1)
LYMPH%: 18.9 % (ref 14.0–49.0)
MCH: 30.1 pg (ref 27.2–33.4)
MCHC: 33.8 g/dL (ref 32.0–36.0)
MCV: 89.1 fL (ref 79.3–98.0)
MONO#: 0.9 10*3/uL (ref 0.1–0.9)
MONO%: 9.6 % (ref 0.0–14.0)
NEUT%: 69.1 % (ref 39.0–75.0)
NEUTROS ABS: 6.3 10*3/uL (ref 1.5–6.5)
Platelets: 199 10*3/uL (ref 140–400)
RBC: 5.82 10*6/uL (ref 4.20–5.82)
RDW: 13.5 % (ref 11.0–14.6)
WBC: 9.1 10*3/uL (ref 4.0–10.3)
lymph#: 1.7 10*3/uL (ref 0.9–3.3)

## 2017-05-15 LAB — COMPREHENSIVE METABOLIC PANEL
ALT: 12 U/L (ref 0–55)
AST: 12 U/L (ref 5–34)
Albumin: 3.5 g/dL (ref 3.5–5.0)
Alkaline Phosphatase: 95 U/L (ref 40–150)
Anion Gap: 8 mEq/L (ref 3–11)
BUN: 13.4 mg/dL (ref 7.0–26.0)
CO2: 24 meq/L (ref 22–29)
Calcium: 9.2 mg/dL (ref 8.4–10.4)
Chloride: 103 mEq/L (ref 98–109)
Creatinine: 1.2 mg/dL (ref 0.7–1.3)
EGFR: 66 mL/min/{1.73_m2} — ABNORMAL LOW (ref >90)
GLUCOSE: 91 mg/dL (ref 70–140)
POTASSIUM: 4.4 meq/L (ref 3.5–5.1)
SODIUM: 136 meq/L (ref 136–145)
TOTAL PROTEIN: 7 g/dL (ref 6.4–8.3)
Total Bilirubin: 0.55 mg/dL (ref 0.20–1.20)

## 2017-05-16 ENCOUNTER — Ambulatory Visit (HOSPITAL_COMMUNITY): Payer: Medicaid Other

## 2017-05-16 ENCOUNTER — Other Ambulatory Visit: Payer: Medicaid Other

## 2017-08-16 ENCOUNTER — Ambulatory Visit (HOSPITAL_COMMUNITY): Payer: Medicaid Other

## 2017-08-16 ENCOUNTER — Other Ambulatory Visit: Payer: Medicaid Other

## 2017-09-13 ENCOUNTER — Other Ambulatory Visit: Payer: Self-pay | Admitting: Radiation Therapy

## 2017-09-13 DIAGNOSIS — C7951 Secondary malignant neoplasm of bone: Secondary | ICD-10-CM

## 2017-10-09 NOTE — Progress Notes (Signed)
Gregory [name]. 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   Headache: States that he has headaches sometime. Fatigue: States that he has mild fatigue. Pain: Denies any other pain beside headaches. Nausea/vomiting Ataxia(unsteady gait): Denies any Vision (Blurred/double vision,blind spots, and peripheral vsion changes): Wears reading glasses. When he puts on glasses eye get fuzzy for a few minutes after he takes his glasses off. Right eye feels weak at times.  Right ear feels like something is crushing in the ear when he swallows and other times he can hear his heart beat in his right ear. Appetite:States appetite is ok Weight: Denies any issues Vitals:   10/14/17 0937  BP: 137/87  Pulse: 80  Resp: 18  Temp: 98.5 F (36.9 C)  TempSrc: Oral  SpO2: 100%  Weight: 153 lb 8 oz (69.6 kg)   Wt Readings from Last 3 Encounters:  10/14/17 153 lb 8 oz (69.6 kg)  05/08/17 146 lb 4.8 oz (66.4 kg)  04/08/17 145 lb 9.6 oz (66 kg)

## 2017-10-10 ENCOUNTER — Ambulatory Visit
Admission: RE | Admit: 2017-10-10 | Discharge: 2017-10-10 | Disposition: A | Discharge status: Home / Self Care | Payer: Medicaid Other | Source: Ambulatory Visit | Attending: Radiation Oncology | Admitting: Radiation Oncology

## 2017-10-10 DIAGNOSIS — C7951 Secondary malignant neoplasm of bone: Secondary | ICD-10-CM

## 2017-10-10 MED ORDER — GADOBENATE DIMEGLUMINE 529 MG/ML IV SOLN
13.0000 mL | Freq: Once | INTRAVENOUS | Status: AC | PRN
  Administered 2017-10-10: 13 mL via INTRAVENOUS

## 2017-10-14 ENCOUNTER — Other Ambulatory Visit: Payer: Self-pay

## 2017-10-14 ENCOUNTER — Ambulatory Visit
Admission: RE | Admit: 2017-10-14 | Discharge: 2017-10-14 | Disposition: A | Discharge status: Home / Self Care | Payer: Medicaid Other | Source: Ambulatory Visit | Attending: Radiation Oncology | Admitting: Radiation Oncology

## 2017-10-14 ENCOUNTER — Encounter: Payer: Self-pay | Admitting: Radiation Oncology

## 2017-10-14 VITALS — BP 137/87 | HR 80 | Temp 98.5°F | Resp 18 | Wt 153.5 lb

## 2017-10-14 DIAGNOSIS — Z809 Family history of malignant neoplasm, unspecified: Secondary | ICD-10-CM | POA: Diagnosis not present

## 2017-10-14 DIAGNOSIS — Z801 Family history of malignant neoplasm of trachea, bronchus and lung: Secondary | ICD-10-CM | POA: Insufficient documentation

## 2017-10-14 DIAGNOSIS — C641 Malignant neoplasm of right kidney, except renal pelvis: Secondary | ICD-10-CM | POA: Insufficient documentation

## 2017-10-14 DIAGNOSIS — F17210 Nicotine dependence, cigarettes, uncomplicated: Secondary | ICD-10-CM | POA: Diagnosis not present

## 2017-10-14 DIAGNOSIS — C7951 Secondary malignant neoplasm of bone: Secondary | ICD-10-CM | POA: Insufficient documentation

## 2017-10-14 DIAGNOSIS — C649 Malignant neoplasm of unspecified kidney, except renal pelvis: Secondary | ICD-10-CM

## 2017-10-14 DIAGNOSIS — Z923 Personal history of irradiation: Secondary | ICD-10-CM | POA: Insufficient documentation

## 2017-10-14 NOTE — Progress Notes (Signed)
Radiation Oncology         [phone]) [phone] ________________________________  Name: Gregory [name]. 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    ICD-10-CM   1. 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  Narrative:  This is a pleasant 62 y.o. gentleman with a history if metastatic renal cell carcinoma who had radiotherapy with SRS to the brain in March of 2017. He underwent right nephrectomy in May of 2017 which revealed a 6 cm renal cell carcinoma. He transferred his care to Dr. Alen Blew last fall, and has been followed without systemic therapy in observation. His last MRI on 10/10/17 revealed stability of his treated skull based metastasis. He is due to see Dr. Alen Blew and for systemic imaging.  On review of systems, the patient reports that he is doing well overall. He still has some weakness of his right eye but reports this is stable. He denies any headaches or visual changes. He reports his cerumen impaction resolved with OTC debrox. He occasionally has a popping sensation in the ear but reports this resolved today. He denies any chest pain, shortness of breath, cough, fevers, chills, night sweats, unintended weight changes. He denies any bowel or bladder disturbances, and denies abdominal pain, nausea or vomiting. He denies any new musculoskeletal or joint aches or pains, new skin lesions or concerns. A complete review of systems is obtained and is otherwise negative.    Past Medical History:  Past Medical History:  Diagnosis Date  . Renal cell carcinoma ([place]) 10/23/2015  . Skull lesion 10/20/2015     metastatic renal cell carcinoma    Past Surgical History: Past Surgical History:  Procedure Laterality Date  . robotic radical right nephrectomy      Social History:  Social History   Socioeconomic History  . Marital status: Single    Spouse name: Not on file  . Number of children: Not on file  . Years of education: Not on file  . Highest education level: Not on file  Social Needs  . Financial resource strain: Not on file  . Food insecurity - worry: Not on file  . Food insecurity - inability: Not on file  . Transportation needs - medical: Not on file  . Transportation needs - non-medical: Not on file  Occupational History  . Occupation: Programmer, systems: OTHER    Comment: [employer]  Tobacco Use  . Smoking status: Current Every Day Smoker    Packs/day: 1.00    Years: 45.00    Pack years: 45.00    Types: Cigarettes  . Smokeless tobacco: Never Used  Substance and Sexual Activity  . Alcohol use: Yes    Alcohol/week: 12.0 oz    Types: 20 Shots of liquor per week    Comment: 1-5 shot of liquor per night  . Drug use: Yes    Frequency: 1.0 times per week    Types: Marijuana    Comment: occasionally  . Sexual activity: Yes  Other Topics Concern  . Not on file  Social History Narrative  .  Not on file  The patient is single. He lives in [place]. He continues to smoke despite counseling.  Family History: Family History  Problem Relation Age of Onset  . CAD Mother   . CAD Father   . Cancer Paternal Uncle        stomach cancer  . Cancer Paternal Uncle        unknown    ALLERGIES:  has No Known Allergies.  Meds: Current Outpatient Medications  Medication Sig Dispense Refill  . ibuprofen (ADVIL,MOTRIN) 200 MG tablet Take 200 mg by mouth every 4 (four) hours as needed.     No current facility-administered medications for this encounter.     Physical Findings:  weight is 153 lb 8 oz (69.6 kg). His oral temperature is 98.5 F (36.9 C). His blood  pressure is 137/87 and his pulse is 80. His respiration is 18 and oxygen saturation is 100%.   In general this is a well appearing Caucasian male in no acute distress. He is normocephalic, atraumatic. EOMS are intact. He's alert and oriented x4 and appropriate throughout the examination. Cardiopulmonary assessment is negative for acute distress and he exhibits normal effort. Skin is assessed and continues to have ulceration of the right dorsal forearm as well as the right middle digit between the MIP and DIP joint. This is unchanged. He appears neurologically intact grossly.   Lab Findings: Lab Results  Component Value Date   WBC 9.1 05/15/2017   WBC 10.4 11/01/2015   HGB 17.5 (H) 05/15/2017   HCT 51.9 (H) 05/15/2017   PLT 199 05/15/2017    Lab Results  Component Value Date   NA 136 05/15/2017   K 4.4 05/15/2017   CHLORIDE 103 05/15/2017   CO2 24 05/15/2017   GLUCOSE 91 05/15/2017   BUN 13.4 05/15/2017   CREATININE 1.2 05/15/2017   BILITOT 0.55 05/15/2017   ALKPHOS 95 05/15/2017   AST 12 05/15/2017   ALT 12 05/15/2017   PROT 7.0 05/15/2017   ALBUMIN 3.5 05/15/2017   CALCIUM 9.2 05/15/2017   ANIONGAP 8 05/15/2017   ANIONGAP 7 10/24/2015    Radiographic Findings: Mr Jeri Cos FV Contrast  Result Date: 10/10/2017 CLINICAL DATA:  Surveillance of metastatic renal cell carcinoma. EXAM: MRI HEAD WITHOUT AND WITH CONTRAST TECHNIQUE: Multiplanar, multisequence MR imaging of the brain was performed with and without contrast. Creatinine was obtained on site at [facility] at [address] Results: Creatinine 1.1 mg/dL. CONTRAST:  74mL MULTIHANCE GADOBENATE DIMEGLUMINE 529 MG/ML IV SOLN COMPARISON:  None. FINDINGS: BRAIN: No evidence of metastatic disease. No infarct, hemorrhage, hydrocephalus, or collection. Vascular: Major flow voids are preserved, including the right ICA. Skull and upper cervical spine: There is a known enhancing metastasis extending from the right occipital  condyle to the level of the right clivus and sella, encompassing the hypoglossal canal and carotid canal. There is no evidence of dural extension or progression. No evidence of new bony metastasis. Sinuses/Orbits: Partial right mastoid opacification that is diminished. The mass does not visibly encroach on the eustachian tube and this may be radiation related. IMPRESSION: 1. Stable right skull base metastasis as described. 2. No new site of metastatic disease. Electronically Signed   By: Monte Fantasia M.D.   On: 10/10/2017 12:58    Impression/Plan: 1. Stage Metastatic Renal Cell Carcinoma to the skull base. The patient continues to do well clinically as well as radiographically based on MRI. He will proceed with restaging imaging and I've reordered these to  occur prior to Dr. Hazeline Junker visit on 11/05/17. He is in agreement and requests these scans be done in [place]. We will plan to see him in 6 months time for follow up after his next surveillance MRI, but sooner if needed. He is counseled on the location of his treated tumor and given precautions regarding stroke risks.  2. Tobacco Usage. The patient is not interested in making changes currently, but continues to receive counseling regarding tobacco use. 3. Dermatologic Issues. Again the patient does not have interest in having his skin evaluated by dermatology. We will follow this expectantly.     Carola Rhine, PAC

## 2017-11-05 ENCOUNTER — Ambulatory Visit: Payer: Medicaid Other | Admitting: Oncology

## 2017-12-20 ENCOUNTER — Other Ambulatory Visit: Payer: Self-pay | Admitting: Radiation Therapy

## 2017-12-20 DIAGNOSIS — C7952 Secondary malignant neoplasm of bone marrow: Principal | ICD-10-CM

## 2017-12-20 DIAGNOSIS — C7951 Secondary malignant neoplasm of bone: Secondary | ICD-10-CM

## 2018-04-10 ENCOUNTER — Telehealth: Payer: Self-pay | Admitting: Radiation Therapy

## 2018-04-10 ENCOUNTER — Inpatient Hospital Stay: Admission: RE | Admit: 2018-04-10 | Payer: Medicare Other | Source: Ambulatory Visit

## 2018-04-10 NOTE — Telephone Encounter (Signed)
[name] did not have his brain MRI scan today. I called to check in on him and see why he missed the scan. He said that there is a problem with his Medicare coverage, and he would have to come up with over $1000 out of pocket for payment. He cancelled the scan and does not want to reschedule until this issue is resolved.   I have cancelled the follow-up with [name], and asked Ailene Ravel to take a look at his insurance coverage in Epic to see if she sees any issues.   Mr. [name] is not having any symptoms or complaints at this time. He said that he is "just fine".  I have also asked him to let us know when he is ready for [place] to get his scan and follow-up rescheduled.  He was thankful for the call.   Mont Dutton R.T.(R)(T) Special Procedures Navigator

## 2018-04-15 ENCOUNTER — Inpatient Hospital Stay
Admission: RE | Admit: 2018-04-15 | Discharge: 2018-04-15 | Disposition: A | Discharge status: Home / Self Care | Payer: Self-pay | Source: Ambulatory Visit | Attending: Urology | Admitting: Urology

## 2018-04-17 ENCOUNTER — Encounter: Payer: Self-pay | Admitting: Radiation Therapy

## 2018-04-17 NOTE — Progress Notes (Signed)
Mr. Dancel cancelled his brain MRI and follow-up due to a lapse in his Medicaid coverage. He will need to go by the social security office to get that reinstated. Ailene Ravel Hobgood has reached out to him about this and he plans to call us back when this has been done so we can get the MRI rescheduled.  I asked him how he is doing, he said that he is doing well, no symptoms or concerns at this time.   Meredith and I will check back in with him to help make sure this is resolved.   Mont Dutton R.T.(R)(T) Special Procedures Navigator

## 2018-06-27 ENCOUNTER — Encounter: Payer: Self-pay | Admitting: Radiation Therapy

## 2018-06-27 NOTE — Progress Notes (Signed)
Sent reminder card for upcoming MRI and Follow-up with [name] in November.  Mont Dutton R.T.(R)(T) Special Procedures Navigator

## 2018-07-08 ENCOUNTER — Other Ambulatory Visit: Payer: Medicare Other

## 2018-07-15 ENCOUNTER — Inpatient Hospital Stay
Admission: RE | Admit: 2018-07-15 | Discharge: 2018-07-15 | Disposition: A | Discharge status: Home / Self Care | Payer: Self-pay | Source: Ambulatory Visit | Attending: Urology | Admitting: Urology

## 2018-07-28 ENCOUNTER — Telehealth: Payer: Self-pay | Admitting: Radiation Therapy

## 2018-07-28 NOTE — Telephone Encounter (Signed)
Called to get the missed brain MRI and follow-up rescheduled. Left a voicemail requesting a call back.   Mont Dutton R.T.(R)(T)

## 2018-10-02 ENCOUNTER — Encounter: Payer: Self-pay | Admitting: Radiation Therapy

## 2018-10-02 NOTE — Progress Notes (Signed)
I have left multiple phone messages for Mr. Hausman requesting a return call to get his MRI and Rad Onc follow-up visits rescheduled. I have also sent out 2 cards and will send out a third today, requesting he call me back.  Mont Dutton R.T.(R)(T) Special Procedures Navigator

## 2019-07-25 IMAGING — MR MR HEAD WO/W CM
11 series · 48 of 48 positions shown · IV contrast (multihance)
Comparison: MRI 09/29/2016, 06/14/2016

CLINICAL DATA: Metastatic renal cell carcinoma. Stereotactic
radiosurgery for metastatic carcinoma to skullbase. Six-month
follow-up SRS restaging

Creatinine was obtained on site at [HOSPITAL] at [HOSPITAL].
Results: Creatinine 1.1 mg/dL.
EXAM:
MRI HEAD WITHOUT AND WITH CONTRAST
TECHNIQUE: Multiplanar, multiecho pulse sequences of the brain and surrounding
structures were obtained without and with intravenous contrast.
CONTRAST:  14mL MULTIHANCE GADOBENATE DIMEGLUMINE 529 MG/ML IV SOLN

[Series 2: FLAIR · sagittal · 3.0mm · 0.75mm/px · 2 of 42 slices shown (1 of 2)]
[im 1/42]
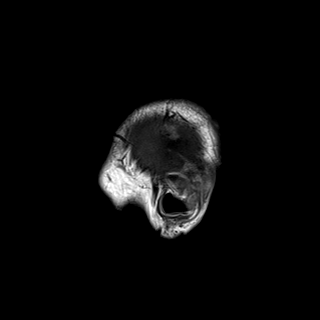
[im 42/42]
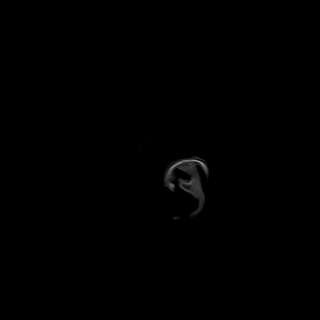

[Series 3: DWI · axial · 3.0mm · 1.50mm/px · z∈[-71,+93]mm · 6 of 86 slices shown (1 of 2)]
[im 1/86]
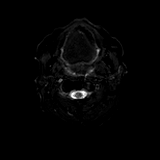
[im 18/86]
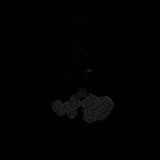
[im 35/86]
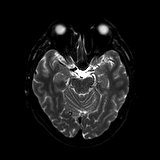
[im 52/86]
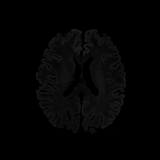
[im 69/86]
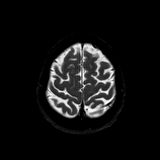
[im 86/86]
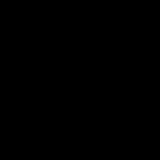

[Series 4: DWI · axial · 3.0mm · 1.50mm/px · z∈[-71,+93]mm · 3 of 43 slices shown (2 of 2)]
[im 1/43]
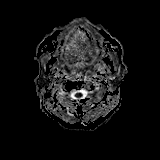
[im 22/43]
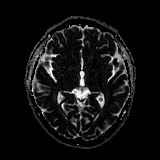
[im 43/43]
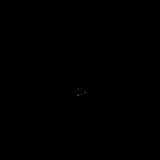

[Series 5: T2 · axial · 5.0mm · 0.57mm/px · z∈[-85,+82]mm · 2 of 29 slices shown]
[im 1/29]
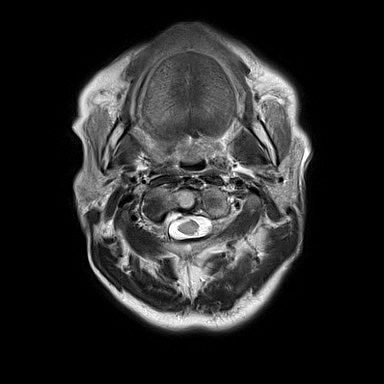
[im 29/29]
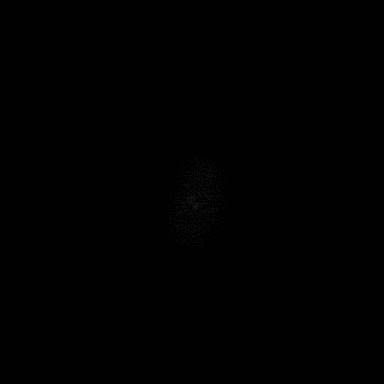

[Series 6: GRE · axial · 5.0mm · 0.57mm/px · z∈[-85,+82]mm · 2 of 29 slices shown]
[im 1/29]
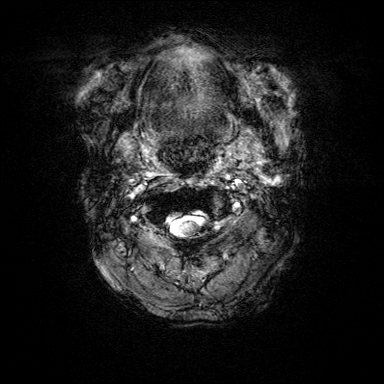
[im 29/29]
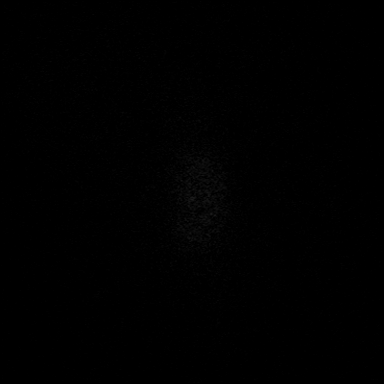

[Series 7: FLAIR · axial · 3.0mm · 0.57mm/px · z∈[-76,+95]mm · 4 of 58 slices shown (2 of 2)]
[im 1/58]
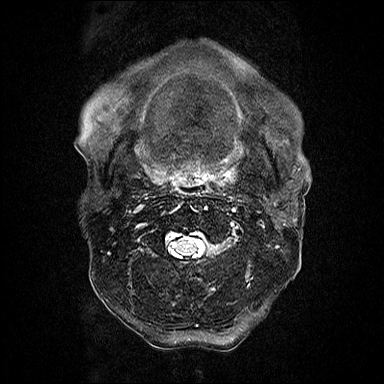
[im 20/58]
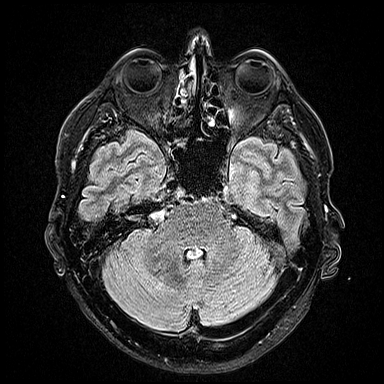
[im 39/58]
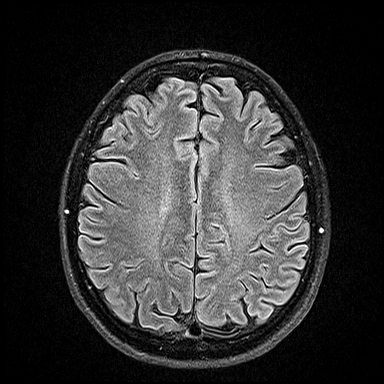
[im 58/58]
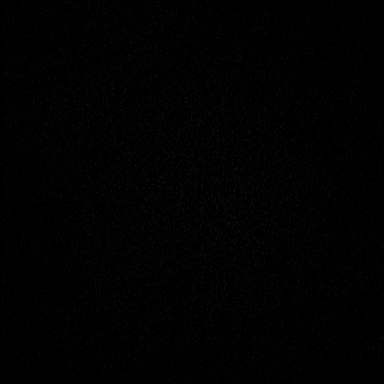

[Series 8: T1 · axial · 1.0mm · 0.75mm/px · z∈[-70,+89]mm · 10 of 160 slices shown]
[im 1/160]
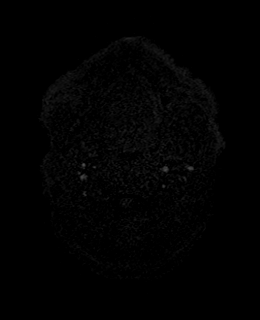
[im 18/160]
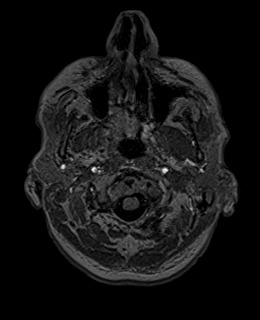
[im 36/160]
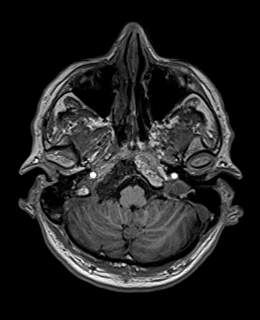
[im 54/160]
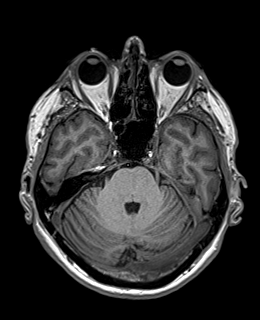
[im 71/160]
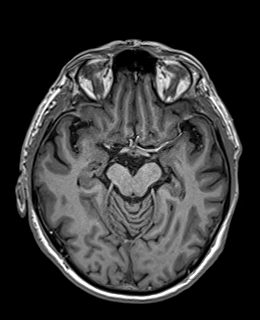
[im 89/160]
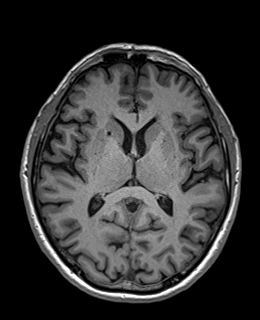
[im 107/160]
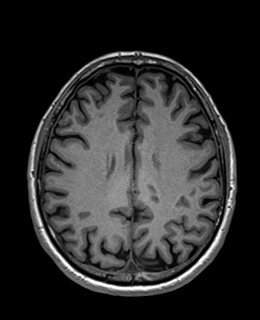
[im 124/160]
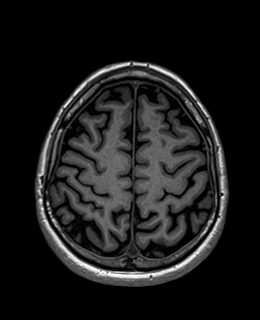
[im 142/160]
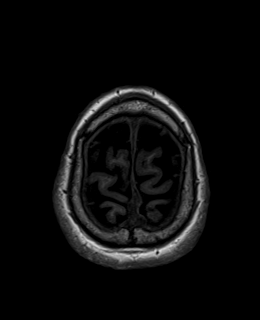
[im 160/160]
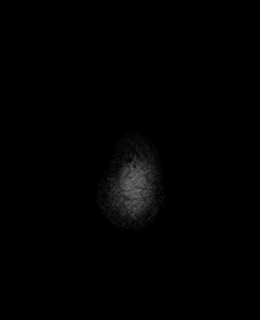

[Series 9: T2 post-contrast · coronal · 3.0mm · 0.57mm/px · 3 of 50 slices shown]
[im 1/50]
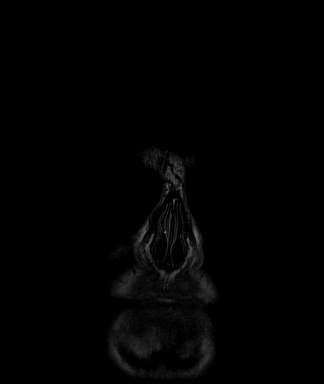
[im 25/50]
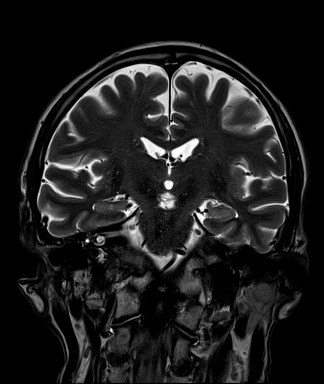
[im 50/50]
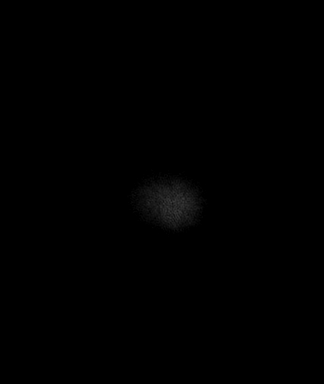

[Series 10: T1 post-contrast · axial · 1.0mm · 0.75mm/px · z∈[-70,+89]mm · 10 of 160 slices shown (1 of 2)]
[im 1/160]
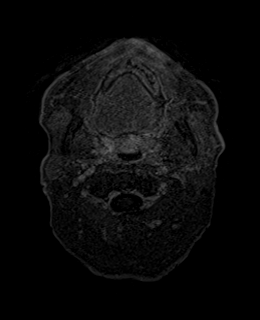
[im 18/160]
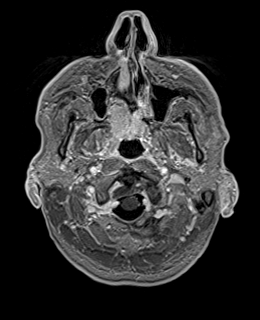
[im 36/160]
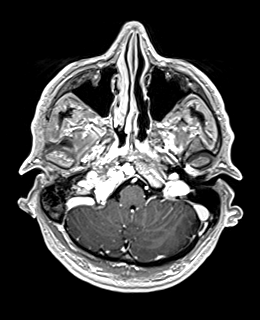
[im 54/160]
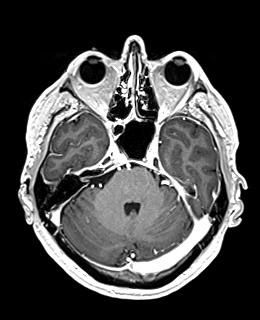
[im 71/160]
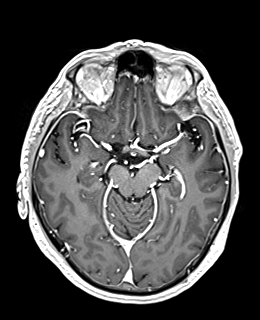
[im 89/160]
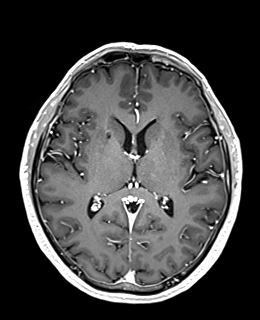
[im 107/160]
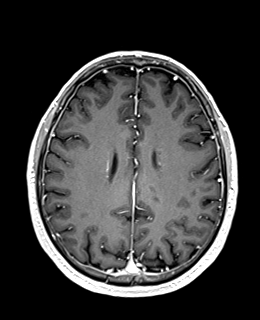
[im 124/160]
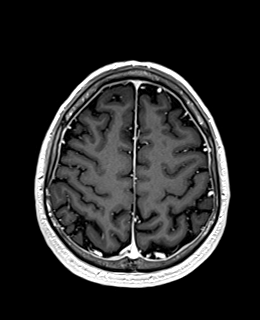
[im 142/160]
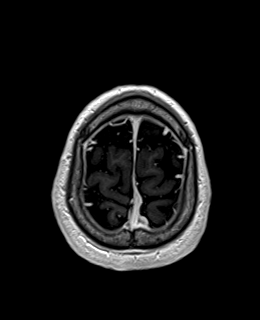
[im 160/160]
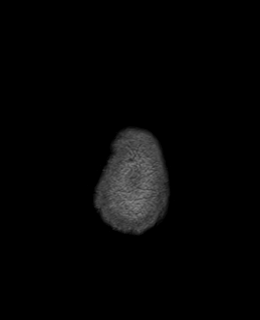

[Series 11: T1 post-contrast · coronal · 3.0mm · 0.57mm/px · 3 of 50 slices shown (2 of 2)]
[im 1/50]
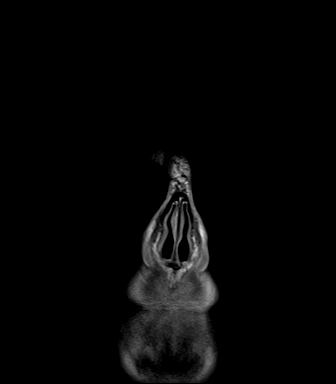
[im 25/50]
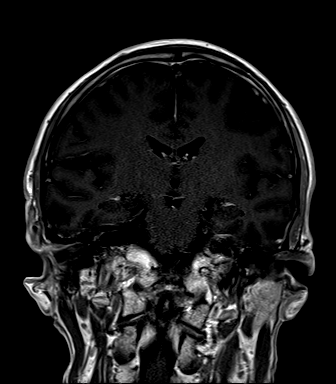
[im 50/50]
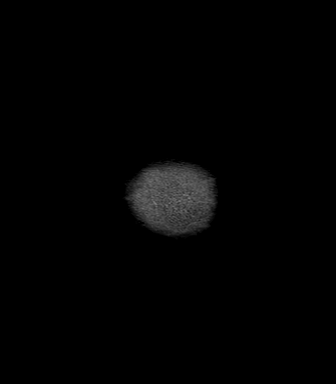

[Series 12: FLAIR post-contrast · sagittal · 3.0mm · 0.75mm/px · 3 of 42 slices shown]
[im 1/42]
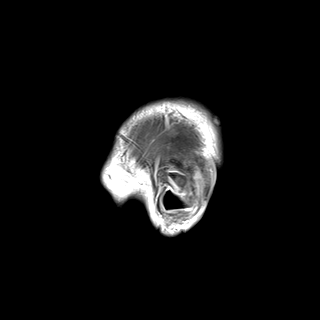
[im 21/42]
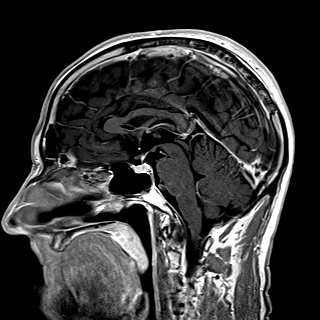
[im 42/42]
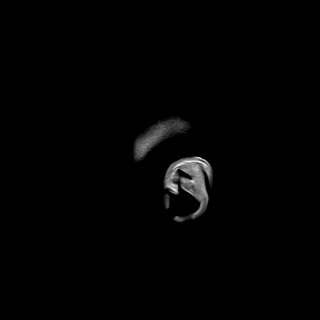

[48 of 48 positions shown; findings below may reference images not displayed]

FINDINGS: Brain: Ventricle size normal. Negative for acute or chronic
infarction. Negative for hemorrhage, mass, or edema. No enhancing
metastatic deposits in the brain

Vascular: Normal arterial flow void.

Skull and upper cervical spine: Skullbase lesion on the right is
unchanged. There is enhancing tumor in the right side of the clivus
extending into the petrous bone and down to the foramina magnum on
the right. Tumor within the right hypoglossal canal unchanged. No
extradural tumor. Tumor abuts the posterior surface of the carotid
canal on the right unchanged. No new skull lesions

Sinuses/Orbits: Mucosal edema paranasal sinuses with improvement.
Improvement in right mastoid effusion

Other: None
IMPRESSION: Stable skullbase tumor on the right.

Negative for metastatic disease to the brain

Improvement in mucosal edema in the paranasal sinuses and right
mastoid sinus effusion.

## 2020-10-21 ENCOUNTER — Other Ambulatory Visit: Payer: Self-pay | Admitting: Radiation Therapy

## 2020-10-21 DIAGNOSIS — C7931 Secondary malignant neoplasm of brain: Secondary | ICD-10-CM

## 2020-10-24 ENCOUNTER — Telehealth: Payer: Self-pay

## 2020-10-24 NOTE — Telephone Encounter (Signed)
Left patient a voicemail message in regards to telephone appointment with Freeman Caldron PA on 10/28/20 @ 3:30pm. Called to review meaningful use questions. TM

## 2020-10-26 ENCOUNTER — Telehealth: Payer: Self-pay

## 2020-10-26 NOTE — Telephone Encounter (Signed)
Called patient in regards to telephone visit with Freeman Caldron PA on 10/28/20 @ 3:30pm. Could not get through after 2 attempts. Called to review meaningful use questions. TM

## 2020-10-27 ENCOUNTER — Ambulatory Visit
Admission: RE | Admit: 2020-10-27 | Discharge: 2020-10-27 | Disposition: A | Discharge status: Home / Self Care | Payer: Medicare Other | Source: Ambulatory Visit | Attending: Radiation Oncology | Admitting: Radiation Oncology

## 2020-10-27 ENCOUNTER — Ambulatory Visit (INDEPENDENT_AMBULATORY_CARE_PROVIDER_SITE_OTHER): Payer: Medicare Other | Admitting: Orthopedic Surgery

## 2020-10-27 ENCOUNTER — Ambulatory Visit: Payer: Medicare Other

## 2020-10-27 ENCOUNTER — Other Ambulatory Visit: Payer: Self-pay

## 2020-10-27 ENCOUNTER — Encounter: Payer: Self-pay | Admitting: Orthopedic Surgery

## 2020-10-27 VITALS — BP 130/82 | HR 83 | Ht 68.0 in | Wt 162.0 lb

## 2020-10-27 DIAGNOSIS — G8929 Other chronic pain: Secondary | ICD-10-CM

## 2020-10-27 DIAGNOSIS — C7931 Secondary malignant neoplasm of brain: Secondary | ICD-10-CM

## 2020-10-27 DIAGNOSIS — M25512 Pain in left shoulder: Secondary | ICD-10-CM

## 2020-10-27 MED ORDER — GADOBENATE DIMEGLUMINE 529 MG/ML IV SOLN
15.0000 mL | Freq: Once | INTRAVENOUS | Status: AC | PRN
  Administered 2020-10-27: 15 mL via INTRAVENOUS

## 2020-10-27 NOTE — Progress Notes (Signed)
NEW PROBLEM//OFFICE VISIT  Summary assessment and plan: 65 year old male probably had bursitis, he is improving.  He knows his home exercises he does not want an injection he will call us back if he gets worse  Chief Complaint  Patient presents with  . Shoulder Pain    Left shoulder pain, Patient reports that it has bothered him for 10-12 years      65 year old male left shoulder pain he says he has had it for years but about a month or so ago he had acute pain in the left shoulder with inability to move his arm he lost range of motion but ceases over the last few weeks has gotten better he can now move his arm over his head with mild discomfort  He has a history of renal cancer with mets to the skull     Review of Systems  All other systems reviewed and are negative.    Past Medical History:  Diagnosis Date  . Renal cell carcinoma ([place]) 10/23/2015  . Skull lesion 10/20/2015   metastatic renal cell carcinoma    Past Surgical History:  Procedure Laterality Date  . robotic radical right nephrectomy      Family History  Problem Relation Age of Onset  . CAD Mother   . CAD Father   . Cancer Paternal Uncle        stomach cancer  . Cancer Paternal Uncle        unknown   Social History   Tobacco Use  . Smoking status: Current Every Day Smoker    Packs/day: 1.00    Years: 45.00    Pack years: 45.00    Types: Cigarettes  . Smokeless tobacco: Never Used  Substance Use Topics  . Alcohol use: Yes    Alcohol/week: 20.0 standard drinks    Types: 20 Shots of liquor per week    Comment: 1-5 shot of liquor per night  . Drug use: Yes    Frequency: 1.0 times per week    Types: Marijuana    Comment: occasionally    No Known Allergies  Current Meds  Medication Sig  . ibuprofen (ADVIL,MOTRIN) 200 MG tablet Take 200 mg by mouth every 4 (four) hours as needed.    BP 130/82   Pulse 83   Ht 5\' 8"  (1.727 m)   Wt 162 lb (73.5 kg)   BMI 24.63 kg/m   Physical  Exam Constitutional:      General: He is not in acute distress.    Appearance: He is well-developed.     Comments: Well developed, well nourished Normal grooming and hygiene     Cardiovascular:     Comments: No peripheral edema Musculoskeletal:     Comments: Left shoulder he has tenderness over the spine of the scapula and also over the acromion he has active range of motion forward elevation 130 degrees passive 150 degrees is negative impingement sign no weakness in the empty can position but pain  Skin:    General: Skin is warm and dry.  Neurological:     Mental Status: He is alert and oriented to person, place, and time.     Sensory: No sensory deficit.     Coordination: Coordination normal.     Gait: Gait normal.     Deep Tendon Reflexes: Reflexes are normal and symmetric.  Psychiatric:        Mood and Affect: Mood normal.        Behavior: Behavior normal.  Thought Content: Thought content normal.        Judgment: Judgment normal.     Comments: Affect normal          MEDICAL DECISION MAKING  A.  Encounter Diagnosis  Name Primary?  . Chronic left shoulder pain Yes    B. DATA ANALYSED:   IMAGING: Interpretation of images: Internal images show no fracture dislocation or degenerative changes in the shoulder or chest wall  Orders: None  Outside records reviewed: None available   C. MANAGEMENT   Home exercises  No orders of the defined types were placed in this encounter.     Arther Abbott, MD  10/27/2020 2:28 PM

## 2020-10-28 ENCOUNTER — Ambulatory Visit
Admission: RE | Admit: 2020-10-28 | Discharge: 2020-10-28 | Disposition: A | Discharge status: Home / Self Care | Payer: Medicare Other | Source: Ambulatory Visit | Attending: Urology | Admitting: Urology

## 2020-10-28 ENCOUNTER — Other Ambulatory Visit: Payer: Self-pay

## 2020-10-28 ENCOUNTER — Encounter: Payer: Self-pay | Admitting: Urology

## 2020-10-28 DIAGNOSIS — Z8589 Personal history of malignant neoplasm of other organs and systems: Secondary | ICD-10-CM | POA: Insufficient documentation

## 2020-10-28 DIAGNOSIS — C7931 Secondary malignant neoplasm of brain: Secondary | ICD-10-CM

## 2020-10-28 NOTE — Progress Notes (Signed)
Radiation Oncology         [phone]) [phone] ________________________________  Name: Gregory [name]. MRN: [id number]  Date: 10/28/2020  DOB: 27-Oct-1955  Follow-Up Visit Note  CC: Patient, No Pcp Per  No ref. 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    ICD-10-CM   1. Metastasis to brain [facility])  C79.31   2. History of malignant neoplasm metastatic to brain  Z85.89     Interval Since Last Radiation: 5 years   11/14/2015-11/23/2015 to the right skull base target treated using 4 Rapid Arc VMAT Beams in 5 fractionsof 5 Gy each, for a total prescription dose of 25 Gy.  Narrative:  This is a pleasant 65 y.o. gentleman with a history if metastatic renal cell carcinoma who had radiotherapy with SRS to the brain in March of 2017. He underwent right nephrectomy in May of 2017 which revealed a 6 cm renal cell carcinoma. He transferred his care to Dr. Alen Blew in 2018, and had been followed without systemic therapy in observation only. It appears that his last follow up visit with Dr. Alen Blew was in 05/2017 and he has since been lost to follow up. His last previous MRI brain scan was performed on 10/10/17 and revealed stability of his treated skull based metastasis. His last systemic imaging in 10/2017 was without findings for abdominal/pelvic lymphadenopathy or metastatic disease. There was a new 4.5 mm nodular density in the right upper lobe and attention on future scans was suggested. There was a stable perifissural nodule in the right lower lobe, likely benign lymph node.  His daughter called the office recently reporting that her dad had been squinting more and she was concerned because he had not had a recent MRI brain scan.  It has been well documented in his record that we have made multiple attempts to contact the patient to schedule follow-up MRI scans without success.  The patient reports that he did not want to continue with the routine scans and  therefore did not return calls for scheduling.  However, at his daughter's request, he did agree to proceed with MRI brain scan performed on 10/27/2020 and the scan did not show any new or progressive metastatic disease.  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  Since this finding was new as compared to his prior scan from 2019, the recommendation was to monitor this on a follow-up scan in 3 to 4 months.  On review of systems, the patient reports that he is doing well overall. He still has some weakness of his right eye but reports this is stable. He denies any headaches or visual changes.  He has noticed a knot on the left side of his forehead that he thinks has been present for 1 to 3 months.  This is not painful and does not appear to be enlarging to his knowledge.  He denies any chest pain, shortness of breath, cough, fevers, chills, night sweats, unintended weight changes. He denies any bowel or bladder disturbances, and denies abdominal pain, nausea or vomiting. He denies any new musculoskeletal or joint aches or pains, new skin lesions or concerns. A complete review of systems is obtained and is otherwise negative.  Past Medical History:  Past Medical History:  Diagnosis Date  . Renal cell carcinoma ([place]) 10/23/2015  . Skull lesion 10/20/2015   metastatic renal cell carcinoma  Past Surgical History: Past Surgical History:  Procedure Laterality Date  . robotic radical right nephrectomy      Social History:  Social History   Socioeconomic History  . Marital status: Single    Spouse name: Not on file  . Number of children: Not on file  . Years of education: Not on file  . Highest education level: Not on file  Occupational History  . Occupation: Programmer, systems: OTHER    Comment: [employer]  Tobacco Use  . Smoking status: Current Every Day Smoker    Packs/day: 1.00    Years: 45.00    Pack years:  45.00    Types: Cigarettes  . Smokeless tobacco: Never Used  Substance and Sexual Activity  . Alcohol use: Yes    Alcohol/week: 20.0 standard drinks    Types: 20 Shots of liquor per week    Comment: 1-5 shot of liquor per night  . Drug use: Yes    Frequency: 1.0 times per week    Types: Marijuana    Comment: occasionally  . Sexual activity: Yes  Other Topics Concern  . Not on file  Social History Narrative  . Not on file   Social Determinants of Health   Financial Resource Strain: Not on file  Food Insecurity: Not on file  Transportation Needs: Not on file  Physical Activity: Not on file  Stress: Not on file  Social Connections: Not on file  Intimate Partner Violence: Not on file  The patient is single. He lives in [place]. He continues to smoke despite counseling.  Family History: Family History  Problem Relation Age of Onset  . CAD Mother   . CAD Father   . Cancer Paternal Uncle        stomach cancer  . Cancer Paternal Uncle        unknown    ALLERGIES:  has No Known Allergies.  Meds: Current Outpatient Medications  Medication Sig Dispense Refill  . ibuprofen (ADVIL,MOTRIN) 200 MG tablet Take 200 mg by mouth every 4 (four) hours as needed.     No current facility-administered medications for this encounter.    Physical Findings:  vitals were not taken for this visit.   Unable to assess due to telephone follow-up visit format.   Lab Findings: Lab Results  Component Value Date   WBC 9.1 05/15/2017   WBC 10.4 11/01/2015   HGB 17.5 (H) 05/15/2017   HCT 51.9 (H) 05/15/2017   PLT 199 05/15/2017    Lab Results  Component Value Date   NA 136 05/15/2017   K 4.4 05/15/2017   CHLORIDE 103 05/15/2017   CO2 24 05/15/2017   GLUCOSE 91 05/15/2017   BUN 13.4 05/15/2017   CREATININE 1.2 05/15/2017   BILITOT 0.55 05/15/2017   ALKPHOS 95 05/15/2017   AST 12 05/15/2017   ALT 12 05/15/2017   PROT 7.0 05/15/2017   ALBUMIN 3.5 05/15/2017   CALCIUM 9.2  05/15/2017   ANIONGAP 8 05/15/2017   ANIONGAP 7 10/24/2015    Radiographic Findings: MR Brain W Wo Contrast  Result Date: 10/27/2020 CLINICAL DATA:  Surveillance of metastatic renal cell carcinoma. EXAM: MRI HEAD WITHOUT AND WITH CONTRAST TECHNIQUE: Multiplanar, multiecho pulse sequences of the brain and surrounding structures were obtained without and with intravenous contrast. CONTRAST:  45mL MULTIHANCE GADOBENATE DIMEGLUMINE 529 MG/ML IV SOLN COMPARISON:  MRI head 10/10/2017 FINDINGS: Brain: Ventricle size normal. Negative for infarct, hemorrhage, mass. Negative for metastatic disease to the brain. Vascular:  Normal arterial flow voids. Skull and upper cervical spine: Enhancing mass in the right central and posterior skull base appear stable from the prior study. No new skull lesion identified. Sinuses/Orbits: Mild mucosal edema paranasal sinuses. Negative orbit. Other: Left frontal parietal convexity scalp lesion is new. This measures approximately 14 mm and is predominately low signal on T2 suggesting it may be calcified. Likely Pilar cyst. Given it's interval development since 2019, follow-up recommended to exclude an enlarging lesion. IMPRESSION: Large skull base lesion on the right stable.  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 Clinical follow-up recommended. Electronically Signed   By: Franchot Gallo M.D.   On: 10/27/2020 15:09   DG Shoulder Left  Result Date: 10/27/2020 Left shoulder pain Left shoulder x-rays 3 views Abnormality seen in the acromion seem to be bone infarcts Patient does have a history of renal cell cancer had some radiation therapy which may account for these changes The shoulder joint shows minimal if any degenerative joint changes The only finding is probable bone infarcts in the acromion   Impression/Plan: 1. Stage Metastatic Renal Cell Carcinoma to the skull base. The patient  continues to do well clinically as well as radiographically based on MRI.  There is a new scalp lesion at the left frontoparietal region, felt most likely to represent a benign Pilar cyst but the recommendation is for a repeat scan in 3 to 4 months for confirmation since this is new as compared to his prior MRI from 2019.  We will plan to see him in 3-4 months time for follow up after his next surveillance MRI, but sooner if needed. He is counseled on the location of his treated tumor and given precautions regarding stroke risks.  2. Tobacco Usage. The patient is not interested in making changes currently, but continues to receive counseling regarding tobacco use.  I spoke with the patient to conduct his routine scheduled 6 month follow up visit via telephone to spare the patient unnecessary potential exposure in the healthcare setting during the current COVID-19 pandemic.  The patient was notified in advance and gave permission to proceed with this visit format.   Nicholos Johns, MMS, PA-C [place] [facility] [place]: [phone]  Fax: [phone]

## 2020-10-31 ENCOUNTER — Telehealth: Payer: Self-pay | Admitting: Radiation Oncology

## 2020-12-02 ENCOUNTER — Other Ambulatory Visit: Payer: Self-pay | Admitting: Radiation Therapy

## 2020-12-02 DIAGNOSIS — C7931 Secondary malignant neoplasm of brain: Secondary | ICD-10-CM

## 2020-12-02 DIAGNOSIS — C7949 Secondary malignant neoplasm of other parts of nervous system: Secondary | ICD-10-CM

## 2021-02-09 NOTE — Progress Notes (Signed)
Patient is  aware that this is a phone visit. Denies any issues with fatigue or n/v. Meaningful use is complete.

## 2021-02-10 ENCOUNTER — Other Ambulatory Visit: Payer: Medicare Other

## 2021-02-14 ENCOUNTER — Telehealth: Payer: Self-pay | Admitting: Radiation Therapy

## 2021-02-14 NOTE — Telephone Encounter (Signed)
Called to let the patient's daughter know that he did not complete the schedule brain MRI on 6/10, so we are cancelling the follow-up with [name] on 6/15. She has the phone number to [facility] and plans to speak with her father about rescheduling the missed scan.   Mont Dutton R.T.(R)(T)

## 2021-02-15 ENCOUNTER — Ambulatory Visit
Admission: RE | Admit: 2021-02-15 | Discharge: 2021-02-15 | Disposition: A | Discharge status: Home / Self Care | Payer: Medicare Other | Source: Ambulatory Visit | Attending: Urology | Admitting: Urology

## 2021-12-11 ENCOUNTER — Telehealth: Payer: Self-pay | Admitting: Radiation Oncology

## 2021-12-11 NOTE — Telephone Encounter (Signed)
Unable to LVM for pt's daughter to schedule RECON ?

## 2021-12-13 ENCOUNTER — Other Ambulatory Visit: Payer: Self-pay | Admitting: Internal Medicine

## 2021-12-13 DIAGNOSIS — R222 Localized swelling, mass and lump, trunk: Secondary | ICD-10-CM

## 2021-12-23 ENCOUNTER — Ambulatory Visit
Admission: RE | Admit: 2021-12-23 | Discharge: 2021-12-23 | Disposition: A | Discharge status: Home / Self Care | Payer: Medicare Other | Source: Ambulatory Visit | Attending: Internal Medicine | Admitting: Internal Medicine

## 2021-12-23 DIAGNOSIS — R222 Localized swelling, mass and lump, trunk: Secondary | ICD-10-CM

## 2021-12-28 ENCOUNTER — Encounter: Payer: Self-pay | Admitting: Urology

## 2021-12-28 NOTE — Progress Notes (Signed)
Reconsult telephone appointment. I verified patient identity and began nursing interview. Patient reports occasional LT arm pain 9/10. No other issues reported at this time. ? ?Meaningful use complete. ? ?Reminded patient of his 9:00am-12/29/21 telephone appointment w/ [name] Bruning PA-C. I left my extension [phone] in case patient needs anything. Patient verbalized understanding of information given. ? ?Patient contact [phone] ?

## 2021-12-29 ENCOUNTER — Ambulatory Visit
Admission: RE | Admit: 2021-12-29 | Discharge: 2021-12-29 | Disposition: A | Discharge status: Home / Self Care | Payer: Medicare Other | Source: Ambulatory Visit | Attending: Urology | Admitting: Urology

## 2021-12-29 DIAGNOSIS — C649 Malignant neoplasm of unspecified kidney, except renal pelvis: Secondary | ICD-10-CM

## 2021-12-29 DIAGNOSIS — M899 Disorder of bone, unspecified: Secondary | ICD-10-CM

## 2021-12-29 NOTE — Progress Notes (Signed)
?Radiation Oncology         [phone] ?________________________________ ? ?Outpatient Re-Consultation - Conducted via telephone due to current COVID-19 concerns for limiting patient exposure ? ?Name: Gregory [name]. MRN: [id number]  ?Date of Service: 12/29/2021 DOB: 15-Sep-1955 ? ?XT:[id number], No Pcp Per (Inactive)  Byrd Hesselbach, MD  ? ?REFERRING PHYSICIAN: Byrd Hesselbach, MD ? ?DIAGNOSIS: 66 y/o male with a new, painful bony metastasis in the left shoulder, suspected secondary to his known metastatic renal cell carcinoma. ? ?  ICD-10-CM   ?1. Skull lesion  M89.9   ?  ?2. 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 C64.9   ?  ? ? ?HISTORY OF PRESENT ILLNESS: Gregory [name]. is a 66 y.o. male seen at the request of Dr. Chauncey Reading. He is well known to our service, having previously been treated with fractionated SRS to a metastatic lesion in the right skull base in 2017.  ?In summary, he presented to ophthalmology for a right eye injury, and a brain MRI for work-up on 10/21/15 revealed a 5 cm right central and posterior skull-based mass. Further work up with CT C/A/P from the same day showed an enhancing right renal mass. Biopsy of the renal mass on 11/01/15 revealed clear-cell renal cell carcinoma and was treated with a right, robotic-assisted laparoscopic radical nephrectomy on 01/10/17, under the care of Dr. Brendia Sacks at [facility]. He was previously seen by Dr. Whitney Muse and Dr. Alen Blew and was under observation, but he did not follow up after 2018. He has been non-compliant with follow up MRI brain scans due to the fact that he is very claustrophobic and tolerates the scans very poorly. He developed some trouble with his vision/squinting and did undergo brain MRI on 10/27/20 for further evaluation which showed a stable appearance of the previously treated skull base lesion, and no new skull lesions or brain metastasis.  There was a new scalp lesion at the left  frontoparietal region felt most likely to be a Pilar cyst so the recommendation was for a repeat scan in 3 to 4 months but he did not follow-up and refused further scans. ? ?More recently, he has developed progressive left shoulder pain over the past month so he presented to his orthopedist, Dr. Chauncey Reading, for evaluation on 12/05/2021.  Plain film x-rays of the left shoulder showed a suspicious mass in the supraclavicular region which was further evaluated with a shoulder MRI on 12/23/21.  The MRI showed a large, (11 x 9 x 8.5 cm) heterogeneous mass lesion with soft tissue extension emanating from the left scapula, as well as multiple rib and left humeral lesions, worrisome for metastatic disease.  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  Therefore, he has been referred to Dr. Nydia Bouton, an oncologic orthopedic surgeon at [facility] with consultation scheduled for 01/11/2022 to discuss biopsy and/or surgical options. ? ?He has self-referred back to [place] today for a second opinion regarding treatment recommendations. ? ?PREVIOUS RADIATION THERAPY: Yes  ?11/14/15 - 11/23/15: the right skull base target treated to 25 Gy in 5 fractions of 5 Gy each. ? ?PAST MEDICAL HISTORY:  ?Past Medical History:  ?Diagnosis Date  ? Renal cell carcinoma ([place]) 10/23/2015  ? Skull lesion 10/20/2015  ? metastatic renal cell carcinoma  ?   ? ?PAST SURGICAL HISTORY: ?Past Surgical History:  ?Procedure Laterality Date  ? robotic radical right nephrectomy    ? ? ?  FAMILY HISTORY:  ?Family History  ?Problem Relation Age of Onset  ? CAD Mother   ? CAD Father   ? Cancer Paternal Uncle   ?     stomach cancer  ? Cancer Paternal Uncle   ?     unknown  ? ? ?SOCIAL HISTORY:  ?Social History  ? ?Socioeconomic History  ? Marital status: Single  ?  Spouse name: Not on file  ? Number of children: Not on file  ? Years of education: Not on file  ? Highest education level: Not on file  ?Occupational  History  ? Occupation: Clinical biochemist  ?  Employer: OTHER  ?  Comment: [employer]  ?Tobacco Use  ? Smoking status: Every Day  ?  Packs/day: 1.00  ?  Years: 45.00  ?  Pack years: 45.00  ?  Types: Cigarettes  ? Smokeless tobacco: Never  ?Substance and Sexual Activity  ? Alcohol use: Yes  ?  Alcohol/week: 20.0 standard drinks  ?  Types: 20 Shots of liquor per week  ?  Comment: 1-5 shot of liquor per night  ? Drug use: Yes  ?  Frequency: 1.0 times per week  ?  Types: Marijuana  ?  Comment: occasionally  ? Sexual activity: Yes  ?Other Topics Concern  ? Not on file  ?Social History Narrative  ? Not on file  ? ?Social Determinants of Health  ? ?Financial Resource Strain: Not on file  ?Food Insecurity: Not on file  ?Transportation Needs: Not on file  ?Physical Activity: Not on file  ?Stress: Not on file  ?Social Connections: Not on file  ?Intimate Partner Violence: Not on file  ? ? ?ALLERGIES: Patient has no known allergies. ? ?MEDICATIONS:  ?Current Outpatient Medications  ?Medication Sig Dispense Refill  ? Ascorbic Acid (VITAMIN C) 100 MG tablet Take 100 mg by mouth daily.    ? cholecalciferol (VITAMIN D3) 25 MCG (1000 UNIT) tablet Take 1,000 Units by mouth daily.    ? HYDROcodone-acetaminophen (NORCO) 10-325 MG tablet Take 1 tablet by mouth every 6 (six) hours as needed. Patient taking half a tab PRN    ? vitamin B-12 (CYANOCOBALAMIN) 100 MCG tablet Take 100 mcg by mouth daily.    ? acetaminophen (TYLENOL) 325 MG tablet Take 650 mg by mouth every 6 (six) hours as needed.    ? ibuprofen (ADVIL,MOTRIN) 200 MG tablet Take 200 mg by mouth every 4 (four) hours as needed. (Patient not taking: Reported on 02/09/2021)    ? ?No current facility-administered medications for this encounter.  ? ? ?REVIEW OF SYSTEMS:  On review of systems, the patient reports that he is doing well overall. He denies any chest pain, shortness of breath, cough, fevers, chills, night sweats, or unintended weight changes. He denies any bowel or bladder  disturbances, and denies abdominal pain, nausea or vomiting.  He has not had any headaches, blurry vision, changes in auditory or visual acuity, dizziness/imbalance or generalized weakness.  He reports intermittent severe left arm/shoulder pain, rated 9/10 but overall, feels that his pain is well managed with his pain medication currently.  His pain is worse when sitting upright or any activity and is somewhat improved with lying flat on his back.  He denies any paresthesias or focal weakness in the upper extremities.  A complete review of systems is obtained and is otherwise negative. ? ?  ?PHYSICAL EXAM:  ?Wt Readings from Last 3 Encounters:  ?10/27/20 162 lb (73.5 kg)  ?10/14/17 153 lb 8 oz (  69.6 kg)  ?05/08/17 146 lb 4.8 oz (66.4 kg)  ? ?Temp Readings from Last 3 Encounters:  ?10/14/17 98.5 ?F (36.9 ?C) (Oral)  ?05/08/17 99.2 ?F (37.3 ?C) (Oral)  ?04/08/17 98.5 ?F (36.9 ?C) (Oral)  ? ?BP Readings from Last 3 Encounters:  ?10/27/20 130/82  ?10/14/17 137/87  ?05/08/17 110/69  ? ?Pulse Readings from Last 3 Encounters:  ?10/27/20 83  ?10/14/17 80  ?05/08/17 80  ? ?Pain Assessment ?Pain Score: 9  (LT arm)/10 ?Unable to assess due to telephone re-consult visit format. ? ? ?KPS = 90 ? ?100 - Normal; no complaints; no evidence of disease. ?90   - Able to carry on normal activity; minor signs or symptoms of disease. ?80   - Normal activity with effort; some signs or symptoms of disease. ?50   - Cares for self; unable to carry on normal activity or to do active work. ?60   - Requires occasional assistance, but is able to care for most of his personal needs. ?50   - Requires considerable assistance and frequent medical care. ?73   - Disabled; requires special care and assistance. ?30   - Severely disabled; hospital admission is indicated although death not imminent. ?20   - Very sick; hospital admission necessary; active supportive treatment necessary. ?10   - Moribund; fatal processes progressing rapidly. ?0     -  Dead ? ?Karnofsky DA, Abelmann WH, Craver LS and Burchenal [facility] [phone]) The use of the nitrogen mustards in the palliative treatment of carcinoma: with particular reference to bronchogenic carcinoma Cancer 1 [phone] ? ?LABORATOR

## 2022-01-03 ENCOUNTER — Encounter: Payer: Self-pay | Admitting: *Deleted

## 2022-01-03 ENCOUNTER — Encounter (HOSPITAL_COMMUNITY): Payer: Self-pay | Admitting: Hematology

## 2022-01-03 ENCOUNTER — Inpatient Hospital Stay (HOSPITAL_COMMUNITY): Payer: Medicare Other | Attending: Hematology | Admitting: Hematology

## 2022-01-03 VITALS — BP 142/89 | HR 107 | Temp 98.2°F | Resp 16 | Ht 67.0 in | Wt 139.7 lb

## 2022-01-03 DIAGNOSIS — C649 Malignant neoplasm of unspecified kidney, except renal pelvis: Secondary | ICD-10-CM | POA: Diagnosis present

## 2022-01-03 DIAGNOSIS — F17210 Nicotine dependence, cigarettes, uncomplicated: Secondary | ICD-10-CM | POA: Insufficient documentation

## 2022-01-03 DIAGNOSIS — Z79899 Other long term (current) drug therapy: Secondary | ICD-10-CM

## 2022-01-03 DIAGNOSIS — R634 Abnormal weight loss: Secondary | ICD-10-CM | POA: Diagnosis not present

## 2022-01-03 DIAGNOSIS — Z803 Family history of malignant neoplasm of breast: Secondary | ICD-10-CM

## 2022-01-03 DIAGNOSIS — R630 Anorexia: Secondary | ICD-10-CM

## 2022-01-03 DIAGNOSIS — K5900 Constipation, unspecified: Secondary | ICD-10-CM | POA: Diagnosis not present

## 2022-01-03 DIAGNOSIS — C7951 Secondary malignant neoplasm of bone: Secondary | ICD-10-CM | POA: Diagnosis not present

## 2022-01-03 DIAGNOSIS — Z923 Personal history of irradiation: Secondary | ICD-10-CM | POA: Insufficient documentation

## 2022-01-03 DIAGNOSIS — M25512 Pain in left shoulder: Secondary | ICD-10-CM | POA: Diagnosis not present

## 2022-01-03 DIAGNOSIS — Z905 Acquired absence of kidney: Secondary | ICD-10-CM

## 2022-01-03 NOTE — Progress Notes (Unsigned)
Gregory Cleveland, MD  [name] ?Ok  ? ?[name]  ?May need [name]  or [name]  ?[name]   ?  ?   ? ?

## 2022-01-03 NOTE — Patient Instructions (Addendum)
[facility] at [facility] ?Discharge Instructions ? ?You were seen and examined today by Dr. Delton Coombes. Dr. Delton Coombes is a medical oncologist, meaning that he specializes in the treatment of cancer diagnoses. Dr. Delton Coombes discussed your past medical history, family history of cancers, and the events that led to you being here today. ? ?You were referred to Dr. Delton Coombes by the Radiation Oncologist due to your diagnosis of renal cell carcinoma. Your recent MRI of the shoulder revealed a bone mass. Renal cell carcinoma can metastasize to the bone, Dr. Delton Coombes has recommended a biopsy of this area to confirm. ? ?Dr. Delton Coombes has recommended a PET scan and a brain MRI to complete the staging process. A PET scan is a specialized CT scan of your whole body that illuminates where there is cancer present within the body. This will show if there is concerns for cancer anywhere in the body.  ? ?Take 1/2 tablet of the pain pill every 4 hours as needed. Also add miralax daily to prevent constipation. ? ?Follow-up with Dr. Delton Coombes following the biopsy and scans. ? ? ?Thank you for choosing [facility] at [facility] to provide your oncology and hematology care.  To afford each patient quality time with our provider, please arrive at least 15 minutes before your scheduled appointment time.  ? ?If you have a lab appointment with the [facility] please come in thru the Main Entrance and check in at the main information desk. ? ?You need to re-schedule your appointment should you arrive 10 or more minutes late.  We strive to give you quality time with our providers, and arriving late affects you and other patients whose appointments are after yours.  Also, if you no show three or more times for appointments you may be dismissed from the clinic at the providers discretion.     ?Again, thank you for choosing [facility].  Our hope is that these requests will  decrease the amount of time that you wait before being seen by our physicians.       ?_____________________________________________________________ ? ?Should you have questions after your visit to [facility], please contact our office at [phone] and follow the prompts.  Our office hours are 8:00 a.m. and 4:30 p.m. Monday - Friday.  Please note that voicemails left after 4:00 p.m. may not be returned until the following business day.  We are closed weekends and major holidays.  You do have access to a nurse 24-7, just call the main number to the clinic [phone] and do not press any options, hold on the line and a nurse will answer the phone.   ? ?For prescription refill requests, have your pharmacy contact our office and allow 72 hours.   ? ?Due to Covid, you will need to wear a mask upon entering the hospital. If you do not have a mask, a mask will be given to you at the Main Entrance upon arrival. For doctor visits, patients may have 1 support person age 16 or older with them. For treatment visits, patients can not have anyone with them due to social distancing guidelines and our immunocompromised population.  ? ? ? ?

## 2022-01-03 NOTE — Progress Notes (Signed)
? ?[facility] ?[address] ?[place],  [address] ? ? ?CLINIC:  ?Medical Oncology/Hematology ? ?CONSULT NOTE ? ?Patient Care Team: ?Patient, No Pcp Per (Inactive) as PCP - General (General Practice) ?Derek Jack, MD as Medical Oncologist (Medical Oncology) ?Brien Mates, RN as Oncology Nurse Navigator (Medical Oncology) ? 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 ?HISTORY OF PRESENTING ILLNESS:  ?[name] 66 y.o. 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 Tammi [name]. ? ?Today he reports feeling well, and he is accompanied by his girlfriend. He has a history of kidney cancer diagnosed in 2017 which was treated with radiation. He reports pain in his left shoulder and scapula which has been present for 2-3 years. He tripped and fell in December 2022, and the left shoulder pain has been worsening since. He reports he has lost 20 lbs in the past 3 months. His appetite has been poor due to the left shoulder pain. He denies vision changes, blurry vision, double vision, nausea, and vomiting. The pain in his left shoulder makes it difficulty for him to sit upright, and he reports he has to lay down in the backseat when riding in a car. He is currently taking 1/2 tablet of hydrocodone-acetaminophen 10-325 mg every 5 hours. He denies abdominal pain. He denies history of CVA, MI, RA, and lupus anticoagulant. He reports constipation for which he has been taking Metamucil which has not helped.  ? ?His home is in [place], but he is currently staying with his girlfriend in [place]. 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 He currently smokes 1/2 ppd, and he previously smoked 2 ppd since he was 84 years old. His mother had breast cancer, and he reports 2 paternal uncles and 1 paternal aunt had cancer of unknown types.   ? ?MEDICAL HISTORY:  ?Past Medical History:  ?Diagnosis Date  ? Renal cell carcinoma ([facility]) 10/23/2015  ? Skull lesion 10/20/2015  ? metastatic renal cell carcinoma  ? ? ?SURGICAL HISTORY: ?Past Surgical History:  ?Procedure Laterality Date  ? robotic radical right nephrectomy    ? ? ?SOCIAL HISTORY: ?Social History  ? ?Socioeconomic History  ? Marital status: Single  ?  Spouse name: Not on file  ? Number of children: Not on file  ? Years of education: Not on file  ? Highest education level: Not on file  ?Occupational History  ? Occupation: Clinical biochemist  ?  Employer: OTHER  ?  Comment: [employer]  ?Tobacco Use  ? Smoking status: Every Day  ?  Packs/day: 0.50  ?  Years: 45.00  ?  Pack years: 22.50  ?  Types: Cigarettes  ? Smokeless tobacco: Never  ?Vaping Use  ? Vaping Use: Never used  ?Substance and Sexual Activity  ? Alcohol use: Not Currently  ?  Alcohol/week: 20.0 standard drinks  ?  Types: 20 Shots of liquor per week  ?  Comment: 1-5 shot of liquor per night  ? Drug use: Yes  ?  Frequency: 1.0 times per week  ?  Comment: occasionally  ? Sexual activity: Yes  ?Other Topics Concern  ? Not on file  ?Social History Narrative  ? Not on file  ? ?Social Determinants of Health  ? ?Financial Resource Strain: Not on file  ?Food Insecurity: Not on file  ?Transportation Needs: Not on file  ?Physical  Activity: Not on file  ?Stress: Not on file  ?Social Connections: Not on file  ?Intimate Partner Violence: Not on file  ? ? ?FAMILY HISTORY: ?Family History  ?Problem Relation Age of Onset  ? CAD Mother   ? CAD Father   ? Cancer Paternal Uncle   ?     stomach cancer  ? Cancer Paternal Uncle   ?     unknown  ? ? ?ALLERGIES:  ?has No Known Allergies. ? ?MEDICATIONS:  ?Current Outpatient Medications  ?Medication Sig Dispense Refill  ? Ascorbic Acid (VITAMIN C) 100 MG tablet Take 100 mg by mouth daily.    ? BIOTIN PO Take by mouth.    ? cholecalciferol (VITAMIN D3) 25 MCG (1000 UNIT) tablet Take 1,000 Units by mouth daily.    ?  ELDERBERRY PO Take by mouth.    ? HYDROcodone-acetaminophen (NORCO) 10-325 MG tablet Take 1 tablet by mouth every 6 (six) hours as needed. Patient taking half a tab PRN    ? vitamin B-12 (CYANOCOBALAMIN) 100 MCG tablet Take 100 mcg by mouth daily.    ? ?No current facility-administered medications for this visit.  ? ? ?REVIEW OF SYSTEMS:   ?Review of Systems  ?Constitutional:  Positive for appetite change and unexpected weight change. Negative for fatigue.  ?Eyes:  Negative for eye problems.  ?Gastrointestinal:  Positive for constipation. Negative for abdominal pain, nausea and vomiting.  ?Musculoskeletal:  Positive for arthralgias (7/10 L shoulder).  ?Neurological:  Positive for headaches and numbness (L arm).  ?Psychiatric/Behavioral:  Positive for sleep disturbance. The patient is nervous/anxious.   ?All other systems reviewed and are negative. ? ? ?PHYSICAL EXAMINATION: ?ECOG PERFORMANCE STATUS: 1 - Symptomatic but completely ambulatory ? ?Vitals:  ? 01/03/22 1318  ?BP: (!) 142/89  ?Pulse: (!) 107  ?Resp: 16  ?Temp: 98.2 ?F (36.8 ?C)  ?SpO2: 100%  ? ?Filed Weights  ? 01/03/22 1318  ?Weight: 139 lb 11.2 oz (63.4 kg)  ? ?Physical Exam ?Vitals reviewed.  ?Constitutional:   ?   Appearance: Normal appearance.  ?Cardiovascular:  ?   Rate and Rhythm: Normal rate and regular rhythm.  ?   Pulses: Normal pulses.  ?   Heart sounds: Normal heart sounds.  ?Pulmonary:  ?   Effort: Pulmonary effort is normal.  ?   Breath sounds: Normal breath sounds.  ?Abdominal:  ?   Palpations: Abdomen is soft. There is no mass.  ?   Tenderness: There is no abdominal tenderness.  ?Musculoskeletal:  ?   Right lower leg: No edema.  ?   Left lower leg: No edema.  ?Lymphadenopathy:  ?   Upper Body:  ?   Right upper body: No supraclavicular or axillary adenopathy.  ?   Left upper body: No supraclavicular or axillary adenopathy.  ?   Lower Body: No right inguinal adenopathy. No left inguinal adenopathy.  ?Neurological:  ?   General: No focal  deficit present.  ?   Mental Status: He is alert and oriented to person, place, and time.  ?Psychiatric:     ?   Mood and Affect: Mood normal.     ?   Behavior: Behavior normal.  ? ? ? ?LABORATORY DATA:  ?I have reviewed the data as listed ? ?  Latest Ref Rng & Units 05/15/2017  ?  8:26 AM 11/01/2015  ?  7:00 AM 10/24/2015  ?  5:05 PM  ?CBC  ?WBC 4.0 - 10.3 10e3/uL 9.1   10.4   10.8    ?  Hemoglobin 13.0 - 17.1 g/dL 17.5   17.9   16.9    ?Hematocrit 38.4 - 49.9 % 51.9   52.3   49.9    ?Platelets 140 - 400 10e3/uL 199   227   251    ? ? ?  Latest Ref Rng & Units 05/15/2017  ?  8:26 AM 02/20/2016  ?  2:22 PM 10/24/2015  ?  5:05 PM  ?CMP  ?Glucose 70 - 140 mg/dl 91    102    ?BUN 7.0 - 26.0 mg/dL 13.4    13    ?Creatinine 0.7 - 1.3 mg/dL 1.2   1.30   0.93    ?Sodium 136 - 145 mEq/L 136    138    ?Potassium 3.5 - 5.1 mEq/L 4.4    4.0    ?Chloride 101 - 111 mmol/L   107    ?CO2 22 - 29 mEq/L 24    24    ?Calcium 8.4 - 10.4 mg/dL 9.2    8.9    ?Total Protein 6.4 - 8.3 g/dL 7.0    7.3    ?Total Bilirubin 0.20 - 1.20 mg/dL 0.55    0.2    ?Alkaline Phos 40 - 150 U/L 95    83    ?AST 5 - 34 U/L 12    15    ?ALT 0 - 55 U/L 12    16    ? ? ?RADIOGRAPHIC STUDIES: ?I have personally reviewed the radiological images as listed and agreed with the findings in the report. ?MR SHOULDER LEFT WO CONTRAST ? ?Result Date: 12/24/2021 ?CLINICAL DATA:  Severe left shoulder pain and weakness. EXAM: MRI OF THE LEFT SHOULDER WITHOUT CONTRAST TECHNIQUE: Multiplanar, multisequence MR imaging of the shoulder was performed. No intravenous contrast was administered. COMPARISON:  Plain films left shoulder 10/27/2020. FINDINGS: Rotator cuff:  Intact.  Mild appearing tendinopathy noted. Muscles:  No atrophy. Biceps long head:  Intact. Acromioclavicular Joint: Mild osteoarthritis. Type 2 acromion. No subacromial/subdeltoid bursal fluid. Glenohumeral Joint: Negative. Labrum:  Intact Bones: There is a heterogeneous mass lesion which is incompletely visualized  which is centered in scapular and measures at least 11 cm transverse by a 9 cm AP by 8.5 cm craniocaudal. Heterogeneous appearance of the lesion is most consistent with chondroid matrix. It invades the musculature of t

## 2022-01-05 ENCOUNTER — Other Ambulatory Visit (HOSPITAL_COMMUNITY): Payer: Self-pay

## 2022-01-05 MED ORDER — HYDROCODONE-ACETAMINOPHEN 10-325 MG PO TABS: 0.5000 | ORAL_TABLET | ORAL | 0 refills | Status: DC | PRN

## 2022-01-09 ENCOUNTER — Other Ambulatory Visit: Payer: Self-pay | Admitting: Radiology

## 2022-01-10 ENCOUNTER — Ambulatory Visit (HOSPITAL_COMMUNITY): Admission: RE | Admit: 2022-01-10 | Payer: Medicare Other | Source: Ambulatory Visit

## 2022-01-11 ENCOUNTER — Encounter (HOSPITAL_COMMUNITY)
Admission: RE | Admit: 2022-01-11 | Discharge: 2022-01-11 | Disposition: A | Discharge status: Home / Self Care | Payer: Medicare Other | Source: Ambulatory Visit | Attending: Hematology | Admitting: Hematology

## 2022-01-11 DIAGNOSIS — C649 Malignant neoplasm of unspecified kidney, except renal pelvis: Secondary | ICD-10-CM | POA: Insufficient documentation

## 2022-01-11 DIAGNOSIS — C7951 Secondary malignant neoplasm of bone: Secondary | ICD-10-CM | POA: Insufficient documentation

## 2022-01-12 ENCOUNTER — Other Ambulatory Visit (HOSPITAL_COMMUNITY): Payer: Self-pay

## 2022-01-12 MED ORDER — HYDROCODONE-ACETAMINOPHEN 10-325 MG PO TABS: 0.5000 | ORAL_TABLET | ORAL | 0 refills | Status: DC | PRN

## 2022-01-14 ENCOUNTER — Ambulatory Visit
Admission: RE | Admit: 2022-01-14 | Discharge: 2022-01-14 | Disposition: A | Discharge status: Home / Self Care | Payer: Medicare Other | Source: Ambulatory Visit | Attending: Hematology | Admitting: Hematology

## 2022-01-14 DIAGNOSIS — C649 Malignant neoplasm of unspecified kidney, except renal pelvis: Secondary | ICD-10-CM

## 2022-01-14 MED ORDER — GADOBENATE DIMEGLUMINE 529 MG/ML IV SOLN
12.0000 mL | Freq: Once | INTRAVENOUS | Status: AC | PRN
  Administered 2022-01-14: 12 mL via INTRAVENOUS

## 2022-01-16 ENCOUNTER — Other Ambulatory Visit: Payer: Self-pay | Admitting: Student

## 2022-01-16 NOTE — H&P (Signed)
Chief Complaint: Patient was seen in consultation today for left shoulder mass biopsy  at the request of Katragadda,Sreedhar  Referring Physician(s): Katragadda,Sreedhar  Supervising Physician: Gilmer Mor  Patient Status: [facility] - Out-pt  History of Present Illness: Gregory [name]. is a 66 y.o. male with medical history significant for metastatic renal cell carcinoma with right nephrectomy. Pt was diagnosed with renal cell carcinoma in 2017 and was treated with radiation. He has complained of left shoulder and scapular pain for 2-3 years that has worsened. He adds that the pain has caused loss of appetite with 20 lbs weight loss over 3 months. Pt had MRI of L shoulder 12/23/21 that showed large heterogenous mass lesion with soft tissue emanating from scapula. Pt was referred by Dr. Ellin Saba for L shoulder mass biopsy.  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 Multiple rib lesions and lesions in the humerus or worrisome for metastatic disease.  Past Medical History:  Diagnosis Date   Renal cell carcinoma ([facility]) 10/23/2015   Skull lesion 10/20/2015   metastatic renal cell carcinoma    Past Surgical History:  Procedure Laterality Date   robotic radical right nephrectomy      Allergies: Patient has no known allergies.  Medications: Prior to Admission medications   Medication Sig Start Date End Date Taking? Authorizing Provider  Ascorbic Acid (VITAMIN C PO) Take 2 tablets by mouth daily.    [provider]  Biotin 2500 MCG CAPS Take 5,000 mcg by mouth daily.    [provider]  Cholecalciferol (VITAMIN D) 50 MCG (2000 UT) tablet Take 4,000 Units by mouth daily.    [provider]  Cyanocobalamin (B-12) 3000 MCG CAPS Take 6,000 mcg by mouth daily.    [provider]  ELDERBERRY PO Take 2  capsules by mouth daily.    [provider]  HYDROcodone-acetaminophen (NORCO) 10-325 MG tablet Take 0.5-1 tablets by mouth every 4 (four) hours as needed for moderate pain or severe pain (Take 0.5 tablets for moderate pain.  Take 1 tablet for severe pain.). 01/12/22   Carnella Guadalajara, PA-C  senna-docusate (SENOKOT-S) 8.6-50 MG tablet Take 1 tablet by mouth 2 (two) times daily.    [provider]     Family History  Problem Relation Age of Onset   CAD Mother    CAD Father    Cancer Paternal Uncle        stomach cancer   Cancer Paternal Uncle        unknown    Social History   Socioeconomic History   Marital status: Single    Spouse name: Not on file   Number of children: Not on file   Years of education: Not on file   Highest education level: Not on file  Occupational History   Occupation: Architect: OTHER    Comment: [employer]  Tobacco Use   Smoking status: Every Day    Packs/day: 0.50    Years: 45.00    Pack years: 22.50    Types: Cigarettes   Smokeless tobacco: Never  Vaping Use   Vaping Use: Never used  Substance and Sexual Activity   Alcohol use: Not Currently    Alcohol/week: 20.0 standard drinks    Types: 20 Shots of liquor per week    Comment: 1-5 shot of liquor per night   Drug use: Yes  Frequency: 1.0 times per week    Comment: occasionally   Sexual activity: Yes  Other Topics Concern   Not on file  Social History Narrative   Not on file   Social Determinants of Health   Financial Resource Strain: Not on file  Food Insecurity: Not on file  Transportation Needs: Not on file  Physical Activity: Not on file  Stress: Not on file  Social Connections: Not on file    Review of Systems: A 12 point ROS discussed and pertinent positives are indicated in the HPI above.  All other systems are negative.  Review of Systems  Constitutional:  Negative for chills and fever.  Respiratory:  Positive for shortness of  breath. Negative for cough.   Cardiovascular:  Negative for chest pain and leg swelling.  Gastrointestinal:  Negative for abdominal pain, nausea and vomiting.  Musculoskeletal:  Positive for back pain.  Neurological:  Positive for weakness. Negative for dizziness and headaches.   Vital Signs: BP 133/86   Pulse 95   Temp 98.2 F (36.8 C) (Oral)   Resp 16   Ht 5\' 7"  (1.702 m)   Wt 139 lb (63 kg)   SpO2 98%   BMI 21.77 kg/m   Physical Exam Vitals reviewed.  Constitutional:      General: He is not in acute distress.    Appearance: Normal appearance. He is not ill-appearing.  HENT:     Head: Normocephalic and atraumatic.     Mouth/Throat:     Mouth: Mucous membranes are dry.     Pharynx: Oropharynx is clear.  Eyes:     Extraocular Movements: Extraocular movements intact.     Pupils: Pupils are equal, round, and reactive to light.  Cardiovascular:     Rate and Rhythm: Normal rate and regular rhythm.     Pulses: Normal pulses.     Heart sounds: Normal heart sounds. No murmur heard. Pulmonary:     Effort: Pulmonary effort is normal. No respiratory distress.     Breath sounds: Normal breath sounds.  Abdominal:     General: Bowel sounds are normal. There is no distension.     Palpations: Abdomen is soft.     Tenderness: There is no abdominal tenderness. There is no guarding.  Musculoskeletal:     Right lower leg: No edema.     Left lower leg: No edema.  Skin:    General: Skin is warm and dry.  Neurological:     Mental Status: He is alert and oriented to person, place, and time.  Psychiatric:        Mood and Affect: Mood normal.        Behavior: Behavior normal.        Thought Content: Thought content normal.        Judgment: Judgment normal.    Imaging: MR Brain W Wo Contrast  Result Date: 01/16/2022 CLINICAL DATA:  Metastatic renal cell carcinoma EXAM: MRI HEAD WITHOUT AND WITH CONTRAST TECHNIQUE: Multiplanar, multiecho pulse sequences of the brain and surrounding  structures were obtained without and with intravenous contrast. CONTRAST:  12mL MULTIHANCE GADOBENATE DIMEGLUMINE 529 MG/ML IV SOLN COMPARISON:  Brain MRI 10/27/2020 FINDINGS: Brain: There is a 1.1 cm AP x 1.1 cm TV peripherally enhancing lesion in the anterior aspect of the right cerebellar hemisphere abutting the middle cerebellar peduncle with mild surrounding edema. There is a 1.5 cm by 0.9 cm enhancing lesion more posterolaterally in the right cerebellar hemisphere with mild surrounding edema. There is  no significant mass effect or effacement of fourth ventricle. No other parenchymal metastases are seen. There is no acute intracranial hemorrhage, extra-axial fluid collection, or acute infarct. Background parenchymal volume is normal. The ventricles are normal in size. Gray-white differentiation is preserved. Punctate chronic microhemorrhages in the right corona radiata and left cerebellar hemisphere are noted, nonspecific. There is no midline shift Vascular: Normal flow voids. Skull and upper cervical spine: The enhancing lesion centered in the right clivus extending inferolaterally to the right occipital condyle is again seen. The lesion again encases the internal carotid artery (14-44). Sinuses/Orbits: The paranasal sinuses are clear. The globes and orbits are unremarkable. Other: The enhancing lesion in the left parietal scalp has slightly increased in size. There is no evidence of invasion of the underlying calvarium. IMPRESSION: 1. 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 2. No significant interval change in size or appearance of the right skull base metastatic lesion. 3. Increased size of the left parietal scalp lesion, again favored benign. Recommend correlation with physical exam. Electronically Signed   By: Lesia Hausen M.D.   On: 01/16/2022 09:16   MR SHOULDER LEFT WO CONTRAST  Result Date: 12/24/2021 CLINICAL DATA:   Severe left shoulder pain and weakness. EXAM: MRI OF THE LEFT SHOULDER WITHOUT CONTRAST TECHNIQUE: Multiplanar, multisequence MR imaging of the shoulder was performed. No intravenous contrast was administered. COMPARISON:  Plain films left shoulder 10/27/2020. FINDINGS: Rotator cuff:  Intact.  Mild appearing tendinopathy noted. Muscles:  No atrophy. Biceps long head:  Intact. Acromioclavicular Joint: Mild osteoarthritis. Type 2 acromion. No subacromial/subdeltoid bursal fluid. Glenohumeral Joint: Negative. Labrum:  Intact Bones: There is a heterogeneous mass lesion which is incompletely visualized which is centered in scapular and measures at least 11 cm transverse by a 9 cm AP by 8.5 cm craniocaudal. Heterogeneous appearance of the lesion is most consistent with chondroid matrix. It invades the musculature of the rotator cuff. The patient also has a 1 cm mixed signal intensity lesion in the metaphysis of the humerus and a 0.6 cm lesion in the proximal diaphysis of the humerus. Multiple rib lesions are seen. One of the largest measures 1.6 cm in length and is in the posterior arc of the left rib at proximally the angle of the scapula. Other: None. 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 Multiple rib lesions and lesions in the humerus or worrisome for metastatic disease. These results will be called to the ordering clinician or representative by the Radiologist Assistant, and communication documented in the PACS or [employer]. Electronically Signed   By: Drusilla Kanner M.D.   On: 12/24/2021 07:39    Labs:  CBC: Recent Labs    01/17/22 0828  WBC 10.0  HGB 13.1  HCT 40.6  PLT 316    COAGS: Recent Labs    01/17/22 0828  INR 1.1    BMP: No results for input(s): NA, K, CL, CO2, GLUCOSE, BUN, CALCIUM, CREATININE, GFRNONAA, GFRAA in the last 8760 hours.  Invalid input(s):  CMP  LIVER FUNCTION TESTS: No results for input(s): BILITOT, AST, ALT, ALKPHOS, PROT, ALBUMIN in the last 8760 hours.  TUMOR MARKERS: No results for input(s): AFPTM, CEA, CA199, CHROMGRNA in the last 8760 hours.  Assessment and Plan: History of metastatic renal cell carcinoma with right nephrectomy. Pt was diagnosed with renal cell carcinoma in 2017 and treated with radiation.  He has complained of left shoulder and scapular pain for 2-3 years that has worsened. He adds that the pain has caused loss of appetite with 20 lbs weight loss over 3 months. Pt had MRI of L shoulder 12/23/21 that showed large heterogenous mass lesion with soft tissue emanating from scapula. Pt was referred by Dr. Ellin Saba for L shoulder mass biopsy.  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 Multiple rib lesions and lesions in the humerus or worrisome for metastatic disease.  Pt resting on stretcher. He is A&O, calm and pleasant.  He is in no distress.  Pt states he is NPO per order.  Pt states that he is unable to sit upright. He states that he must lay down to eat, sleep, ride in car. He adds that he took 1/2 of 10/325 "Oxy" this morning PTA.  Labs WNL  Risks and benefits of left shoulder mass was discussed with the patient and/or patient's family including, but not limited to bleeding, infection, damage to adjacent structures or low yield requiring additional tests.  All of the questions were answered and there is agreement to proceed.  Consent signed and in chart.   Thank you for this interesting consult.  I greatly enjoyed meeting Gregory [name]. and look forward to participating in their care.  A copy of this report was sent to the requesting provider on this date.  Electronically Signed: Shon Hough, NP 01/17/2022, 9:19 AM   I spent a total of 20  minutes in face to face in clinical consultation, greater than 50% of which was counseling/coordinating care for left shoulder mass biopsy.

## 2022-01-17 ENCOUNTER — Other Ambulatory Visit: Payer: Self-pay

## 2022-01-17 ENCOUNTER — Encounter (HOSPITAL_COMMUNITY): Payer: Self-pay

## 2022-01-17 ENCOUNTER — Ambulatory Visit (HOSPITAL_COMMUNITY)
Admission: RE | Admit: 2022-01-17 | Discharge: 2022-01-17 | Disposition: A | Discharge status: Home / Self Care | Payer: Medicare Other | Source: Ambulatory Visit | Attending: Hematology | Admitting: Hematology

## 2022-01-17 DIAGNOSIS — R2232 Localized swelling, mass and lump, left upper limb: Secondary | ICD-10-CM | POA: Insufficient documentation

## 2022-01-17 DIAGNOSIS — C7951 Secondary malignant neoplasm of bone: Secondary | ICD-10-CM | POA: Diagnosis not present

## 2022-01-17 DIAGNOSIS — C649 Malignant neoplasm of unspecified kidney, except renal pelvis: Secondary | ICD-10-CM | POA: Insufficient documentation

## 2022-01-17 DIAGNOSIS — M25512 Pain in left shoulder: Secondary | ICD-10-CM | POA: Insufficient documentation

## 2022-01-17 LAB — CBC
HCT: 40.6 % (ref 39.0–52.0)
Hemoglobin: 13.1 g/dL (ref 13.0–17.0)
MCH: 27.6 pg (ref 26.0–34.0)
MCHC: 32.3 g/dL (ref 30.0–36.0)
MCV: 85.7 fL (ref 80.0–100.0)
Platelets: 316 10*3/uL (ref 150–400)
RBC: 4.74 MIL/uL (ref 4.22–5.81)
RDW: 14.1 % (ref 11.5–15.5)
WBC: 10 10*3/uL (ref 4.0–10.5)
nRBC: 0 % (ref 0.0–0.2)

## 2022-01-17 LAB — PROTIME-INR
INR: 1.1 (ref 0.8–1.2)
Prothrombin Time: 14.3 seconds (ref 11.4–15.2)

## 2022-01-17 MED ORDER — KETOROLAC TROMETHAMINE 15 MG/ML IJ SOLN
INTRAMUSCULAR | Status: AC
  Filled 2022-01-17: qty 1

## 2022-01-17 MED ORDER — MIDAZOLAM HCL 2 MG/2ML IJ SOLN
INTRAMUSCULAR | Status: AC | PRN
  Administered 2022-01-17 (×2): 1 mg via INTRAVENOUS

## 2022-01-17 MED ORDER — KETOROLAC TROMETHAMINE 15 MG/ML IJ SOLN
15.0000 mg | Freq: Once | INTRAMUSCULAR | Status: AC
  Administered 2022-01-17: 15 mg via INTRAVENOUS
  Filled 2022-01-17: qty 1

## 2022-01-17 MED ORDER — LIDOCAINE HCL 1 % IJ SOLN
INTRAMUSCULAR | Status: AC
Start: 2022-01-17 — End: 2022-01-17
  Filled 2022-01-17: qty 10

## 2022-01-17 MED ORDER — GELATIN ABSORBABLE 12-7 MM EX MISC
CUTANEOUS | Status: AC
  Filled 2022-01-17: qty 1

## 2022-01-17 MED ORDER — MIDAZOLAM HCL 2 MG/2ML IJ SOLN
INTRAMUSCULAR | Status: AC
  Filled 2022-01-17: qty 4

## 2022-01-17 MED ORDER — FENTANYL CITRATE (PF) 100 MCG/2ML IJ SOLN
INTRAMUSCULAR | Status: AC
  Filled 2022-01-17: qty 4

## 2022-01-17 MED ORDER — SODIUM CHLORIDE 0.9 % IV SOLN: INTRAVENOUS | Status: DC

## 2022-01-17 MED ORDER — FENTANYL CITRATE (PF) 100 MCG/2ML IJ SOLN
INTRAMUSCULAR | Status: AC | PRN
  Administered 2022-01-17 (×3): 50 ug via INTRAVENOUS

## 2022-01-17 NOTE — Procedures (Signed)
Interventional Radiology Procedure Note ? ?Procedure: CT guided biopsy of left shoulder mass. Mx 18g core ?Complications: None ?EBL: None ?Recommendations: ?- Bedrest 1 hours.   ?- Routine wound care ?- Follow up pathology ?- Advance diet  ? ?Signed, ? ?Corrie Mckusick, DO ? ? ?

## 2022-01-18 ENCOUNTER — Other Ambulatory Visit (HOSPITAL_COMMUNITY): Payer: Self-pay | Admitting: Hematology

## 2022-01-18 ENCOUNTER — Other Ambulatory Visit (HOSPITAL_COMMUNITY): Payer: Self-pay

## 2022-01-18 ENCOUNTER — Other Ambulatory Visit: Payer: Self-pay | Admitting: Radiation Therapy

## 2022-01-18 ENCOUNTER — Ambulatory Visit (HOSPITAL_COMMUNITY): Admission: RE | Admit: 2022-01-18 | Payer: Medicare Other | Source: Ambulatory Visit

## 2022-01-18 ENCOUNTER — Encounter (HOSPITAL_COMMUNITY): Payer: Self-pay

## 2022-01-18 DIAGNOSIS — C649 Malignant neoplasm of unspecified kidney, except renal pelvis: Secondary | ICD-10-CM

## 2022-01-18 MED ORDER — OXYCODONE HCL 10 MG PO TABS: 10.0000 mg | ORAL_TABLET | ORAL | 0 refills | Status: DC | PRN

## 2022-01-18 MED ORDER — FENTANYL 50 MCG/HR TD PT72: 1.0000 | MEDICATED_PATCH | TRANSDERMAL | 0 refills | Status: DC

## 2022-01-18 NOTE — Progress Notes (Signed)
Order placed for CT CAP and Bone Scan per Dr. Delton Coombes

## 2022-01-18 NOTE — Progress Notes (Signed)
Call received from Shopiere, patient's daughter, stating that the patient was unable to do PET scan again today due to uncontrolled pain. She reports that the patient has been taking pain medication every 6 hours but only achieves about 30 minutes of decreased pain. She states that patient cannot tolerate the PET scan wait time. Discussed with Dr. Delton Coombes who recommends IV pain medication during the wait time for PET scan but that cannot be done at [facility]. Patient declines to go elsewhere. Orders received for [facility] Bone Scan and CT CAP in place of the PET scan. Schedule pending. For at home pain management, order received for Oxycodone '10mg'$  every 4 hours. Patient's daughter made aware. Order sent to Dr. Delton Coombes to sign off on.

## 2022-01-19 ENCOUNTER — Other Ambulatory Visit: Payer: Self-pay | Admitting: Radiation Therapy

## 2022-01-19 ENCOUNTER — Telehealth: Payer: Self-pay | Admitting: Urology

## 2022-01-19 DIAGNOSIS — C7931 Secondary malignant neoplasm of brain: Secondary | ICD-10-CM

## 2022-01-19 DIAGNOSIS — C649 Malignant neoplasm of unspecified kidney, except renal pelvis: Secondary | ICD-10-CM

## 2022-01-19 LAB — SURGICAL PATHOLOGY

## 2022-01-19 NOTE — Telephone Encounter (Signed)
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 We discussed the recommendation for a single fraction SRS to the lesions and he is in agreement to proceed. I went over the dates/times for 3T planning MRI brain at [facility] on 01/31/22, CT [facility] 02/02/22 and SRS treatment on 02/08/22 and he wrote these down as we were talking and did not see any conflicts with any of the dates/times. I advised that I will share this information with Dr. Delton Coombes to make sure we keep everyone in the loop as we are moving forward with treatment planning.  He is scheduled for a CT C/A/P and bone scan on 01/30/2022 to complete his disease restaging.  He did have a recent CT-guided biopsy of the left shoulder mass on 01/17/2022, presumably metastatic RCC that path pending at present.  He has a scheduled follow-up visit with Dr. Delton Coombes on 02/01/2022 to review the findings from all of the studies and discuss further treatment recommendations at that time.  He was comfortable and in agreement with the stated plan but knows that he is welcome to call at anytime in the interim with any questions or concerns.  Nicholos Johns, MMS, PA-C [facility]: [phone]  Fax: [phone]

## 2022-01-22 ENCOUNTER — Inpatient Hospital Stay: Payer: Medicare Other | Attending: Radiation Oncology

## 2022-01-22 ENCOUNTER — Ambulatory Visit (HOSPITAL_COMMUNITY): Payer: Medicare Other | Admitting: Hematology

## 2022-01-22 NOTE — Telephone Encounter (Signed)
See above

## 2022-01-24 ENCOUNTER — Ambulatory Visit (HOSPITAL_COMMUNITY): Payer: Medicare Other | Admitting: Hematology

## 2022-01-25 ENCOUNTER — Encounter (HOSPITAL_COMMUNITY): Payer: Self-pay

## 2022-01-25 ENCOUNTER — Other Ambulatory Visit (HOSPITAL_COMMUNITY): Payer: Medicare Other

## 2022-01-30 ENCOUNTER — Ambulatory Visit (HOSPITAL_COMMUNITY)
Admission: RE | Admit: 2022-01-30 | Discharge: 2022-01-30 | Disposition: A | Discharge status: Home / Self Care | Payer: Medicare Other | Source: Ambulatory Visit | Attending: Hematology | Admitting: Hematology

## 2022-01-30 ENCOUNTER — Encounter (HOSPITAL_COMMUNITY)
Admission: RE | Admit: 2022-01-30 | Discharge: 2022-01-30 | Disposition: A | Discharge status: Home / Self Care | Payer: Medicare Other | Source: Ambulatory Visit | Attending: Hematology | Admitting: Hematology

## 2022-01-30 ENCOUNTER — Encounter (HOSPITAL_COMMUNITY): Payer: Self-pay

## 2022-01-30 DIAGNOSIS — C649 Malignant neoplasm of unspecified kidney, except renal pelvis: Secondary | ICD-10-CM | POA: Insufficient documentation

## 2022-01-30 DIAGNOSIS — C7951 Secondary malignant neoplasm of bone: Secondary | ICD-10-CM | POA: Diagnosis present

## 2022-01-30 LAB — POCT I-STAT CREATININE: Creatinine, Ser: 1.3 mg/dL — ABNORMAL HIGH (ref 0.61–1.24)

## 2022-01-30 MED ORDER — IOHEXOL 300 MG/ML  SOLN
100.0000 mL | Freq: Once | INTRAMUSCULAR | Status: AC | PRN
  Administered 2022-01-30: 100 mL via INTRAVENOUS

## 2022-01-30 MED ORDER — TECHNETIUM TC 99M MEDRONATE IV KIT
20.0000 | PACK | Freq: Once | INTRAVENOUS | Status: AC | PRN
  Administered 2022-01-30: 20.9 via INTRAVENOUS

## 2022-01-30 NOTE — Progress Notes (Signed)
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 There is involvement of the medial clivus, occipital condyle, petrous apex, with extension into the RIGHT cavernous sinus and sphenoid sinus...    10/21/2015 Imaging   CT abd/pelvis- Enlarged irregular enhancing mass partially exophytic from the RIGHT kidney consistent with RENAL CELL CARCINOMA. No evidence of involvement of the RIGHT renal vein.No retroperitoneal lymphadenopathy.    10/21/2015 Imaging   CT chest- No evidence thoracic metastasis. Nodule along the RIGHT oblique fissure likely represents a benign intrafissural lymph node    10/21/2015 Imaging   MRI brain- Destructive right central/posterior skullbase mass consistent with metastasis as previously described.    10/23/2015 Initial Diagnosis   Renal cell carcinoma ([facility])    11/01/2015 Procedure   US guided biopsy by IR    11/02/2015 Pathology Results   CLEAR CELL RENAL CELL CARCINOMA, WHO NUCLEAR GRADE 2      CANCER STAGING: Cancer Staging  Metastatic renal cell carcinoma to bone [facility]) Staging form: Kidney, AJCC 8th Edition - Clinical stage from 01/03/2022: Stage IV (cT1b, cNX, pM1) - Unsigned   INTERVAL HISTORY:  Gregory [name]., a 66 y.o. 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 Gregory [name] was last seen on 01/03/2022.   ***  REVIEW OF SYSTEMS:  Review of Systems  All other systems reviewed and are negative.  PAST MEDICAL/SURGICAL  HISTORY:  Past Medical History:  Diagnosis Date   Renal cell carcinoma ([place]) 10/23/2015   Skull lesion 10/20/2015   metastatic renal cell carcinoma   Past Surgical History:  Procedure Laterality Date   robotic radical right nephrectomy      SOCIAL HISTORY:  Social History   Socioeconomic History   Marital status: Single    Spouse name: Not on file   Number of children: Not on file   Years of education: Not on file   Highest education level: Not on file  Occupational History   Occupation: Programmer, systems: OTHER    Comment: [employer]  Tobacco Use   Smoking status: Every Day    Packs/day: 0.50    Years: 45.00    Pack years: 22.50    Types: Cigarettes   Smokeless tobacco: Never  Vaping Use   Vaping Use: Never used  Substance and Sexual Activity   Alcohol use: Not Currently    Alcohol/week: 20.0 standard drinks    Types: 20 Shots of liquor per week    Comment: 1-5 shot of liquor per night   Drug use: Yes    Frequency: 1.0 times per week    Comment: occasionally   Sexual activity: Yes  Other Topics Concern   Not on file  Social History Narrative   Not on file   Social Determinants of Health   Financial Resource Strain: Not on file  Food Insecurity: Not on file  Transportation Needs: Not on file  Physical Activity: Not on file  Stress: Not on file  Social Connections: Not on file  Intimate Partner Violence: Not on file    FAMILY HISTORY:  Family History  Problem Relation Age of Onset   CAD Mother    CAD Father    Cancer Paternal Uncle        stomach cancer   Cancer Paternal Uncle        unknown    CURRENT MEDICATIONS:  Current Outpatient Medications  Medication Sig Dispense Refill   Ascorbic Acid (VITAMIN C PO) Take 2 tablets by mouth daily.     Biotin 2500 MCG CAPS Take 5,000 mcg by mouth daily.     Cholecalciferol (VITAMIN D) 50 MCG (2000 UT) tablet Take 4,000 Units by mouth daily.     Cyanocobalamin (B-12) 3000 MCG CAPS Take 6,000 mcg by  mouth daily.     ELDERBERRY PO Take 2 capsules by mouth daily.     fentaNYL (DURAGESIC) 50 MCG/HR Place 1 patch onto the skin every 3 (three) days. 5 patch 0   HYDROcodone-acetaminophen (NORCO) 10-325 MG tablet Take 0.5-1 tablets by mouth every 4 (four) hours as needed for moderate pain or severe pain (Take 0.5 tablets for moderate pain.  Take 1 tablet for severe pain.). 60 tablet 0   Oxycodone HCl 10 MG TABS Take 1 tablet (10 mg total) by mouth every 4 (four) hours as needed. 84 tablet 0   senna-docusate (SENOKOT-S) 8.6-50 MG tablet Take 1 tablet by mouth 2 (two) times daily.     No current facility-administered medications for this visit.    ALLERGIES:  No Known Allergies  PHYSICAL EXAM:  Performance status (ECOG): 1 - Symptomatic but completely ambulatory  There were no vitals filed for this visit. Wt Readings from Last 3 Encounters:  01/17/22 139 lb (63 kg)  01/03/22 139 lb 11.2 oz (63.4 kg)  10/27/20 162 lb (73.5 kg)   Physical Exam   LABORATORY DATA:  I have reviewed the labs as listed.     Latest Ref Rng & Units 01/17/2022    8:28 AM 05/15/2017    8:26 AM 11/01/2015    7:00 AM  CBC  WBC 4.0 - 10.5 K/uL 10.0   9.1   10.4    Hemoglobin 13.0 - 17.0 g/dL 13.1   17.5   17.9    Hematocrit 39.0 - 52.0 % 40.6   51.9   52.3    Platelets 150 - 400 K/uL 316   199   227        Latest Ref Rng & Units 01/30/2022   11:06 AM 05/15/2017    8:26 AM 02/20/2016    2:22 PM  CMP  Glucose 70 - 140 mg/dl  91     BUN 7.0 - 26.0 mg/dL  13.4     Creatinine 0.61 - 1.24 mg/dL 1.30   1.2   1.30    Sodium 136 - 145 mEq/L  136     Potassium 3.5 - 5.1 mEq/L  4.4     CO2 22 - 29 mEq/L  24     Calcium 8.4 - 10.4 mg/dL  9.2     Total Protein 6.4 - 8.3 g/dL  7.0     Total Bilirubin 0.20 - 1.20 mg/dL  0.55     Alkaline Phos 40 - 150 U/L  95     AST 5 - 34 U/L  12     ALT 0 - 55 U/L  12       DIAGNOSTIC IMAGING:  I have independently reviewed the scans and discussed with  the patient. MR Brain W  Wo Contrast  Result Date: 01/16/2022 CLINICAL DATA:  Metastatic renal cell carcinoma EXAM: MRI HEAD WITHOUT AND WITH CONTRAST TECHNIQUE: Multiplanar, multiecho pulse sequences of the brain and surrounding structures were obtained without and with intravenous contrast. CONTRAST:  77m MULTIHANCE GADOBENATE DIMEGLUMINE 529 MG/ML IV SOLN COMPARISON:  Brain MRI 10/27/2020 FINDINGS: Brain: There is a 1.1 cm AP x 1.1 cm TV peripherally enhancing lesion in the anterior aspect of the right cerebellar hemisphere abutting the middle cerebellar peduncle with mild surrounding edema. There is a 1.5 cm by 0.9 cm enhancing lesion more posterolaterally in the right cerebellar hemisphere with mild surrounding edema. There is no significant mass effect or effacement of fourth ventricle. No other parenchymal metastases are seen. There is no acute intracranial hemorrhage, extra-axial fluid collection, or acute infarct. Background parenchymal volume is normal. The ventricles are normal in size. Gray-white differentiation is preserved. Punctate chronic microhemorrhages in the right corona radiata and left cerebellar hemisphere are noted, nonspecific. There is no midline shift Vascular: Normal flow voids. Skull and upper cervical spine: The enhancing lesion centered in the right clivus extending inferolaterally to the right occipital condyle is again seen. The lesion again encases the internal carotid artery (14-44). Sinuses/Orbits: The paranasal sinuses are clear. The globes and orbits are unremarkable. Other: The enhancing lesion in the left parietal scalp has slightly increased in size. There is no evidence of invasion of the underlying calvarium. IMPRESSION: 1. 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 2. No significant interval change in size or appearance of the right skull base metastatic lesion. 3. Increased size of the left parietal scalp lesion, again  favored benign. Recommend correlation with physical exam. Electronically Signed   By: PValetta MoleM.D.   On: 01/16/2022 09:16   NM Bone Scan Whole Body  Result Date: 01/30/2022 CLINICAL DATA:  Renal cell carcinoma EXAM: NUCLEAR MEDICINE WHOLE BODY BONE SCAN TECHNIQUE: Whole body anterior and posterior images were obtained approximately 3 hours after intravenous injection of radiopharmaceutical. RADIOPHARMACEUTICALS:  20.9 mCi Technetium-991mDP IV COMPARISON:  Same day CT. FINDINGS: Photopenia in the right renal fossa compatible with prior nephrectomy. There is left renal uptake with excretion. Heterogeneous uptake within the axial and appendicular skeleton with areas of more focal uptake including within the left scapula, bilateral ribs, and thoracolumbar spine. Additional small focus of activity within the region of the left calvarium or scalp. IMPRESSION: Findings compatible with known widespread osseous metastatic disease. Scintigraphic activity is significantly less than expected given the size and number of metastatic lesions seen by concurrently obtained CT. Electronically Signed   By: NiDavina Poke.O.   On: 01/30/2022 14:00   CT CHEST ABDOMEN PELVIS W CONTRAST  Result Date: 01/30/2022 CLINICAL DATA:  Renal cell carcinoma, staging evaluation in this [age]ear old male * Tracking Code: BO * EXAM: CT CHEST, ABDOMEN, AND PELVIS WITH CONTRAST TECHNIQUE: Multidetector CT imaging of the chest, abdomen and pelvis was performed following the standard protocol during bolus administration of intravenous contrast. RADIATION DOSE REDUCTION: This exam was performed according to the departmental dose-optimization program which includes automated exposure control, adjustment of the mA and/or kV according to patient size and/or use of iterative reconstruction technique. CONTRAST:  10049mMNIPAQUE IOHEXOL 300 MG/ML  SOLN COMPARISON:  CT chest, abdomen and pelvis from 2018. FINDINGS: CT CHEST FINDINGS  Cardiovascular: Normal heart size. Normal caliber of the thoracic aorta. Scattered aortic atherosclerosis. Central pulmonary  arteries are unremarkable on venous phase. Mediastinum/Nodes: No thoracic inlet lymphadenopathy. No axillary lymphadenopathy. No mediastinal lymphadenopathy. RIGHT infrahilar mass or adenopathy leading to collapse of the RIGHT lower lobe, see below. Lungs/Pleura: Pulmonary emphysema. Scattered tiny pulmonary nodules which are innumerable in both the LEFT and RIGHT chest., for example on image 119/3 there is a 5 mm LEFT lower lobe nodule with least 20 additional LEFT lower lobe nodules none greater than 1 cm. RIGHT upper lobe pulmonary nodule (image 79/3) this is the largest nodule aside from the central mass in the RIGHT chest at 9 mm size. Similar size and distribution of nodules throughout the LEFT upper lobe. Collapse of the RIGHT lower lobe with central convex margins measuring up to 5.7 x 5.6 cm,. No pleural effusion with near complete collapse of the RIGHT lower lobe. Airways in the chest are otherwise patent. Musculoskeletal: See below for full musculoskeletal details with numerous destructive lesions throughout the bony thorax and throughout the entire spine and pelvis. CT ABDOMEN PELVIS FINDINGS Hepatobiliary: Hepatic metastatic disease affecting both LEFT and RIGHT hepatic lobes. (Image 54/2) 12 mm LEFT hepatic lobe lesion. (Image 64/2) 18 mm RIGHT hepatic lobe lesion. At least 4 additional lesions in the liver. Hepatic steatosis in the background. No pericholecystic stranding. No biliary duct dilation. Portal vein is patent.  Hepatic veins are patent. Pancreas: Normal, without mass, inflammation or ductal dilatation. Spleen: Normal. Adrenals/Urinary Tract: Post RIGHT nephrectomy. Adrenal glands are normal. Lesion in the LEFT kidney, upper pole, suspicious for solid renal neoplasm (image 60/2) also seen on image 12/8 at approximately 2 cm. Additional potential solid lesion and or  metastatic lesion at 7 mm (image 15/8) Third lesion in the LEFT kidney in the lower pole measuring 2.4 cm (image 25/8). Nephrolithiasis in the lower pole with 4-5 mm calculus in this area. No ureteral calculi or hydronephrosis. Stomach/Bowel: Large volume of stool in the rectum is well-formed, no perirectal stranding or signs of bowel obstruction. Moderate volume of stool elsewhere. No acute gastric or small bowel process. Vascular/Lymphatic: Scattered small lymph nodes throughout the retroperitoneum along the LEFT para-aortic chain largest approximately 10 mm (image 74/2) these are new since previous imaging from 2018. No additional upper abdominal or retroperitoneal lymphadenopathy. RIGHT groin adenopathy with heterogeneous enhancement (image 111/2) 18 mm short axis smaller adjacent lymph node (image 107/2) 12 mm short axis. Slight enlargement of contralateral lymph nodes but without the degree of heterogeneity seen on the RIGHT. Skin thickening of the RIGHT hemi scrotum and RIGHT groin (image 123/2) Reproductive: LEFT hydrocele incompletely evaluated, imaged on previous study. Other: No ascites.  No pneumoperitoneum. Musculoskeletal: Destruction of LEFT scapula as seen on recent MR imaging, previously biopsied. Multifocal bony metastases and intramuscular metastatic lesions. Nearly every level of the spine is involved. Numerous lytic foci throughout the spine. Lesions with notable features of posterior cortical destruction and or frank soft tissue. (Image 50/2) 19 x 17 mm lesion associated with superior endplate pathologic fracture and likely pathologic fracture of the RIGHT T12 pedicle shows soft tissue extension into the central canal. 20-30% loss of height at this level. Pathologic fracture of the superior endplate of L3 with posterior cortical displacement which is mild and associated with soft tissue density extending into the anterior central canal causing moderate central canal narrowing. RIGHT S1 lesion  with frank destruction of the S1 level on the RIGHT extending in the sacral ala measuring 4.3 x 3.2 cm greatest axial dimension. Pathologic fractures of indeterminate chronicity involving T5 with approximately 10-20%  loss of height and T10 with approximately 30-40% loss of height. At T10 there is subtle soft tissue along the posterior vertebral margin. Destructive pelvic lesion (image 93/2) 2.7 cm soft tissue lesion destroying LEFT lateral iliac bone. Multifocal lytic lesions in the ribs are mainly small. Expansile component of rib destruction of the RIGHT posterior sixth rib and RIGHT 6 transverse process with a small amount of extrapleural soft tissue (image 23/2) this measures 2 cm. Sternal manubrial lesion measuring 15 mm with measurable soft tissue and sternal destruction. Scattered lytic foci in the RIGHT scapula. Other small scattered lytic pelvic lesions. Heterogeneous appearance of marrow space of RIGHT and LEFT proximal femur also raising the question of early metastatic disease to this area, endosteal scalloping seen on image 120/2 as an example. Intramuscular lesions of LEFT gluteal musculature (image 105/2 and image 96/2. RIGHT erector spinae musculature with involvement as well. Additional focus along the chest wall/upper abdomen in the upper rectus muscle (image 13/4). IMPRESSION: Signs of extensive bony metastatic disease with areas of pathologic fracture in the spine which are age indeterminate but new compared to previous imaging. Posterior cortical disruption and central canal extension greatest at T12 and L3 as discussed. Spinal MRI could be considered for further evaluation as clinically warranted. Additional involvement of pelvis, ribs and sternum as well as bilateral scapulae Pulmonary metastatic, nodal and hepatic metastatic disease. Contralateral, LEFT renal lesions either metastatic or new primary renal cell carcinoma with signs of prior RIGHT nephrectomy. Intramuscular metastatic lesions.  Distended rectum full of formed stool, correlate with any symptoms of fecal impaction. No signs of obstruction currently. These results will be called to the ordering clinician or representative by the Radiologist Assistant, and communication documented in the PACS or [employer]. Electronically Signed   By: Zetta Bills M.D.   On: 01/30/2022 12:13   CT BIOPSY  Result Date: 01/17/2022 INDICATION: 66 year old male referred for biopsy of destructive left shoulder mass, anticipating RCC metastasis EXAM: CT BIOPSY MEDICATIONS: None. ANESTHESIA/SEDATION: Moderate (conscious) sedation was employed during this procedure. A total of Versed 2.0 mg and Fentanyl 150 mcg was administered intravenously. Moderate Sedation Time: 20 minutes. The patient's level of consciousness and vital signs were monitored continuously by radiology nursing throughout the procedure under my direct supervision. FLUOROSCOPY TIME:  CT COMPLICATIONS: None PROCEDURE: Informed written consent was obtained from the patient after a thorough discussion of the procedural risks, benefits and alternatives. All questions were addressed. Maximal Sterile Barrier Technique was utilized including caps, mask, sterile gowns, sterile gloves, sterile drape, hand hygiene and skin antiseptic. A timeout was performed prior to the initiation of the procedure. Patient positioned right decubitus on the TT gantry table. Scout CT acquired for planning purposes. The patient is prepped and draped in the usual sterile fashion. 1% lidocaine was used for local anesthesia. Using CT guidance, introducer needle was advanced into the destructive soft tissue lesion involving the scapula. Once we confirmed needle tip position multiple 18 gauge core biopsy were acquired. Specimen was placed into formalin solution. Gel-Foam slurry was administered on withdrawal of the needle. Final images were stored. Patient tolerated procedure well and remained hemodynamically stable  throughout. No complications were encountered and no significant blood loss. IMPRESSION: Status post CT-guided biopsy of destructive left scapular mass. Signed, Dulcy Fanny. Dellia Nims, RPVI [facility] Electronically Signed   By: Corrie Mckusick D.O.   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  PT1b PNx. - He was followed by Dr. Alen Blew, last seen on 05/08/2017. - He reported left shoulder pain for the last couple of years. - He was evaluated by Dr. Chauncey Reading and then will and MRI was ordered. - Left shoulder MRI (12/23/2021): Large heterogeneous mass lesion with soft tissue extension emanating from scapula.  Multiple rib lesions and lesion in the humerus. - He was evaluated by Dr. Tammi Klippel on 12/29/2021 and was recommended palliative XRT.    Social/family history: - He is seen today with his girlfriend.  He worked as Clinical biochemist for 46 years.  He is a current active smoker, currently smoking half pack per day.  Previously smoked up to 2 packs/day for 50 years. - Mother had throat cancer.  2 paternal uncles and 1 paternal aunt had cancers.   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 - Most likely this shoulder lesion is metastatic renal cell carcinoma. - Recommend PET CT scan for baseline staging.  We will also obtain brain MRI with and without contrast at an open MRI center as he is claustrophobic. - Recommend left scapular mass biopsy by IR. - RTC after biopsy to discuss treatment options.  We will also obtain labs including LDH for IMDC risk scoring.  2.  Left shoulder pain: -  Currently taking hydrocodone 10/325 half tablet every 5 hours. - Recommend increasing hydrocodone to half tablet every 4 hours as pain is not well controlled. - Recommend stool softener Colace and MiraLAX as needed for constipation.  He is currently taking Metamucil Gummies 3/day which is not helping.     Orders placed this encounter:  No orders of the defined types were placed in this encounter.    Derek Jack, MD [place] [phone]   I, Thana Ates, am acting as a scribe for Dr. Derek Jack.  {Add Barista Statement}

## 2022-01-31 ENCOUNTER — Other Ambulatory Visit: Payer: Medicare Other

## 2022-02-01 ENCOUNTER — Other Ambulatory Visit: Payer: Self-pay

## 2022-02-01 ENCOUNTER — Inpatient Hospital Stay (HOSPITAL_COMMUNITY): Payer: Medicare Other | Attending: Hematology | Admitting: Hematology

## 2022-02-01 ENCOUNTER — Encounter (HOSPITAL_COMMUNITY): Payer: Self-pay | Admitting: Hematology

## 2022-02-01 VITALS — BP 153/95 | HR 114 | Temp 97.9°F | Resp 20 | Wt 129.4 lb

## 2022-02-01 DIAGNOSIS — R634 Abnormal weight loss: Secondary | ICD-10-CM | POA: Insufficient documentation

## 2022-02-01 DIAGNOSIS — C787 Secondary malignant neoplasm of liver and intrahepatic bile duct: Secondary | ICD-10-CM | POA: Insufficient documentation

## 2022-02-01 DIAGNOSIS — C7951 Secondary malignant neoplasm of bone: Secondary | ICD-10-CM | POA: Diagnosis not present

## 2022-02-01 DIAGNOSIS — M549 Dorsalgia, unspecified: Secondary | ICD-10-CM | POA: Insufficient documentation

## 2022-02-01 DIAGNOSIS — Z808 Family history of malignant neoplasm of other organs or systems: Secondary | ICD-10-CM | POA: Diagnosis not present

## 2022-02-01 DIAGNOSIS — M25512 Pain in left shoulder: Secondary | ICD-10-CM | POA: Diagnosis not present

## 2022-02-01 DIAGNOSIS — D72829 Elevated white blood cell count, unspecified: Secondary | ICD-10-CM | POA: Insufficient documentation

## 2022-02-01 DIAGNOSIS — Z5112 Encounter for antineoplastic immunotherapy: Secondary | ICD-10-CM | POA: Diagnosis present

## 2022-02-01 DIAGNOSIS — F1090 Alcohol use, unspecified, uncomplicated: Secondary | ICD-10-CM | POA: Insufficient documentation

## 2022-02-01 DIAGNOSIS — Z905 Acquired absence of kidney: Secondary | ICD-10-CM | POA: Diagnosis not present

## 2022-02-01 DIAGNOSIS — Z800 Family history of malignant neoplasm of digestive organs: Secondary | ICD-10-CM | POA: Insufficient documentation

## 2022-02-01 DIAGNOSIS — F17210 Nicotine dependence, cigarettes, uncomplicated: Secondary | ICD-10-CM | POA: Insufficient documentation

## 2022-02-01 DIAGNOSIS — Z79899 Other long term (current) drug therapy: Secondary | ICD-10-CM | POA: Insufficient documentation

## 2022-02-01 DIAGNOSIS — C7800 Secondary malignant neoplasm of unspecified lung: Secondary | ICD-10-CM | POA: Insufficient documentation

## 2022-02-01 DIAGNOSIS — C649 Malignant neoplasm of unspecified kidney, except renal pelvis: Secondary | ICD-10-CM | POA: Insufficient documentation

## 2022-02-01 LAB — COMPREHENSIVE METABOLIC PANEL
ALT: 18 U/L (ref 0–44)
AST: 29 U/L (ref 15–41)
Albumin: 3.6 g/dL (ref 3.5–5.0)
Alkaline Phosphatase: 120 U/L (ref 38–126)
Anion gap: 10 (ref 5–15)
BUN: 18 mg/dL (ref 8–23)
CO2: 24 mmol/L (ref 22–32)
Calcium: 9.9 mg/dL (ref 8.9–10.3)
Chloride: 101 mmol/L (ref 98–111)
Creatinine, Ser: 1.1 mg/dL (ref 0.61–1.24)
GFR, Estimated: >60 mL/min (ref >60)
Glucose, Bld: 108 mg/dL — ABNORMAL HIGH (ref 70–99)
Potassium: 3.7 mmol/L (ref 3.5–5.1)
Sodium: 135 mmol/L (ref 135–145)
Total Bilirubin: 0.6 mg/dL (ref 0.3–1.2)
Total Protein: 7.3 g/dL (ref 6.5–8.1)

## 2022-02-01 LAB — CBC WITH DIFFERENTIAL/PLATELET
Abs Immature Granulocytes: 0.05 10*3/uL (ref 0.00–0.07)
Basophils Absolute: 0.1 10*3/uL (ref 0.0–0.1)
Basophils Relative: 1 %
Eosinophils Absolute: 0.1 10*3/uL (ref 0.0–0.5)
Eosinophils Relative: 1 %
HCT: 40.5 % (ref 39.0–52.0)
Hemoglobin: 13.2 g/dL (ref 13.0–17.0)
Immature Granulocytes: 0 %
Lymphocytes Relative: 14 %
Lymphs Abs: 1.6 10*3/uL (ref 0.7–4.0)
MCH: 27.4 pg (ref 26.0–34.0)
MCHC: 32.6 g/dL (ref 30.0–36.0)
MCV: 84 fL (ref 80.0–100.0)
Monocytes Absolute: 1.1 10*3/uL — ABNORMAL HIGH (ref 0.1–1.0)
Monocytes Relative: 10 %
Neutro Abs: 8.4 10*3/uL — ABNORMAL HIGH (ref 1.7–7.7)
Neutrophils Relative %: 74 %
Platelets: 300 10*3/uL (ref 150–400)
RBC: 4.82 MIL/uL (ref 4.22–5.81)
RDW: 13.9 % (ref 11.5–15.5)
WBC: 11.3 10*3/uL — ABNORMAL HIGH (ref 4.0–10.5)
nRBC: 0 % (ref 0.0–0.2)

## 2022-02-01 LAB — LACTATE DEHYDROGENASE: LDH: 249 U/L — ABNORMAL HIGH (ref 98–192)

## 2022-02-01 NOTE — Patient Instructions (Addendum)
[facility] Discharge Instructions   You were seen and examined today by Dr. Delton Coombes.  He reviewed the results of your scans. The results show that cancer has spread to your lungs, lymph nodes, liver, and multiple places in your bones. Of particular concern are the spots on your lumbar spine and thoracic spine. It is concerning for cancer in your spinal cord. We will obtain MRI of both of these areas.   We will obtain blood work today to determine treatment options.      Thank you for choosing [facility] at [facility] to provide your oncology and hematology care.  To afford each patient quality time with our provider, please arrive at least 15 minutes before your scheduled appointment time.   If you have a lab appointment with the [facility] please come in thru the Main Entrance and check in at the main information desk.  You need to re-schedule your appointment should you arrive 10 or more minutes late.  We strive to give you quality time with our providers, and arriving late affects you and other patients whose appointments are after yours.  Also, if you no show three or more times for appointments you may be dismissed from the clinic at the providers discretion.     Again, thank you for choosing [facility].  Our hope is that these requests will decrease the amount of time that you wait before being seen by our physicians.       _____________________________________________________________  Should you have questions after your visit to [facility], please contact our office at [phone] and follow the prompts.  Our office hours are 8:00 a.m. and 4:30 p.m. Monday - Friday.  Please note that voicemails left after 4:00 p.m. may not be returned until the following business day.  We are closed weekends and major holidays.  You do have access to a nurse 24-7, just call the main number to the clinic  [phone] and do not press any options, hold on the line and a nurse will answer the phone.    For prescription refill requests, have your pharmacy contact our office and allow 72 hours.    Due to Covid, you will need to wear a mask upon entering the hospital. If you do not have a mask, a mask will be given to you at the Main Entrance upon arrival. For doctor visits, patients may have 1 support person age 89 or older with them. For treatment visits, patients can not have anyone with them due to social distancing guidelines and our immunocompromised population.

## 2022-02-01 NOTE — Progress Notes (Signed)
START ON PATHWAY REGIMEN - Renal Cell     A cycle is every 21 days:     Nivolumab      Ipilimumab    A cycle is every 28 days:     Nivolumab   **Always confirm dose/schedule in your pharmacy ordering system**  Patient Characteristics: Stage IV (Unresected T4M0 or Any T, M1)/Metastatic Disease, Clear Cell, First Line, Intermediate or Poor Risk Therapeutic Status: Stage IV (Unresected T4M0 or Any T, M1)/Metastatic Disease Histology: Clear Cell Line of Therapy: First Line Risk Status: Intermediate Risk Intent of Therapy: Non-Curative / Palliative Intent, Discussed with Patient

## 2022-02-02 ENCOUNTER — Ambulatory Visit: Payer: Medicare Other | Admitting: Radiation Oncology

## 2022-02-04 ENCOUNTER — Encounter (HOSPITAL_COMMUNITY): Payer: Self-pay | Admitting: Hematology

## 2022-02-04 ENCOUNTER — Encounter (HOSPITAL_COMMUNITY): Payer: Self-pay

## 2022-02-04 DIAGNOSIS — Z95828 Presence of other vascular implants and grafts: Secondary | ICD-10-CM | POA: Insufficient documentation

## 2022-02-04 HISTORY — DX: Presence of other vascular implants and grafts: Z95.828

## 2022-02-04 MED ORDER — LIDOCAINE-PRILOCAINE 2.5-2.5 % EX CREA: TOPICAL_CREAM | CUTANEOUS | 3 refills | Status: DC

## 2022-02-04 NOTE — Patient Instructions (Addendum)
Gregory [name] diagnosed with metastatic (Stage IV) renal cell carcinoma.  You will be treated in the clinic every 3 weeks with a combination of immunotherapy drugs.  Those drugs [name].  You will receive both drugs every 3 weeks for 4 cycles.  Then you will return to the clinic every 4 weeks to receive Opdivo only.  The intent of treatment is to help control this cancer, keep it from spreading further, and to alleviate any symptoms you may be having related to your disease. You will see the doctor regularly throughout treatment.  We will obtain blood work from you prior to every treatment and monitor your results to make sure it is safe to give your treatment. The doctor monitors your response to treatment by the way you [name] feeling, your blood work, and by obtaining scans periodically.  There will be wait times while you [name] here for treatment.  It will take about 30 minutes to 1 hour for your lab work to result.  Then there will be wait times while pharmacy mixes your medications.    Nivolumab (Opdivo)   About This Drug   Nivolumab is used to treat cancer. It is given in the vein (IV).   It helps your immune system work properly to target and kill cancer cells. It will take 30 minutes to infuse.     These [name] some of the most common or important side effects:    Immune Reactions    This medication stimulates your immune system. Your immune system can attack normal organs and tissues in your body, leading to serious or life threatening complications. It is important to notify your healthcare provider right away if you develop any of the following symptoms:    Diarrhea / Intestinal problems (colitis, inflammation of the bowel): Abdominal pain, diarrhea, cramping, mucus or blood in the stool, dark or tar-like stools, fever. Diarrhea means different things to different people. Any increase in your normal bowel patterns can be defined as  diarrhea and should be reported to your healthcare team.   Skin reactions: Report rash, with or without itching (pruritis), sores in your mouth, blistering or peeling skin, as these can become severe and require treatment with corticosteroids.   Lung problems (pneumonitis, inflammation of the lung): New or worsening cough, shortness of breath, trouble breathing, or chest pain.   Liver problems (hepatitis, inflammation of the liver): Yellowing of the skin or eyes, your urine appears dark or brown, pain in your abdomen, bleeding or bruising more easily than normal, or severe nausea and vomiting.    Brain and/or nerve problems: Report any headache, drooping of eyelids, double vision, trouble swallowing, weakness of arms, legs or face, or numbness or tingling in the hands or feet to your healthcare team.   Hormone abnormalities: Immune reactions can affect the pituitary, thyroid, pancreas and adrenal glands, resulting in inflammation of these glands, which can affect their production of certain hormones. Some hormone levels can be monitored with blood work. It is important that you report any changes in how you [name] feeling to your care team. Symptoms of these hormonal changes can include: headaches, nausea, vomiting, constipation, rapid heart rate, increased sweating, extreme fatigue, weakness, changes in your voice, changes in memory and concentration, increased hunger or thirst, increased urination, weight gain, hair loss, dizziness, feeling cold all the time, and changes in mood or behavior (including irritability, forgetfulness and decreased sex drive).  Eye problems: Report any changes in vision, blurry or double vision, and eye pain or redness to your healthcare team.  Kidney problems (kidney inflammation or failure): Decreased urine output, blood in the urine, swelling in the ankles, loss of appetite.   Fatigue:  Fatigue is very common during cancer treatment and is an overwhelming feeling of  exhaustion that is not usually relieved by rest. While on cancer treatment, and for a period after, you may need to adjust your schedule to manage fatigue. Plan times to rest during the day and conserve energy for more important activities. Exercise can help combat fatigue; a simple daily walk with a friend can help.  Talk to your healthcare team for helpful tips on dealing with this side effect.   Allergic Reactions:  In some cases, patients can have an allergic reaction to this medication. Signs of a reaction can include: shortness of breath or difficulty breathing, chest pain, rash, flushing or itching or a decrease in blood pressure. If you notice any changes in how you feel during the infusion, let your nurse know immediately. The infusion will be slowed or stopped if this occurs.    Less common, but important side effects can include:   Allogenic Stem Cell Transplant Reactions: Patients who receive this medication before or after having an allogenic stem cell transplant can be at an increased risk of graft vs. host disease, veno-occlusive disease and fever syndrome. You providers will monitor you closely for these side effects.    Reproductive Concerns  Exposure of an unborn child to this medication could cause birth defects, so you should not become pregnant or father a child while on this medication. Even if your menstrual cycle stops or you believe you [name] not producing sperm, effective birth control is necessary during treatment and for 5 months after stopping treatment. You should not breastfeed while taking this medication or for five months after the end of treatment.    Important Information    This drug may be present in the saliva, tears, sweat, urine, stool, vomit, semen, and vaginal secretions. Talk to your doctor and/or your nurse about the necessary precautions to take during this time.   Treating Side Effects    To decrease infection, wash your hands regularly    Avoid close  contact with people who have a cold, the flu, or other infections.    Take your temperature as your doctor or nurse tells you, and whenever you feel like you may have a fever    To help decrease bleeding, use a soft toothbrush. Check with your nurse before using dental floss.    Be very careful when using knives or tools    Use an electric shaver instead of a razor    Ask your doctor or nurse about medicines that [name] available to help stop or lessen constipation, diarrhea and/or nausea.    Drink plenty of fluids (a minimum of eight glasses per day is recommended).    If you [name] not able to move your bowels, check with your doctor or nurse before you use any enemas, laxatives, or suppositories    To help with nausea and vomiting, eat small, frequent meals instead of three large meals a day. Choose foods and drinks that [name] at room temperature. Ask your nurse or doctor about other helpful tips and medicine that is available to help or stop lessen these symptoms.    If you get diarrhea, eat low-fiber foods that [name] high in protein and  calories and avoid foods that can irritate your digestive tracts or lead to cramping. Ask your nurse or doctor about medicine that can lessen or stop your diarrhea.    To help with decreased appetite, eat small, frequent meals    Eat high caloric food such as pudding, ice cream, yogurt and milkshakes.    Manage tiredness by pacing your activities for the day. Be sure to include periods of rest between energy-draining activities    Keeping your pain under control is important to your wellbeing. Please tell your doctor or nurse if you [name] experiencing pain.    If you have diabetes, keep good control of your blood sugar level. Tell your nurse or your doctor if your glucose levels [name] higher or lower than normal    If you get a rash do not put anything on it unless your doctor or nurse says you may. Keep the area around the rash clean and dry. Ask your doctor for  medicine if your rash bothers you.    Infusion reactions may occur after your infusion. If this happens, call 911 for emergency care.   Food and Drug Interactions    There [name] no known interactions of nivolumab with food or other medications.    Tell your doctor and pharmacist about all the medicines and dietary supplements (vitamins, minerals, herbs and others) that you [name] taking at this time. The safety and use of dietary supplements and alternative diets [name] often not known. Using these might affect your cancer or interfere with your treatment. Until more is known, you should not use dietary supplements or alternative diets without your cancer doctor's help.   When to Call the Doctor   Call your doctor or nurse if you have any of these symptoms and/or any new or unusual symptoms:    Fever of 100.4 F (38 C) or higher    Chills    Easy bleeding or bruising    Wheezing or trouble breathing    Dry cough, or cough with yellow, green or bloody mucus    Confusion and/or agitation    Hallucinations    Trouble understanding or speaking    Blurry vision or changes in your eyesight    Numbness or lack of strength to your arms, legs, face, or body    Feeling dizzy or lightheaded    Loose bowel movements (diarrhea) more than 4 times a day or diarrhea with weakness or lightheadedness    Nausea that stops you from eating or drinking, and/or that is not relieved by prescribed medicines    Lasting loss of appetite or rapid weight loss of five pounds in a week    No bowel movement for 3 days or you feel uncomfortable    Bad abdominal pain, especially in upper right area    Fatigue or extreme weakness that interferes with normal activities    Decreased urine    Unusual thirst or passing urine often    Rash that is not relieved by prescribed medicines    Rash or itching    Flu-like symptoms: fever, headache, muscle and joint aches, and fatigue (low energy, feeling weak)     Signs of liver problems: dark urine, pale bowel movements, bad stomach pain, feeling very tired and weak, unusual itching, or yellowing of the eyes or skin.    Signs of infusion reaction: fever or shaking chills, flushing, facial swelling, feeling dizzy, headache, trouble breathing, rash, itching, chest tightness, or chest pain.    If  you think you may be pregnant   Reproduction Warnings    Pregnancy warning: This drug can have harmful effects on the unborn baby. Women of child bearing potential should use effective methods of birth control during your cancer treatment and for at least 5 months after treatment. Let your doctor know right away if you think you may be pregnant.    Breastfeeding warning: It is not known if this drug passes into breast milk. For this reason, women should not breast feed during treatment because this drug could enter the breast milk and cause harm to a breast feeding baby.    Fertility warning: Human fertility studies have not been done with this drug. Talk with your doctor or nurse if you plan to have children. Ask for information on sperm or egg banking.     Gregory [name]        Generic name : Ipilimumab    How Yervoy  Is Given:  Gregory [name] is given as an intravenous injection through a vein (IV). It helps your immune system work properly to target and kill cancer cells.  It will take 30 minutes to infuse.    Immune Reactions  This medication stimulates your immune system. Your immune system can attack normal organs and tissues in your body, leading to serious or life threatening complications. It is important to notify your healthcare provider right away if you develop any of the following symptoms:    Diarrhea / Intestinal problems (colitis, inflammation of the bowel): Abdominal pain, diarrhea, cramping, mucus or blood in the stool, dark or tar-like stools, fever. Diarrhea means different things to different people. Any increase in your normal bowel patterns can be  defined as diarrhea and should be reported to your healthcare team.   Bowel obstruction or perforation: Abdominal pain, fever, constipation, bloating and cramping. Any decrease in your normal bowel patterns should be reported.    Skin reactions: Report rash, with or without itching (pruritis), sores in your mouth, blistering or peeling skin, as these can become severe and require treatment with corticosteroids.   Liver problems (hepatitis, inflammation of the liver): Yellowing of the skin or eyes, your urine appears dark or brown, pain in your abdomen, bleeding or bruising more easily than normal, or severe nausea and vomiting.   Brain and/or nerve problems: Report any headache, drooping of eyelids, double vision, trouble swallowing, weakness of arms, legs or face, or numbness or tingling in the hands or feet to your healthcare team.   Eye problems: Report any changes in vision, blurry or double vision, and eye pain or redness.    Lung problems (pneumonitis, inflammation of the lung): New or worsening cough, shortness of breath, trouble breathing, or chest pain.   Kidney problems: (kidney inflammation or failure):  Decreased urine output, blood in the urine, swelling in the ankles, loss of appetite.    Hormone abnormalities: Immune reactions can affect the pituitary, thyroid, pancreas and adrenal glands, resulting in inflammation of these glands, which can affect their production of certain hormones. Some hormone levels can be monitored with blood work. It is important that you report any changes in how you [name] feeling to your care team. Symptoms of these hormonal changes can include: headaches, nausea, vomiting, constipation, rapid heart rate, increased sweating, extreme fatigue, weakness, changes in your voice, changes in memory and concentration, increased hunger or thirst, increased urination, weight gain, hair loss, dizziness, feeling cold all the time, and changes in mood or behavior (including  irritability, forgetfulness and decreased  sex drive).    Infusion-Related Side Effects: The infusion can cause a reaction that may lead to chills, fever, low blood pressure, dizziness, feeling like you [name] going to pass out and difficulty breathing. Let your nurse know right away if you [name] experiencing any changes in how you [name] feeling.    Fatigue  Fatigue is very common during cancer treatment and is an overwhelming feeling of exhaustion that is not usually relieved by rest. While on cancer treatment, and for a period after, you may need to adjust your schedule to manage fatigue. Plan times to rest during the day and conserve energy for more important activities. Exercise can help combat fatigue; a simple daily walk with a friend can help. Talk to your healthcare team for helpful tips on dealing with this side effect.    Reproductive Concerns  Exposure of an unborn child to this medication could cause birth defects, so you should not become pregnant or father a child while on this medication. Effective birth control is necessary during treatment and for 3 months after the last dose. Even if your menstrual cycle stops or you believe you [name] not producing sperm, you could still be fertile and conceive. You should not breastfeed while receiving this medication and for 3 months after the last dose.      When to Call the Doctor   Call your doctor or nurse if you have any of these symptoms and/or any new or unusual symptoms:    Fever of 100.4 F (38 C) or higher    Chills    Easy bleeding or bruising    Wheezing or trouble breathing    Dry cough, or cough with yellow, green or bloody mucus    Confusion and/or agitation    Hallucinations    Trouble understanding or speaking    Blurry vision or changes in your eyesight    Numbness or lack of strength to your arms, legs, face, or body    Feeling dizzy or lightheaded    Loose bowel movements (diarrhea) more than 4 times a day or diarrhea  with weakness or lightheadedness    Nausea that stops you from eating or drinking, and/or that is not relieved by prescribed medicines    Lasting loss of appetite or rapid weight loss of five pounds in a week    No bowel movement for 3 days or you feel uncomfortable    Bad abdominal pain, especially in upper right area    Fatigue or extreme weakness that interferes with normal activities    Decreased urine    Unusual thirst or passing urine often    Rash that is not relieved by prescribed medicines    Rash or itching    Flu-like symptoms: fever, headache, muscle and joint aches, and fatigue (low energy, feeling weak)    Signs of liver problems: dark urine, pale bowel movements, bad stomach pain, feeling very tired and weak, unusual itching, or yellowing of the eyes or skin.    Signs of infusion reaction: fever or shaking chills, flushing, facial swelling, feeling dizzy, headache, trouble breathing, rash, itching, chest tightness, or chest pain.   Precautions:  Before starting Yervoy treatment, make sure you tell your doctor about any other medications you [name] taking (including prescription, over-the-counter, vitamins, herbal remedies, etc.).  Do not receive any kind of immunization or vaccination without your doctor's approval while taking Yervoy. Inform your health care professional if you [name] pregnant or may be pregnant prior to starting  this treatment. Pregnancy category C Gregory [name] may be hazardous to the fetus. Women who [name] pregnant or become pregnant must be advised of the potential hazard to the fetus.) For both men and women: Do not conceive a child (get pregnant) while taking Yervoy. Barrier methods of contraception, such as condoms, [name] recommended. Discuss with your doctor when you may safely become pregnant or conceive a child after therapy. Do not breast feed while taking this medication.      SELF CARE ACTIVITIES WHILE ON IMMUNOTHERAPY:   Hydration Increase your fluid  intake 48 hours prior to treatment and drink at least 8 to 12 cups (64 ounces) of water/decaffeinated beverages per day after treatment. You can still have your cup of coffee or soda but these beverages do not count as part of your 8 to 12 cups that you need to drink daily. No alcohol intake.   Medications Continue taking your normal prescription medication as prescribed.  If you start any new herbal or new supplements please let us know first to make sure it is safe.   Infection Prevention Please wash your hands for at least 30 seconds using warm soapy water. Handwashing is the #1 way to prevent the spread of germs. Stay away from sick people or people who [name] getting over a cold. If you develop respiratory systems such as green/yellow mucus production or productive cough or persistent cough let us know and we will see if you need an antibiotic. It is a good idea to keep a pair of gloves on when going into grocery stores/[employer] to decrease your risk of coming into contact with germs on the carts, etc. Carry alcohol hand gel with you at all times and use it frequently if out in public. If your temperature reaches 100.4 or higher please call the clinic and let us know.  If it is after hours or on the weekend please go to the ER if your temperature is over 100.4.  Please have your own personal thermometer at home to use.     Sex and bodily fluids If you [name] going to have sex, a condom must be used to protect the person that isn't taking immunotherapy. For a few days after treatment, immunotherapy can be excreted through your bodily fluids.  When using the toilet please close the lid and flush the toilet twice.  Do this for a few day after you have had immunotherapy.    Contraception It is not known for sure whether or not immunotherapy drugs can be passed on through semen or secretions from the vagina. Because of this some doctors advise people to use a barrier method if you have sex during treatment.  This applies to vaginal, anal or oral sex.   Generally, doctors advise a barrier method only for the time you [name] actually having the treatment and for about a week after your treatment.   Advice like this can be worrying, but this does not mean that you have to avoid being intimate with your partner. You can still have close contact with your partner and continue to enjoy sex.   Animals If you have cats or birds we just ask that you not change the litter or change the cage.  Please have someone else do this for you while you [name] on immunotherapy.    Food Safety During and After Cancer Treatment Food safety is important for people both during and after cancer treatment. Cancer and cancer treatments, such as chemotherapy, radiation therapy, and stem  cell/bone marrow transplantation, often weaken the immune system. This makes it harder for your body to protect itself from foodborne illness, also called food poisoning. Foodborne illness is caused by eating food that contains harmful bacteria, parasites, or viruses.   Foods to avoid Some foods have a higher risk of becoming tainted with bacteria. These include: Unwashed fresh fruit and vegetables, especially leafy vegetables that can hide dirt and other contaminants Raw sprouts, such as alfalfa sprouts Raw or undercooked beef, especially ground beef, or other raw or undercooked meat and poultry Fatty, fried, or spicy foods immediately before or after treatment.  These can sit heavy on your stomach and make you feel nauseous. Raw or undercooked shellfish, such as oysters. Sushi and sashimi, which often contain raw fish.  Unpasteurized beverages, such as unpasteurized fruit juices, raw milk, raw yogurt, or cider Undercooked eggs, such as soft boiled, over easy, and poached; raw, unpasteurized eggs; or foods made with raw egg, such as homemade raw cookie dough and homemade mayonnaise   Simple steps for food safety   Shop smart. Do not buy food  stored or displayed in an unclean area. Do not buy bruised or damaged fruits or vegetables. Do not buy cans that have cracks, dents, or bulges. Pick up foods that can spoil at the end of your shopping trip and store them in a cooler on the way home.   Prepare and clean up foods carefully. Rinse all fresh fruits and vegetables under running water, and dry them with a clean towel or paper towel. Clean the top of cans before opening them. After preparing food, wash your hands for 20 seconds with hot water and soap. Pay special attention to areas between fingers and under nails. Clean your utensils and dishes with hot water and soap. Disinfect your kitchen and cutting boards using 1 teaspoon of liquid, unscented bleach mixed into 1 quart of water.     Dispose of old food. Eat canned and packaged food before its expiration date (the "use by" or "best before" date). Consume refrigerated leftovers within 3 to 4 days. After that time, throw out the food. Even if the food does not smell or look spoiled, it still may be unsafe. Some bacteria, such as Listeria, can grow even on foods stored in the refrigerator if they [name] kept for too long.   Take precautions when eating out. At restaurants, avoid buffets and salad bars where food sits out for a long time and comes in contact with many people. Food can become contaminated when someone with a virus, often a norovirus, or another "bug" handles it. Put any leftover food in a "to-go" container yourself, rather than having the server do it. And, refrigerate leftovers as soon as you get home. Choose restaurants that [name] clean and that [name] willing to prepare your food as you order it cooked.       Wear comfortable clothing and clothing appropriate for easy access to any Portacath or PICC line. Let us know if there is anything that we can do to make your therapy better!     What to do if you need assistance after hours or on the weekends: CALL [phone].   HOLD on the line, do not hang up.  You will hear multiple messages but at the end you will be connected with a nurse triage line.  They will contact the doctor if necessary.  Most of the time they will be able to assist you.  Do not call the  hospital operator.     I have been informed and understand all of the instructions given to me and have received a copy. I have been instructed to call the clinic [phone] or my family physician as soon as possible for continued medical care, if indicated. I do not have any more questions at this time but understand that I may call the [facility] or the Patient Navigator at [phone] during office hours should I have questions or need assistance in obtaining follow-up care.

## 2022-02-05 ENCOUNTER — Inpatient Hospital Stay (HOSPITAL_COMMUNITY): Payer: Medicare Other

## 2022-02-05 ENCOUNTER — Ambulatory Visit
Admission: RE | Admit: 2022-02-05 | Discharge: 2022-02-05 | Disposition: A | Discharge status: Home / Self Care | Payer: Medicare Other | Source: Ambulatory Visit | Attending: Radiation Oncology | Admitting: Radiation Oncology

## 2022-02-05 DIAGNOSIS — C649 Malignant neoplasm of unspecified kidney, except renal pelvis: Secondary | ICD-10-CM

## 2022-02-05 DIAGNOSIS — Z95828 Presence of other vascular implants and grafts: Secondary | ICD-10-CM

## 2022-02-05 DIAGNOSIS — C7931 Secondary malignant neoplasm of brain: Secondary | ICD-10-CM

## 2022-02-05 MED ORDER — GADOBENATE DIMEGLUMINE 529 MG/ML IV SOLN
12.0000 mL | Freq: Once | INTRAVENOUS | Status: AC | PRN
  Administered 2022-02-05: 12 mL via INTRAVENOUS

## 2022-02-05 NOTE — Progress Notes (Signed)
Immunotherapy education packet given and discussed with pt in detail.  Discussed diagnosis, staging, tx regimen, and intent of tx.  Reviewed immunotherapy medications and side effects.  Instructed on how to manage side effects at home, and when to call the clinic.  Importance of fever/chills discussed with pt and family. Discussed precautions to implement at home after receiving tx, as well as self care strategies. Phone numbers provided for clinic during regular working hours, also how to reach the clinic after hours and on weekends. Pt provided the opportunity to ask questions - all questions answered to pt's satisfaction.     

## 2022-02-06 ENCOUNTER — Ambulatory Visit: Payer: Medicare Other | Admitting: Radiation Oncology

## 2022-02-06 ENCOUNTER — Telehealth: Payer: Self-pay

## 2022-02-06 ENCOUNTER — Other Ambulatory Visit: Payer: Self-pay | Admitting: Radiation Oncology

## 2022-02-06 ENCOUNTER — Ambulatory Visit: Payer: Medicare Other

## 2022-02-06 MED ORDER — DEXAMETHASONE 4 MG PO TABS: 4.0000 mg | ORAL_TABLET | Freq: Two times a day (BID) | ORAL | 5 refills | Status: DC

## 2022-02-06 NOTE — Telephone Encounter (Signed)
Spoke with patient he wanted to reschedule this afternoon appointment due to not feeling well.  Dr. Tammi Klippel and Freeman Caldron PA-C made aware and prescription was sent to pharmacy of choice.  Patient was told this and will pick up later today.  Thanks staff and doctor for assistance in this matter.

## 2022-02-07 ENCOUNTER — Ambulatory Visit: Payer: Medicare Other | Admitting: Radiation Oncology

## 2022-02-07 ENCOUNTER — Ambulatory Visit: Payer: Medicare Other

## 2022-02-08 ENCOUNTER — Inpatient Hospital Stay (HOSPITAL_COMMUNITY): Payer: Medicare Other

## 2022-02-08 ENCOUNTER — Inpatient Hospital Stay (HOSPITAL_BASED_OUTPATIENT_CLINIC_OR_DEPARTMENT_OTHER): Payer: Medicare Other | Admitting: Hematology

## 2022-02-08 ENCOUNTER — Ambulatory Visit: Payer: Medicare Other | Admitting: Radiation Oncology

## 2022-02-08 VITALS — BP 136/86 | HR 100 | Temp 98.4°F | Resp 16 | Ht 67.0 in | Wt 128.6 lb

## 2022-02-08 VITALS — BP 121/82 | HR 77 | Temp 98.8°F | Resp 18

## 2022-02-08 DIAGNOSIS — Z5112 Encounter for antineoplastic immunotherapy: Secondary | ICD-10-CM | POA: Diagnosis not present

## 2022-02-08 DIAGNOSIS — C649 Malignant neoplasm of unspecified kidney, except renal pelvis: Secondary | ICD-10-CM | POA: Diagnosis not present

## 2022-02-08 DIAGNOSIS — C7951 Secondary malignant neoplasm of bone: Secondary | ICD-10-CM

## 2022-02-08 DIAGNOSIS — Z95828 Presence of other vascular implants and grafts: Secondary | ICD-10-CM

## 2022-02-08 LAB — CBC WITH DIFFERENTIAL/PLATELET
Abs Immature Granulocytes: 0.05 10*3/uL (ref 0.00–0.07)
Basophils Absolute: 0.1 10*3/uL (ref 0.0–0.1)
Basophils Relative: 1 %
Eosinophils Absolute: 0.2 10*3/uL (ref 0.0–0.5)
Eosinophils Relative: 1 %
HCT: 44.3 % (ref 39.0–52.0)
Hemoglobin: 14 g/dL (ref 13.0–17.0)
Immature Granulocytes: 0 %
Lymphocytes Relative: 16 %
Lymphs Abs: 1.9 10*3/uL (ref 0.7–4.0)
MCH: 27 pg (ref 26.0–34.0)
MCHC: 31.6 g/dL (ref 30.0–36.0)
MCV: 85.4 fL (ref 80.0–100.0)
Monocytes Absolute: 1.5 10*3/uL — ABNORMAL HIGH (ref 0.1–1.0)
Monocytes Relative: 13 %
Neutro Abs: 8.3 10*3/uL — ABNORMAL HIGH (ref 1.7–7.7)
Neutrophils Relative %: 69 %
Platelets: 354 10*3/uL (ref 150–400)
RBC: 5.19 MIL/uL (ref 4.22–5.81)
RDW: 13.8 % (ref 11.5–15.5)
WBC: 11.9 10*3/uL — ABNORMAL HIGH (ref 4.0–10.5)
nRBC: 0 % (ref 0.0–0.2)

## 2022-02-08 LAB — COMPREHENSIVE METABOLIC PANEL
ALT: 17 U/L (ref 0–44)
AST: 29 U/L (ref 15–41)
Albumin: 3.7 g/dL (ref 3.5–5.0)
Alkaline Phosphatase: 132 U/L — ABNORMAL HIGH (ref 38–126)
Anion gap: 11 (ref 5–15)
BUN: 18 mg/dL (ref 8–23)
CO2: 28 mmol/L (ref 22–32)
Calcium: 10.4 mg/dL — ABNORMAL HIGH (ref 8.9–10.3)
Chloride: 98 mmol/L (ref 98–111)
Creatinine, Ser: 1.08 mg/dL (ref 0.61–1.24)
GFR, Estimated: >60 mL/min (ref >60)
Glucose, Bld: 126 mg/dL — ABNORMAL HIGH (ref 70–99)
Potassium: 3.8 mmol/L (ref 3.5–5.1)
Sodium: 137 mmol/L (ref 135–145)
Total Bilirubin: 0.5 mg/dL (ref 0.3–1.2)
Total Protein: 7.6 g/dL (ref 6.5–8.1)

## 2022-02-08 LAB — TSH: TSH: 4.687 u[IU]/mL — ABNORMAL HIGH (ref 0.350–4.500)

## 2022-02-08 MED ORDER — FAMOTIDINE IN NACL 20-0.9 MG/50ML-% IV SOLN
20.0000 mg | Freq: Once | INTRAVENOUS | Status: AC
  Administered 2022-02-08: 20 mg via INTRAVENOUS
  Filled 2022-02-08: qty 50

## 2022-02-08 MED ORDER — SODIUM CHLORIDE 0.9 % IV SOLN
1.0000 mg/kg | Freq: Once | INTRAVENOUS | Status: AC
  Administered 2022-02-08: 60 mg via INTRAVENOUS
  Filled 2022-02-08: qty 12

## 2022-02-08 MED ORDER — DIPHENHYDRAMINE HCL 50 MG/ML IJ SOLN
25.0000 mg | Freq: Once | INTRAMUSCULAR | Status: AC
  Administered 2022-02-08: 25 mg via INTRAVENOUS
  Filled 2022-02-08: qty 1

## 2022-02-08 MED ORDER — SODIUM CHLORIDE 0.9% FLUSH: 10.0000 mL | INTRAVENOUS | Status: DC | PRN

## 2022-02-08 MED ORDER — SODIUM CHLORIDE 0.9 % IV SOLN
3.0700 mg/kg | Freq: Once | INTRAVENOUS | Status: AC
  Administered 2022-02-08: 180 mg via INTRAVENOUS
  Filled 2022-02-08: qty 18

## 2022-02-08 MED ORDER — SODIUM CHLORIDE 0.9 % IV SOLN: Freq: Once | INTRAVENOUS | Status: AC

## 2022-02-08 MED ORDER — ZOLEDRONIC ACID 4 MG/100ML IV SOLN
4.0000 mg | Freq: Once | INTRAVENOUS | Status: AC
  Administered 2022-02-08: 4 mg via INTRAVENOUS
  Filled 2022-02-08: qty 100

## 2022-02-08 MED ORDER — HEPARIN SOD (PORK) LOCK FLUSH 100 UNIT/ML IV SOLN: 500.0000 [IU] | Freq: Once | INTRAVENOUS | Status: DC | PRN

## 2022-02-08 NOTE — Progress Notes (Signed)
Ipilimumab (YERVOY) Patient Monitoring Assessment   Is the patient experiencing any of the following general symptoms?:  '[ ]'$ Difficulty performing normal activities '[ ]'$ Feeling sluggish or cold all the time '[ ]'$ Unusual weight gain '[ ]'$ Constant or unusual headaches '[ ]'$ Feeling dizzy or faint '[ ]'$ Changes in eyesight (blurry vision, double vision, or other vision problems) '[ ]'$ Changes in mood or behavior (ex: decreased sex drive, irritability, or forgetfulness) '[ ]'$ Starting new medications (ex: steroids, other medications that lower immune response) '[ ]'$ Patient is not experiencing any of the general symptoms above.   Gastrointestinal  Patient is having 1 bowel movement once a week. Using stool softeners.  Is this different from baseline? '[ ]'$ Yes [ x]No Are your stools watery or do they have a foul smell? '[ ]'$ Yes [ x]No Have you seen blood in your stools? '[ ]'$ Yes [ x]No Are your stools dark, tarry, or sticky? '[ ]'$ Yes [ x]No Are you having pain or tenderness in your belly? '[ ]'$ Yes [ x]No  Skin Does your skin itch? '[ ]'$ Yes [ x]No Do you have a rash? '[ ]'$ Yes [ x]No Has your skin blistered and/or peeled? '[ ]'$ Yes [ x]No Do you have sores in your mouth? '[ ]'$ Yes [ x]No  Hepatic Has your urine been dark or tea colored? '[ ]'$ Yes [ x]No Have you noticed that your skin or the whites of your eyes are turning yellow? '[ ]'$ Yes [ x]No Are you bleeding or bruising more easily than normal? '[ ]'$ Yes [ x]No Are you nauseous and/or vomiting? '[ ]'$ Yes [ x]No Do you have pain on the right side of your stomach? '[ ]'$ Yes [ x]No  Neurologic  Are you having unusual weakness of legs prn. When getting up out of the bed and first walking.  Are you having numbness or tingling in your hands or feet? '[ ]'$ Yes [ x]No  Gregory [name]   Patient tolerated therapy with no complaints voiced.  Side effects with management reviewed with understanding verbalized.  Peripheral IV site clean and dry with no bruising or swelling noted at site.  Good  blood return noted before and after administration of therapy.  Band aid applied.  Patient left in satisfactory condition with VSS and no s/s of distress noted.

## 2022-02-08 NOTE — Progress Notes (Signed)
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 There is involvement of the medial clivus, occipital condyle, petrous apex, with extension into the RIGHT cavernous sinus and sphenoid sinus...   10/21/2015 Imaging   CT abd/pelvis- Enlarged irregular enhancing mass partially exophytic from the RIGHT kidney consistent with RENAL CELL CARCINOMA. No evidence of involvement of the RIGHT renal vein.No retroperitoneal lymphadenopathy.   10/21/2015 Imaging   CT chest- No evidence thoracic metastasis. Nodule along the RIGHT oblique fissure likely represents a benign intrafissural lymph node   10/21/2015 Imaging   MRI brain- Destructive right central/posterior skullbase mass consistent with metastasis as previously described.   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, a 65  y.o. male, returns for routine follow-up and consideration for next cycle of chemotherapy. Gregory [name] was last seen on 02/01/2022.  Due for cycle #1 of Nivolumab + Ipilimumab today.   Overall, he tells me he has been feeling pretty well. He is taking 1/2 tablet of NORCO every 4-6 hours prn. His eating has improved.   Overall, he feels ready for next cycle of chemo today.    REVIEW OF SYSTEMS:  Review of Systems  Constitutional:  Positive for fatigue. Negative for appetite change.  Musculoskeletal:  Positive for arthralgias (3/10L shoulder).  Neurological:  Positive for numbness (L hand).  Psychiatric/Behavioral:  Positive for sleep disturbance. The patient is nervous/anxious.   All other systems reviewed and are negative.   PAST MEDICAL/SURGICAL HISTORY:  Past Medical History:  Diagnosis Date   Port-A-Cath in place 02/04/2022   Renal cell carcinoma ([place]) 10/23/2015   Skull lesion 10/20/2015   metastatic renal cell carcinoma   Past Surgical History:  Procedure Laterality Date   robotic radical right nephrectomy      SOCIAL HISTORY:  Social History   Socioeconomic History   Marital status: Single    Spouse name: Not on file   Number of children: Not on file   Years of education: Not on file   Highest education level: Not on file  Occupational History   Occupation: Programmer, systems: OTHER    Comment: [employer]  Tobacco Use   Smoking status: Every Day    Packs/day: 0.50    Years: 45.00    Total pack years: 22.50    Types: Cigarettes  Smokeless tobacco: Never  Vaping Use   Vaping Use: Never used  Substance and Sexual Activity   Alcohol use: Not Currently    Alcohol/week: 20.0 standard drinks of alcohol    Types: 20 Shots of liquor per week    Comment: 1-5 shot of liquor per night   Drug use: Yes    Frequency: 1.0 times per week    Comment: occasionally   Sexual activity: Yes  Other Topics Concern   Not on file  Social History Narrative   Not on file    Social Determinants of Health   Financial Resource Strain: Not on file  Food Insecurity: Not on file  Transportation Needs: Not on file  Physical Activity: Not on file  Stress: Not on file  Social Connections: Not on file  Intimate Partner Violence: Not on file    FAMILY HISTORY:  Family History  Problem Relation Age of Onset   CAD Mother    CAD Father    Cancer Paternal Uncle        stomach cancer   Cancer Paternal Uncle        unknown    CURRENT MEDICATIONS:  Current Outpatient Medications  Medication Sig Dispense Refill   Ascorbic Acid (VITAMIN C PO) Take 2 tablets by mouth daily.     Biotin 2500 MCG CAPS Take 5,000 mcg by mouth daily.     Cholecalciferol (VITAMIN D) 50 MCG (2000 UT) tablet Take 4,000 Units by mouth daily.     Cyanocobalamin (B-12) 3000 MCG CAPS Take 6,000 mcg by mouth daily.     dexamethasone (DECADRON) 4 MG tablet Take 1 tablet (4 mg total) by mouth 2 (two) times daily with a meal. 60 tablet 5   ELDERBERRY PO Take 2 capsules by mouth daily.     fentaNYL (DURAGESIC) 50 MCG/HR Place 1 patch onto the skin every 3 (three) days. (Patient not taking: Reported on 02/01/2022) 5 patch 0   HYDROcodone-acetaminophen (NORCO) 10-325 MG tablet Take 0.5-1 tablets by mouth every 4 (four) hours as needed for moderate pain or severe pain (Take 0.5 tablets for moderate pain.  Take 1 tablet for severe pain.). 60 tablet 0   Ipilimumab (YERVOY IV) Inject into the vein every 21 ( twenty-one) days. X 4 cycles     lidocaine-prilocaine (EMLA) cream Apply a small amount to port a cath site and cover with plastic wrap 1 hour prior to infusion appointments 30 g 3   Nivolumab (OPDIVO IV) Inject into the vein every 21 ( twenty-one) days.     Oxycodone HCl 10 MG TABS Take 1 tablet (10 mg total) by mouth every 4 (four) hours as needed. 84 tablet 0   senna-docusate (SENOKOT-S) 8.6-50 MG tablet Take 1 tablet by mouth 2 (two) times daily.     No current facility-administered medications  for this visit.    ALLERGIES:  No Known Allergies  PHYSICAL EXAM:  Performance status (ECOG): 1 - Symptomatic but completely ambulatory  There were no vitals filed for this visit. Wt Readings from Last 3 Encounters:  02/01/22 129 lb 6.4 oz (58.7 kg)  01/17/22 139 lb (63 kg)  01/03/22 139 lb 11.2 oz (63.4 kg)   Physical Exam Vitals reviewed.  Constitutional:      Appearance: Normal appearance.  Cardiovascular:     Rate and Rhythm: Normal rate and regular rhythm.     Pulses: Normal pulses.     Heart sounds: Normal heart sounds.  Pulmonary:     Effort: Pulmonary  effort is normal.     Breath sounds: Normal breath sounds.  Abdominal:     Palpations: Abdomen is soft. There is no mass.     Tenderness: There is no abdominal tenderness.  Neurological:     General: No focal deficit present.     Mental Status: He is alert and oriented to person, place, and time.  Psychiatric:        Mood and Affect: Mood normal.        Behavior: Behavior normal.     LABORATORY DATA:  I have reviewed the labs as listed.     Latest Ref Rng & Units 02/08/2022    8:48 AM 02/01/2022   10:09 AM 01/17/2022    8:28 AM  CBC  WBC 4.0 - 10.5 K/uL 11.9  11.3  10.0   Hemoglobin 13.0 - 17.0 g/dL 14.0  13.2  13.1   Hematocrit 39.0 - 52.0 % 44.3  40.5  40.6   Platelets 150 - 400 K/uL 354  300  316       Latest Ref Rng & Units 02/08/2022    8:48 AM 02/01/2022   10:09 AM 01/30/2022   11:06 AM  CMP  Glucose 70 - 99 mg/dL 126  108    BUN 8 - 23 mg/dL 18  18    Creatinine 0.61 - 1.24 mg/dL 1.08  1.10  1.30   Sodium 135 - 145 mmol/L 137  135    Potassium 3.5 - 5.1 mmol/L 3.8  3.7    Chloride 98 - 111 mmol/L 98  101    CO2 22 - 32 mmol/L 28  24    Calcium 8.9 - 10.3 mg/dL 10.4  9.9    Total Protein 6.5 - 8.1 g/dL 7.6  7.3    Total Bilirubin 0.3 - 1.2 mg/dL 0.5  0.6    Alkaline Phos 38 - 126 U/L 132  120    AST 15 - 41 U/L 29  29    ALT 0 - 44 U/L 17  18      DIAGNOSTIC IMAGING:  I have independently  reviewed the scans and discussed with the patient. MR Brain W Wo Contrast  Result Date: 02/06/2022 CLINICAL DATA:  Metastatic renal cell carcinoma. SRS treatment planning. EXAM: MRI HEAD WITHOUT AND WITH CONTRAST TECHNIQUE: Multiplanar, multiecho pulse sequences of the brain and surrounding structures were obtained without and with intravenous contrast. CONTRAST:  49m MULTIHANCE GADOBENATE DIMEGLUMINE 529 MG/ML IV SOLN COMPARISON:  Head MRI 01/14/2022 FINDINGS: Brain: There is no evidence of an acute infarct, midline shift, or extra-axial fluid collection. The ventricles and sulci are within normal limits for age. Chronic microhemorrhages in the right corona radiata and medial left cerebellar hemisphere are unchanged. T2 hyperintensities in the cerebral white matter bilaterally are unchanged and nonspecific but compatible with mild chronic small vessel ischemic disease. Two enhancing lesions in the right cerebellar hemisphere have mildly enlarged, now measuring 1.4 cm (series 11, image 51) and 2.0 cm (series 11, image 55) with slight worsening of mild edema but no significant mass effect. No new additional enhancing brain lesions are identified. Vascular: Major intracranial vascular flow voids are preserved. Skull and upper cervical spine: Unchanged enhancing skull base lesion in the right occipital condyle and clivus with involvement of the hypoglossal canal and carotid canal. Sinuses/Orbits: Unremarkable orbits. Clear paranasal sinuses. Trace bilateral mastoid fluid. Other: Unchanged 1.6 cm enhancing left parietal scalp mass, indeterminate. IMPRESSION: 1. Mild enlargement of two right cerebellar metastases with slight  worsening of mild edema. No additional brain metastases identified. 2. Unchanged right skull base metastasis. Electronically Signed   By: Logan Bores M.D.   On: 02/06/2022 08:50   NM Bone Scan Whole Body  Result Date: 01/30/2022 CLINICAL DATA:  Renal cell carcinoma EXAM: NUCLEAR MEDICINE  WHOLE BODY BONE SCAN TECHNIQUE: Whole body anterior and posterior images were obtained approximately 3 hours after intravenous injection of radiopharmaceutical. RADIOPHARMACEUTICALS:  20.9 mCi Technetium-80mMDP IV COMPARISON:  Same day CT. FINDINGS: Photopenia in the right renal fossa compatible with prior nephrectomy. There is left renal uptake with excretion. Heterogeneous uptake within the axial and appendicular skeleton with areas of more focal uptake including within the left scapula, bilateral ribs, and thoracolumbar spine. Additional small focus of activity within the region of the left calvarium or scalp. IMPRESSION: Findings compatible with known widespread osseous metastatic disease. Scintigraphic activity is significantly less than expected given the size and number of metastatic lesions seen by concurrently obtained CT. Electronically Signed   By: NDavina PokeD.O.   On: 01/30/2022 14:00   CT CHEST ABDOMEN PELVIS W CONTRAST  Result Date: 01/30/2022 CLINICAL DATA:  Renal cell carcinoma, staging evaluation in this 66year old male * Tracking Code: BO * EXAM: CT CHEST, ABDOMEN, AND PELVIS WITH CONTRAST TECHNIQUE: Multidetector CT imaging of the chest, abdomen and pelvis was performed following the standard protocol during bolus administration of intravenous contrast. RADIATION DOSE REDUCTION: This exam was performed according to the departmental dose-optimization program which includes automated exposure control, adjustment of the mA and/or kV according to patient size and/or use of iterative reconstruction technique. CONTRAST:  1095mOMNIPAQUE IOHEXOL 300 MG/ML  SOLN COMPARISON:  CT chest, abdomen and pelvis from 2018. FINDINGS: CT CHEST FINDINGS Cardiovascular: Normal heart size. Normal caliber of the thoracic aorta. Scattered aortic atherosclerosis. Central pulmonary arteries are unremarkable on venous phase. Mediastinum/Nodes: No thoracic inlet lymphadenopathy. No axillary lymphadenopathy. No  mediastinal lymphadenopathy. RIGHT infrahilar mass or adenopathy leading to collapse of the RIGHT lower lobe, see below. Lungs/Pleura: Pulmonary emphysema. Scattered tiny pulmonary nodules which are innumerable in both the LEFT and RIGHT chest., for example on image 119/3 there is a 5 mm LEFT lower lobe nodule with least 20 additional LEFT lower lobe nodules none greater than 1 cm. RIGHT upper lobe pulmonary nodule (image 79/3) this is the largest nodule aside from the central mass in the RIGHT chest at 9 mm size. Similar size and distribution of nodules throughout the LEFT upper lobe. Collapse of the RIGHT lower lobe with central convex margins measuring up to 5.7 x 5.6 cm,. No pleural effusion with near complete collapse of the RIGHT lower lobe. Airways in the chest are otherwise patent. Musculoskeletal: See below for full musculoskeletal details with numerous destructive lesions throughout the bony thorax and throughout the entire spine and pelvis. CT ABDOMEN PELVIS FINDINGS Hepatobiliary: Hepatic metastatic disease affecting both LEFT and RIGHT hepatic lobes. (Image 54/2) 12 mm LEFT hepatic lobe lesion. (Image 64/2) 18 mm RIGHT hepatic lobe lesion. At least 4 additional lesions in the liver. Hepatic steatosis in the background. No pericholecystic stranding. No biliary duct dilation. Portal vein is patent.  Hepatic veins are patent. Pancreas: Normal, without mass, inflammation or ductal dilatation. Spleen: Normal. Adrenals/Urinary Tract: Post RIGHT nephrectomy. Adrenal glands are normal. Lesion in the LEFT kidney, upper pole, suspicious for solid renal neoplasm (image 60/2) also seen on image 12/8 at approximately 2 cm. Additional potential solid lesion and or metastatic lesion at 7 mm (image 15/8) Third lesion  in the LEFT kidney in the lower pole measuring 2.4 cm (image 25/8). Nephrolithiasis in the lower pole with 4-5 mm calculus in this area. No ureteral calculi or hydronephrosis. Stomach/Bowel: Large volume  of stool in the rectum is well-formed, no perirectal stranding or signs of bowel obstruction. Moderate volume of stool elsewhere. No acute gastric or small bowel process. Vascular/Lymphatic: Scattered small lymph nodes throughout the retroperitoneum along the LEFT para-aortic chain largest approximately 10 mm (image 74/2) these are new since previous imaging from 2018. No additional upper abdominal or retroperitoneal lymphadenopathy. RIGHT groin adenopathy with heterogeneous enhancement (image 111/2) 18 mm short axis smaller adjacent lymph node (image 107/2) 12 mm short axis. Slight enlargement of contralateral lymph nodes but without the degree of heterogeneity seen on the RIGHT. Skin thickening of the RIGHT hemi scrotum and RIGHT groin (image 123/2) Reproductive: LEFT hydrocele incompletely evaluated, imaged on previous study. Other: No ascites.  No pneumoperitoneum. Musculoskeletal: Destruction of LEFT scapula as seen on recent MR imaging, previously biopsied. Multifocal bony metastases and intramuscular metastatic lesions. Nearly every level of the spine is involved. Numerous lytic foci throughout the spine. Lesions with notable features of posterior cortical destruction and or frank soft tissue. (Image 50/2) 19 x 17 mm lesion associated with superior endplate pathologic fracture and likely pathologic fracture of the RIGHT T12 pedicle shows soft tissue extension into the central canal. 20-30% loss of height at this level. Pathologic fracture of the superior endplate of L3 with posterior cortical displacement which is mild and associated with soft tissue density extending into the anterior central canal causing moderate central canal narrowing. RIGHT S1 lesion with frank destruction of the S1 level on the RIGHT extending in the sacral ala measuring 4.3 x 3.2 cm greatest axial dimension. Pathologic fractures of indeterminate chronicity involving T5 with approximately 10-20% loss of height and T10 with  approximately 30-40% loss of height. At T10 there is subtle soft tissue along the posterior vertebral margin. Destructive pelvic lesion (image 93/2) 2.7 cm soft tissue lesion destroying LEFT lateral iliac bone. Multifocal lytic lesions in the ribs are mainly small. Expansile component of rib destruction of the RIGHT posterior sixth rib and RIGHT 6 transverse process with a small amount of extrapleural soft tissue (image 23/2) this measures 2 cm. Sternal manubrial lesion measuring 15 mm with measurable soft tissue and sternal destruction. Scattered lytic foci in the RIGHT scapula. Other small scattered lytic pelvic lesions. Heterogeneous appearance of marrow space of RIGHT and LEFT proximal femur also raising the question of early metastatic disease to this area, endosteal scalloping seen on image 120/2 as an example. Intramuscular lesions of LEFT gluteal musculature (image 105/2 and image 96/2. RIGHT erector spinae musculature with involvement as well. Additional focus along the chest wall/upper abdomen in the upper rectus muscle (image 13/4). IMPRESSION: Signs of extensive bony metastatic disease with areas of pathologic fracture in the spine which are age indeterminate but new compared to previous imaging. Posterior cortical disruption and central canal extension greatest at T12 and L3 as discussed. Spinal MRI could be considered for further evaluation as clinically warranted. Additional involvement of pelvis, ribs and sternum as well as bilateral scapulae Pulmonary metastatic, nodal and hepatic metastatic disease. Contralateral, LEFT renal lesions either metastatic or new primary renal cell carcinoma with signs of prior RIGHT nephrectomy. Intramuscular metastatic lesions. Distended rectum full of formed stool, correlate with any symptoms of fecal impaction. No signs of obstruction currently. These results will be called to the ordering clinician or representative by  the Psychologist, clinical, and communication  documented in the PACS or [employer]. Electronically Signed   By: Zetta Bills M.D.   On: 01/30/2022 12:13   CT BIOPSY  Result Date: 01/17/2022 INDICATION: 66 year old male referred for biopsy of destructive left shoulder mass, anticipating RCC metastasis EXAM: CT BIOPSY MEDICATIONS: None. ANESTHESIA/SEDATION: Moderate (conscious) sedation was employed during this procedure. A total of Versed 2.0 mg and Fentanyl 150 mcg was administered intravenously. Moderate Sedation Time: 20 minutes. The patient's level of consciousness and vital signs were monitored continuously by radiology nursing throughout the procedure under my direct supervision. FLUOROSCOPY TIME:  CT COMPLICATIONS: None PROCEDURE: Informed written consent was obtained from the patient after a thorough discussion of the procedural risks, benefits and alternatives. All questions were addressed. Maximal Sterile Barrier Technique was utilized including caps, mask, sterile gowns, sterile gloves, sterile drape, hand hygiene and skin antiseptic. A timeout was performed prior to the initiation of the procedure. Patient positioned right decubitus on the TT gantry table. Scout CT acquired for planning purposes. The patient is prepped and draped in the usual sterile fashion. 1% lidocaine was used for local anesthesia. Using CT guidance, introducer needle was advanced into the destructive soft tissue lesion involving the scapula. Once we confirmed needle tip position multiple 18 gauge core biopsy were acquired. Specimen was placed into formalin solution. Gel-Foam slurry was administered on withdrawal of the needle. Final images were stored. Patient tolerated procedure well and remained hemodynamically stable throughout. No complications were encountered and no significant blood loss. IMPRESSION: Status post CT-guided biopsy of destructive left scapular mass. Signed, Dulcy Fanny. Dellia Nims, RPVI [facility]  Radiology Electronically Signed   By: Corrie Mckusick D.O.   On: 01/17/2022 11:28   MR Brain W Wo Contrast  Result Date: 01/16/2022 CLINICAL DATA:  Metastatic renal cell carcinoma EXAM: MRI HEAD WITHOUT AND WITH CONTRAST TECHNIQUE: Multiplanar, multiecho pulse sequences of the brain and surrounding structures were obtained without and with intravenous contrast. CONTRAST:  43m MULTIHANCE GADOBENATE DIMEGLUMINE 529 MG/ML IV SOLN COMPARISON:  Brain MRI 10/27/2020 FINDINGS: Brain: There is a 1.1 cm AP x 1.1 cm TV peripherally enhancing lesion in the anterior aspect of the right cerebellar hemisphere abutting the middle cerebellar peduncle with mild surrounding edema. There is a 1.5 cm by 0.9 cm enhancing lesion more posterolaterally in the right cerebellar hemisphere with mild surrounding edema. There is no significant mass effect or effacement of fourth ventricle. No other parenchymal metastases are seen. There is no acute intracranial hemorrhage, extra-axial fluid collection, or acute infarct. Background parenchymal volume is normal. The ventricles are normal in size. Gray-white differentiation is preserved. Punctate chronic microhemorrhages in the right corona radiata and left cerebellar hemisphere are noted, nonspecific. There is no midline shift Vascular: Normal flow voids. Skull and upper cervical spine: The enhancing lesion centered in the right clivus extending inferolaterally to the right occipital condyle is again seen. The lesion again encases the internal carotid artery (14-44). Sinuses/Orbits: The paranasal sinuses are clear. The globes and orbits are unremarkable. Other: The enhancing lesion in the left parietal scalp has slightly increased in size. There is no evidence of invasion of the underlying calvarium. IMPRESSION: 1. 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 2. No significant interval change in size or appearance of the  right skull base metastatic lesion. 3. Increased size of the left parietal scalp lesion, again favored benign. Recommend correlation with  physical exam. Electronically Signed   By: Valetta Mole M.D.   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  PT1b PNx. - He was followed by Dr. Alen Blew, last seen on 05/08/2017. - He reported left shoulder pain for the last couple of years. - He was evaluated by Dr. Chauncey Reading and then will and MRI was ordered. - Left shoulder MRI (12/23/2021): Large heterogeneous mass lesion with soft tissue extension emanating from scapula.  Multiple rib lesions and lesion in the humerus. - He was evaluated by Dr. Tammi Klippel on 12/29/2021 and was recommended palliative XRT. - CT CAP (01/30/2022): Extensive bony metastatic disease, posterior cortical disruption and central canal extension greatest at T12 and L3.  Small pulmonary nodules bilaterally.  Hepatic metastatic disease.  Left renal lesions either metastatic or new primary. - Bone scan (01/30/2022): Findings consistent with widespread bony metastatic disease. - IMDC intermediate risk group - Ipilimumab and nivolumab cycle 1 started on 02/08/2022.    Social/family history: - He is seen today with his girlfriend.  He worked as Clinical biochemist for 46 years.  He is a current active smoker, currently smoking half pack per day.  Previously smoked up to 2 packs/day for 50 years. - Mother had throat cancer.  2 paternal uncles and 1 paternal aunt had cancers.   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  No additional brain  mets. - He will have SRS of brain lesions on 02/12/2022. -I have recommended MRI of the thoracic and lumbar spine. - I have reviewed labs today which showed mild hypercalcemia with 10.4 calcium.  Alman is 3.7.  CBC was grossly normal.  LFTs are normal.  Alk phos was mildly elevated.  TSH was 4.6. - We talked about initiating immunotherapy with Opdivo and their wife.  We discussed side effects in detail. - He will also receive Zometa 4 mg IV today. - RTC 3 weeks for follow-up with repeat labs and cycle 2.   2.  Left shoulder pain: -Continue oxycodone 10 mg every 4-6 hours as needed. - She will also have XRT to the left shoulder by Dr. Tammi Klippel for pain control. - Recommend stool softener or MiraLAX as needed for constipation.  3.  Weight loss: -Part of the reason for weight loss is poor control of pain. - Continue to drink 2 cans of Premier protein per day.  4.  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   Orders placed this encounter:  No orders of the defined types were placed in this encounter.    Derek Jack, MD [place] [phone]   I, Thana Ates, am acting as a scribe for Dr. Derek Jack.  I, Derek Jack MD, have reviewed the above documentation for accuracy and completeness, and I agree with the above.

## 2022-02-08 NOTE — Patient Instructions (Signed)
[place] at [facility] Discharge Instructions   You were seen and examined today by Dr. Delton Coombes.  He reviewed the results of your lab work which is normal/stable, with exception of your calcium. It is high. This is coming from the cancer in your bones. We will give you a medication called Zometa to help decrease the calcium in your blood stream.   We will proceed with your treatment today.   Return as scheduled.    Thank you for choosing [place] at [place] to provide your oncology and hematology care.  To afford each patient quality time with our provider, please arrive at least 15 minutes before your scheduled appointment time.   If you have a lab appointment with the [facility] please come in thru the Main Entrance and check in at the main information desk.  You need to re-schedule your appointment should you arrive 10 or more minutes late.  We strive to give you quality time with our providers, and arriving late affects you and other patients whose appointments are after yours.  Also, if you no show three or more times for appointments you may be dismissed from the clinic at the providers discretion.     Again, thank you for choosing [facility].  Our hope is that these requests will decrease the amount of time that you wait before being seen by our physicians.       _____________________________________________________________  Should you have questions after your visit to [facility], please contact our office at [phone] and follow the prompts.  Our office hours are 8:00 a.m. and 4:30 p.m. Monday - Friday.  Please note that voicemails left after 4:00 p.m. may not be returned until the following business day.  We are closed weekends and major holidays.  You do have access to a nurse 24-7, just call the main number to the clinic [phone] and do not press any options, hold on the line and a nurse  will answer the phone.    For prescription refill requests, have your pharmacy contact our office and allow 72 hours.    Due to Covid, you will need to wear a mask upon entering the hospital. If you do not have a mask, a mask will be given to you at the Main Entrance upon arrival. For doctor visits, patients may have 1 support person age 66 or older with them. For treatment visits, patients can not have anyone with them due to social distancing guidelines and our immunocompromised population.

## 2022-02-08 NOTE — Progress Notes (Signed)
Patient has been examined by Dr. Delton Coombes, and vital signs and labs have been reviewed. ANC, Creatinine, LFTs, hemoglobin, and platelets are within treatment parameters per M.D. - pt may proceed with treatment.  We will also give Zometa for calcium of 10.4.

## 2022-02-08 NOTE — Patient Instructions (Signed)
[facility]  Discharge Instructions: Thank you for choosing [facility] to provide your oncology and hematology care.  If you have a lab appointment with the [facility], please come in thru the Main Entrance and check in at the main information desk.  Wear comfortable clothing and clothing appropriate for easy access to any Portacath or PICC line.   We strive to give you quality time with your provider. You may need to reschedule your appointment if you arrive late (15 or more minutes).  Arriving late affects you and other patients whose appointments are after yours.  Also, if you miss three or more appointments without notifying the office, you may be dismissed from the clinic at the provider's discretion.      For prescription refill requests, have your pharmacy contact our office and allow 72 hours for refills to be completed.    Today you received the following chemotherapy and/or immunotherapy agents opdivo and yerovoy.  Ipilimumab injection What is this medication? IPILIMUMAB (IP i LIM ue mab) is a monoclonal antibody. It treats colorectal cancer, esophageal cancer, kidney cancer, liver cancer, lung cancer, melanoma, and mesothelioma. This medicine may be used for other purposes; ask your health care provider or pharmacist if you have questions. COMMON BRAND NAME(S): YERVOY What should I tell my care team before I take this medication? They need to know if you have any of these conditions: autoimmune diseases like Crohn's disease, ulcerative colitis, or lupus have had or planning to have an allogeneic stem cell transplant (uses someone else's stem cells) history of organ transplant nervous system problems like myasthenia gravis or Guillain-Barre syndrome an unusual or allergic reaction to ipilimumab, other medicines, foods, dyes, or preservatives pregnant or trying to get pregnant breast-feeding How should I use this medication? This medicine is for  infusion into a vein. It is given by a health care professional in a hospital or clinic setting. A special MedGuide will be given to you before each treatment. Be sure to read this information carefully each time. Talk to your pediatrician regarding the use of this medicine in children. While this drug may be prescribed for children as young as 12 years for selected conditions, precautions do apply. Overdosage: If you think you have taken too much of this medicine contact a poison control center or emergency room at once. NOTE: This medicine is only for you. Do not share this medicine with others. What if I miss a dose? It is important not to miss your dose. Call your doctor or health care professional if you are unable to keep an appointment. What may interact with this medication? Interactions are not expected. This list may not describe all possible interactions. Give your health care provider a list of all the medicines, herbs, non-prescription drugs, or dietary supplements you use. Also tell them if you smoke, drink alcohol, or use illegal drugs. Some items may interact with your medicine. What should I watch for while using this medication? Tell your doctor or healthcare professional if your symptoms do not start to get better or if they get worse. Do not become pregnant while taking this medicine or for 3 months after stopping it. Women should inform their doctor if they wish to become pregnant or think they might be pregnant. There is a potential for serious side effects to an unborn child. Talk to your health care professional or pharmacist for more information. Do not breast-feed an infant while taking this medicine or for 3  months after the last dose. Your condition will be monitored carefully while you are receiving this medicine. You may need blood work done while you are taking this medicine. What side effects may I notice from receiving this medication? Side effects that you should  report to your doctor or health care professional as soon as possible: allergic reactions like skin rash, itching or hives, swelling of the face, lips, or tongue black, tarry stools bloody or watery diarrhea changes in vision dizziness eye pain fast, irregular heartbeat feeling anxious feeling faint or lightheaded, falls nausea, vomiting pain, tingling, numbness in the hands or feet redness, blistering, peeling or loosening of the skin, including inside the mouth signs and symptoms of liver injury like dark yellow or brown urine; general ill feeling or flu-like symptoms; light-colored stools; loss of appetite; nausea; right upper belly pain; unusually weak or tired; yellowing of the eyes or skin unusual bleeding or bruising Side effects that usually do not require medical attention (report to your doctor or health care professional if they continue or are bothersome): headache loss of appetite trouble sleeping This list may not describe all possible side effects. Call your doctor for medical advice about side effects. You may report side effects to FDA at 1-800-FDA-1088. Where should I keep my medication? This drug is given in a hospital or clinic and will not be stored at home. NOTE: This sheet is a summary. It may not cover all possible information. If you have questions about this medicine, talk to your doctor, pharmacist, or health care provider.  2023 Elsevier/Gold Standard (2021-07-21 00:00:00) Nivolumab injection What is this medication? NIVOLUMAB (nye VOL ue mab) is a monoclonal antibody. It treats certain types of cancer. Some of the cancers treated are colon cancer, head and neck cancer, Hodgkin lymphoma, lung cancer, and melanoma. This medicine may be used for other purposes; ask your health care provider or pharmacist if you have questions. COMMON BRAND NAME(S): Opdivo What should I tell my care team before I take this medication? They need to know if you have any of these  conditions: Autoimmune diseases such as Crohn's disease, ulcerative colitis, or lupus Have had or planning to have an allogeneic stem cell transplant (uses someone else's stem cells) History of chest radiation Organ transplant Nervous system problems such as myasthenia gravis or Guillain-Barre syndrome An unusual or allergic reaction to nivolumab, other medicines, foods, dyes, or preservatives Pregnant or trying to get pregnant Breast-feeding How should I use this medication? This medication is injected into a vein. It is given in a hospital or clinic setting. A special MedGuide will be given to you before each treatment. Be sure to read this information carefully each time. Talk to your care team regarding the use of this medication in children. While it may be prescribed for children as young as 12 years for selected conditions, precautions do apply. Overdosage: If you think you have taken too much of this medicine contact a poison control center or emergency room at once. NOTE: This medicine is only for you. Do not share this medicine with others. What if I miss a dose? Keep appointments for follow-up doses. It is important not to miss your dose. Call your care team if you are unable to keep an appointment. What may interact with this medication? Interactions have not been studied. This list may not describe all possible interactions. Give your health care provider a list of all the medicines, herbs, non-prescription drugs, or dietary supplements you use. Also tell  them if you smoke, drink alcohol, or use illegal drugs. Some items may interact with your medicine. What should I watch for while using this medication? Your condition will be monitored carefully while you are receiving this medication. You may need blood work done while you are taking this medication. Do not become pregnant while taking this medication or for 5 months after stopping it. Women should inform their care team if  they wish to become pregnant or think they might be pregnant. There is a potential for serious harm to an unborn child. Talk to your care team for more information. Do not breast-feed an infant while taking this medication or for 5 months after stopping it. What side effects may I notice from receiving this medication? Side effects that you should report to your care team as soon as possible: Allergic reactions--skin rash, itching, hives, swelling of the face, lips, tongue, or throat Bloody or black, tar-like stools Change in vision Chest pain Diarrhea Dry cough, shortness of breath or trouble breathing Eye pain Fast or irregular heartbeat Fever, chills High blood sugar (hyperglycemia)--increased thirst or amount of urine, unusual weakness or fatigue, blurry vision High thyroid levels (hyperthyroidism)--fast or irregular heartbeat, weight loss, excessive sweating or sensitivity to heat, tremors or shaking, anxiety, nervousness, irregular menstrual cycle or spotting Kidney injury--decrease in the amount of urine, swelling of the ankles, hands, or feet Liver injury--right upper belly pain, loss of appetite, nausea, light-colored stool, dark yellow or brown urine, yellowing skin or eyes, unusual weakness or fatigue Low red blood cell count--unusual weakness or fatigue, dizziness, headache, trouble breathing Low thyroid levels (hypothyroidism)--unusual weakness or fatigue, increased sensitivity to cold, constipation, hair loss, dry skin, weight gain, feelings of depression Mood and behavior changes-confusion, change in sex drive or performance, irritability Muscle pain or cramps Pain, tingling, or numbness in the hands or feet, muscle weakness, trouble walking, loss of balance or coordination Red or dark brown urine Redness, blistering, peeling, or loosening of the skin, including inside the mouth Stomach pain Unusual bruising or bleeding Side effects that usually do not require medical  attention (report to your care team if they continue or are bothersome): Bone pain Constipation Loss of appetite Nausea Tiredness Vomiting This list may not describe all possible side effects. Call your doctor for medical advice about side effects. You may report side effects to FDA at 1-800-FDA-1088. Where should I keep my medication? This medication is given in a hospital or clinic and will not be stored at home. NOTE: This sheet is a summary. It may not cover all possible information. If you have questions about this medicine, talk to your doctor, pharmacist, or health care provider.  2023 Elsevier/Gold Standard (2021-07-21 00:00:00)       To help prevent nausea and vomiting after your treatment, we encourage you to take your nausea medication as directed.  BELOW ARE SYMPTOMS THAT SHOULD BE REPORTED IMMEDIATELY: *FEVER GREATER THAN 100.4 F (38 C) OR HIGHER *CHILLS OR SWEATING *NAUSEA AND VOMITING THAT IS NOT CONTROLLED WITH YOUR NAUSEA MEDICATION *UNUSUAL SHORTNESS OF BREATH *UNUSUAL BRUISING OR BLEEDING *URINARY PROBLEMS (pain or burning when urinating, or frequent urination) *BOWEL PROBLEMS (unusual diarrhea, constipation, pain near the anus) TENDERNESS IN MOUTH AND THROAT WITH OR WITHOUT PRESENCE OF ULCERS (sore throat, sores in mouth, or a toothache) UNUSUAL RASH, SWELLING OR PAIN  UNUSUAL VAGINAL DISCHARGE OR ITCHING   Items with * indicate a potential emergency and should be followed up as soon as possible or go to the  Emergency Department if any problems should occur.  Please show the CHEMOTHERAPY ALERT CARD or IMMUNOTHERAPY ALERT CARD at check-in to the Emergency Department and triage nurse.  Should you have questions after your visit or need to cancel or reschedule your appointment, please contact [facility] [phone]  and follow the prompts.  Office hours are 8:00 a.m. to 4:30 p.m. Monday - Friday. Please note that voicemails left after 4:00 p.m. may  not be returned until the following business day.  We are closed weekends and major holidays. You have access to a nurse at all times for urgent questions. Please call the main number to the clinic [phone] and follow the prompts.  For any non-urgent questions, you may also contact your provider using MyChart. We now offer e-Visits for anyone 72 and older to request care online for non-urgent symptoms. For details visit [email or web address].   Also download the MyChart app! Go to the app store, search "MyChart", open the app, select [facility], and log in with your MyChart username and password.  Due to Covid, a mask is required upon entering the hospital/clinic. If you do not have a mask, one will be given to you upon arrival. For doctor visits, patients may have 1 support person aged 30 or older with them. For treatment visits, patients cannot have anyone with them due to current Covid guidelines and our immunocompromised population.

## 2022-02-09 ENCOUNTER — Ambulatory Visit: Payer: Medicare Other | Admitting: Radiation Oncology

## 2022-02-11 ENCOUNTER — Other Ambulatory Visit: Payer: Medicare Other

## 2022-02-12 ENCOUNTER — Ambulatory Visit: Payer: Medicare Other | Admitting: Radiation Oncology

## 2022-02-13 ENCOUNTER — Other Ambulatory Visit: Payer: Self-pay

## 2022-02-13 ENCOUNTER — Ambulatory Visit
Admission: RE | Admit: 2022-02-13 | Discharge: 2022-02-13 | Disposition: A | Discharge status: Home / Self Care | Payer: Medicare Other | Source: Ambulatory Visit | Attending: Radiation Oncology | Admitting: Radiation Oncology

## 2022-02-13 ENCOUNTER — Telehealth (HOSPITAL_COMMUNITY): Payer: Self-pay

## 2022-02-13 DIAGNOSIS — Z510 Encounter for antineoplastic radiation therapy: Secondary | ICD-10-CM | POA: Insufficient documentation

## 2022-02-13 DIAGNOSIS — C7931 Secondary malignant neoplasm of brain: Secondary | ICD-10-CM | POA: Diagnosis present

## 2022-02-13 DIAGNOSIS — C649 Malignant neoplasm of unspecified kidney, except renal pelvis: Secondary | ICD-10-CM

## 2022-02-13 DIAGNOSIS — C61 Malignant neoplasm of prostate: Secondary | ICD-10-CM

## 2022-02-13 DIAGNOSIS — C7951 Secondary malignant neoplasm of bone: Secondary | ICD-10-CM | POA: Insufficient documentation

## 2022-02-13 MED ORDER — SODIUM CHLORIDE 0.9% FLUSH
10.0000 mL | INTRAVENOUS | Status: DC | PRN
  Administered 2022-02-13: 10 mL via INTRAVENOUS

## 2022-02-13 MED ORDER — LORAZEPAM 1 MG PO TABS
1.0000 mg | ORAL_TABLET | Freq: Once | ORAL | Status: AC
  Administered 2022-02-13: 1 mg via ORAL
  Filled 2022-02-13: qty 1

## 2022-02-13 NOTE — Telephone Encounter (Signed)
Late entry.  Spoke with the patient for treatment follow up.  No complaints voiced.  Patient stated he felt fine. Denied rash, itching, SOB, or any other complaints.  Reminded of telephone triage and when to report to the ER with understanding verbalized.

## 2022-02-13 NOTE — Progress Notes (Signed)
IV removed intact in CT SIM.

## 2022-02-13 NOTE — Addendum Note (Signed)
Encounter addended by: Tyler Pita, MD on: 02/13/2022 2:41 PM  Actions taken: Problem List reviewed, Medication List reviewed, Allergies reviewed

## 2022-02-13 NOTE — Addendum Note (Signed)
Encounter addended by: Mollie Germany, LPN on: 03/03/4102 0:13 PM  Actions taken: LDA properties accepted, Clinical Note Signed

## 2022-02-13 NOTE — Progress Notes (Signed)
Has armband been applied?  Yes.    Does patient have an allergy to IV contrast dye?: No.   Has patient ever received premedication for IV contrast dye?: Yes.   Ativan  Does patient take metformin?: No.  If patient does take metformin when was the last dose: N/A  Date of lab work: February 13, 2022 BUN: 18 CR: 1.08  IV site: forearm right, condition patent and no redness  Has IV site been added to flowsheet?  Yes.    There were no vitals taken for this visit.

## 2022-02-13 NOTE — Addendum Note (Signed)
Encounter addended by: Mollie Germany, LPN on: 4/62/1948 1:25 PM  Actions taken: Order list changed, Diagnosis association updated

## 2022-02-14 ENCOUNTER — Inpatient Hospital Stay (HOSPITAL_COMMUNITY): Payer: Medicare Other | Admitting: Licensed Clinical Social Worker

## 2022-02-14 ENCOUNTER — Ambulatory Visit: Payer: Medicare Other

## 2022-02-14 ENCOUNTER — Encounter (HOSPITAL_COMMUNITY): Payer: Self-pay | Admitting: Licensed Clinical Social Worker

## 2022-02-14 DIAGNOSIS — C649 Malignant neoplasm of unspecified kidney, except renal pelvis: Secondary | ICD-10-CM

## 2022-02-14 NOTE — Progress Notes (Signed)
  Radiation Oncology         [phone]) [phone] ________________________________  Name: Gregory [name]. MRN: [id number]  Date: 02/13/2022  DOB: 03-02-1956  SIMULATION AND TREATMENT PLANNING NOTE    ICD-10-CM   1. 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  NARRATIVE:  The patient was brought to the [facility].  Identity was confirmed.  All relevant records and images related to the planned course of therapy were reviewed.  The patient freely provided informed written consent to proceed with treatment after reviewing the details related to the planned course of therapy. The consent form was witnessed and verified by the simulation staff. Intravenous access was established for contrast administration. Then, the patient was set-up in a stable reproducible supine position for radiation therapy.  A relocatable thermoplastic stereotactic head frame was fabricated for precise immobilization.  CT images were obtained.  Surface markings were placed.  The CT images were loaded into the planning software and fused with the patient's targeting MRI scan.  Then the target and avoidance structures were contoured.  Treatment planning then occurred.  The radiation prescription was entered and confirmed.  I have requested 3D planning  I have requested a DVH of the following structures: Brain stem, brain, left eye, right eye, lenses, optic chiasm, target volumes, uninvolved brain, and normal tissue.    SPECIAL TREATMENT PROCEDURE:  The planned course of therapy using radiation constitutes a special treatment procedure. Special care is required in the management of this patient for the following reasons. This treatment  constitutes a Special Treatment Procedure for the following reason: High dose per fraction requiring special monitoring for increased toxicities of treatment including daily imaging.  The special nature of the planned course of radiotherapy will require increased physician supervision and oversight to ensure patient's safety with optimal treatment outcomes.  This requires extended time and effort.  PLAN:  The patient will receive 20 Gy in 1 fraction.  ________________________________  Sheral Apley Tammi Klippel, M.D.

## 2022-02-14 NOTE — Progress Notes (Signed)
[facility] Work  Initial Assessment   Gregory [name]. is a 66 y.o. year old male contacted by phone. Clinical Social Work was referred by medical provider for assessment of psychosocial needs.   SDOH (Social Determinants of Health) assessments performed: Yes   SDOH Screenings   Alcohol Screen: Not on file  Depression (LTJ0-3): Not on file (08/04/2017)  Financial Resource Strain: Not on file  Food Insecurity: Not on file  Housing: Not on file  Physical Activity: Not on file  Social Connections: Not on file  Stress: Not on file  Tobacco Use: High Risk (02/04/2022)   Patient History    Smoking Tobacco Use: Every Day    Smokeless Tobacco Use: Never    Passive Exposure: Not on file  Transportation Needs: Not on file     Distress Screen completed: No    12/28/2021    3:20 PM  ONCBCN DISTRESS SCREENING  Distress experienced in past week (1-10) 8  Emotional problem type Nervousness/Anxiety;Adjusting to illness      Family/Social Information:  Housing Arrangement: patient lives with his girlfriend Gregory [name] [phone].   Pt has a home in [place], but is staying with Gregory [name] while going through treatment. Gregory [name] resides in [place], [place]. Family members/support persons in your life? Pt's girlfriend is very supportive and pt has an adult daughter who is also very involved. Transportation concerns: At present there are no transportation; however, pt lives a significant distance from treatment and pain has been a significant issue.  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  Income source: Paediatric nurse concerns: No Type of concern: None Food access concerns: no Religious or spiritual practice: No Services Currently in place:  none  Coping/ Adjustment to diagnosis: Patient understands treatment plan and what happens next? yes Concerns about diagnosis and/or treatment: Quality of life Patient  reported stressors:  At present pt is experiencing a significant amount of pain and must spend most of the day lying down as the pain is too intense sitting and standing. Hopes and/or priorities: Pt's priority is to move forward w/ treatment w/ the hope of treatment improving his quality of life.  Patient enjoys  not addressed Current coping skills/ strengths: Supportive family/friends     SUMMARY: Current SDOH Barriers:  Distance from treatment and pain management  Clinical Social Work Clinical Goal(s):  No clinical social work goals at this time  Interventions: Discussed common feeling and emotions when being diagnosed with cancer, and the importance of support during treatment Informed patient of the support team roles and support services at [facility] Provided [facility] contact information and encouraged patient to call with any questions or concerns Provided patient with information about supportive services.   Follow Up Plan: Patient will contact CSW with any support or resource needs Patient verbalizes understanding of plan: Yes    Henriette Combs, LCSW

## 2022-02-14 NOTE — Progress Notes (Signed)
See initial assessment from earlier today.

## 2022-02-14 NOTE — Addendum Note (Signed)
Encounter addended by: Tyler Pita, MD on: 02/14/2022 4:18 PM  Actions taken: Problem List reviewed, Medication List reviewed, Allergies reviewed, Clinical Note Signed

## 2022-02-14 NOTE — Progress Notes (Signed)
  Radiation Oncology         [phone]) [phone] ________________________________  Name: [name]. MRN: [id number]  Date: 02/13/2022  DOB: [birth year]  SIMULATION AND TREATMENT PLANNING NOTE    ICD-10-CM   1. 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)  C79.51    C64.9       DIAGNOSIS:  66 yo man with painful left scapular metastases secondary to metastatic renal cell carcinoma.  NARRATIVE:  The patient was brought to the [facility].  Identity was confirmed.  All relevant records and images related to the planned course of therapy were reviewed.  The patient freely provided informed written consent to proceed with treatment after reviewing the details related to the planned course of therapy. The consent form was witnessed and verified by the simulation staff.  Then, the patient was set-up in a stable reproducible  supine position for radiation therapy.  CT images were obtained.  Surface markings were placed.  The CT images were loaded into the planning software.  Then the target and avoidance structures were contoured.  Treatment planning then occurred.  The radiation prescription was entered and confirmed.  Then, I designed and supervised the construction of a total of multiple medically necessary complex treatment devices defined in the approved plan in [facility].  I have requested : 3D Simulation  I have requested a DVH of the following structures: lungs, spinal cord and target.   PLAN:  The patient will receive 30 Gy in 10 fractions.  ________________________________  Sheral Apley Tammi Klippel, M.D.

## 2022-02-15 ENCOUNTER — Ambulatory Visit: Payer: Medicare Other

## 2022-02-15 ENCOUNTER — Inpatient Hospital Stay: Admission: RE | Admit: 2022-02-15 | Payer: Medicare Other | Source: Ambulatory Visit

## 2022-02-15 ENCOUNTER — Other Ambulatory Visit: Payer: Self-pay | Admitting: Hematology

## 2022-02-15 ENCOUNTER — Other Ambulatory Visit: Payer: Medicare Other

## 2022-02-15 ENCOUNTER — Other Ambulatory Visit: Payer: Self-pay | Admitting: Radiation Oncology

## 2022-02-15 DIAGNOSIS — Z510 Encounter for antineoplastic radiation therapy: Secondary | ICD-10-CM | POA: Diagnosis not present

## 2022-02-15 DIAGNOSIS — C649 Malignant neoplasm of unspecified kidney, except renal pelvis: Secondary | ICD-10-CM

## 2022-02-15 MED ORDER — LORAZEPAM 1 MG PO TABS: 1.0000 mg | ORAL_TABLET | Freq: Three times a day (TID) | ORAL | 0 refills | Status: DC | PRN

## 2022-02-16 ENCOUNTER — Ambulatory Visit
Admission: RE | Admit: 2022-02-16 | Discharge: 2022-02-16 | Disposition: A | Discharge status: Home / Self Care | Payer: Medicare Other | Source: Ambulatory Visit | Attending: Radiation Oncology | Admitting: Radiation Oncology

## 2022-02-16 ENCOUNTER — Other Ambulatory Visit: Payer: Self-pay

## 2022-02-16 ENCOUNTER — Ambulatory Visit: Payer: Medicare Other

## 2022-02-16 ENCOUNTER — Telehealth: Payer: Self-pay | Admitting: Radiation Therapy

## 2022-02-16 ENCOUNTER — Other Ambulatory Visit: Payer: Self-pay | Admitting: Radiation Therapy

## 2022-02-16 ENCOUNTER — Encounter: Payer: Self-pay | Admitting: Urology

## 2022-02-16 DIAGNOSIS — C649 Malignant neoplasm of unspecified kidney, except renal pelvis: Secondary | ICD-10-CM

## 2022-02-16 DIAGNOSIS — Z510 Encounter for antineoplastic radiation therapy: Secondary | ICD-10-CM | POA: Diagnosis not present

## 2022-02-16 LAB — RAD ONC ARIA SESSION SUMMARY
Course Elapsed Days: 0
Plan Fractions Treated to Date: 1
Plan Prescribed Dose Per Fraction: 20 Gy
Plan Total Fractions Prescribed: 1
Plan Total Prescribed Dose: 20 Gy
Reference Point Dosage Given to Date: 20 Gy
Reference Point Session Dosage Given: 20 Gy
Session Number: 1

## 2022-02-16 NOTE — Telephone Encounter (Signed)
Called [name] to share the recommended steroid taper instructions for Gregory [name], but he had not started taking that medication. They thought it was to treat an upset stomach, so he did not see the need in taking it.   Since he does have the Rx, [name] suggested he go ahead and take 4 mg once per day for 5 days and then stop. If he has any issues or questions they will give me a call back.   Mont Dutton R.T.(R)(T) Radiation Special Procedures Navigator

## 2022-02-16 NOTE — Progress Notes (Signed)
  Radiation Oncology         [phone]) [phone] ________________________________  Stereotactic Treatment Procedure Note  Name: Khalif Stender. MRN: [id number]  Date: 02/16/2022  DOB: 1956/04/07  SPECIAL TREATMENT PROCEDURE    ICD-10-CM   1. 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  It showed 3-dimensional radiation distributions overlaid onto the planning CT/MRI image set.  The [facility] for the target structures as well as the organs at risk were reviewed. The documentation of the 3D plan and dosimetry are filed in the radiation oncology EMR.  NARRATIVE:  [name]. was brought to the TrueBeam stereotactic radiation treatment machine and placed supine on the CT couch. The head frame was applied, and the patient was set up for stereotactic radiosurgery.  Neurosurgery was present for the set-up and delivery  SIMULATION VERIFICATION:  In the couch zero-angle position, the patient underwent Exactrac imaging using the Brainlab system with orthogonal KV images.  These were carefully aligned and repeated to confirm treatment position for each of the isocenters.  The Exactrac snap film verification was repeated at each couch angle.  PROCEDURE: [name]. received stereotactic radiosurgery to the following targets: The two right cerebellar 20 mm and 14 mm targets were treated using 6 Rapid Arc VMAT Beams to a prescription dose of 20 Gy.  ExacTrac registration was performed for each couch angle.  The 100% isodose line was prescribed.  6 MV X-rays were delivered in the flattening filter free beam mode.  STEREOTACTIC TREATMENT MANAGEMENT:  Following delivery, the patient was transported to nursing in stable condition and monitored for possible acute effects.  Vital signs were recorded. The patient  tolerated treatment without significant acute effects, and was discharged to home in stable condition.    PLAN: Follow-up in one month.  ________________________________  Sheral Apley. Tammi Klippel, M.D.

## 2022-02-16 NOTE — Progress Notes (Addendum)
Patient [facility] to nursing for 30 minutes observation.  Denies headache, nausea, skin irritation, vision changes, and slurred speech.  Gait instability was in wheelchair leaving the clinic.  Takes Decadron 4 mg po bid daily.  No complaints. Vitals 97.2 temp, pulse 81, respirations 18, BP 115/81, pulse Ox 100%.

## 2022-02-19 ENCOUNTER — Ambulatory Visit: Payer: Medicare Other

## 2022-02-19 ENCOUNTER — Other Ambulatory Visit (HOSPITAL_COMMUNITY): Payer: Self-pay

## 2022-02-19 MED ORDER — OXYCODONE HCL 10 MG PO TABS: 10.0000 mg | ORAL_TABLET | ORAL | 0 refills | Status: DC | PRN

## 2022-02-20 ENCOUNTER — Other Ambulatory Visit: Payer: Self-pay

## 2022-02-20 ENCOUNTER — Ambulatory Visit (HOSPITAL_COMMUNITY): Payer: Medicare Other | Admitting: Hematology

## 2022-02-20 ENCOUNTER — Ambulatory Visit (INDEPENDENT_AMBULATORY_CARE_PROVIDER_SITE_OTHER): Payer: Medicare Other | Admitting: General Surgery

## 2022-02-20 ENCOUNTER — Ambulatory Visit
Admission: RE | Admit: 2022-02-20 | Discharge: 2022-02-20 | Disposition: A | Discharge status: Home / Self Care | Payer: Medicare Other | Source: Ambulatory Visit | Attending: Radiation Oncology | Admitting: Radiation Oncology

## 2022-02-20 ENCOUNTER — Encounter: Payer: Self-pay | Admitting: General Surgery

## 2022-02-20 ENCOUNTER — Ambulatory Visit: Payer: Medicare Other

## 2022-02-20 VITALS — BP 144/80 | HR 112 | Temp 98.1°F | Resp 20 | Ht 67.0 in | Wt 127.0 lb

## 2022-02-20 DIAGNOSIS — C7931 Secondary malignant neoplasm of brain: Secondary | ICD-10-CM

## 2022-02-20 DIAGNOSIS — C649 Malignant neoplasm of unspecified kidney, except renal pelvis: Secondary | ICD-10-CM | POA: Diagnosis not present

## 2022-02-20 DIAGNOSIS — Z510 Encounter for antineoplastic radiation therapy: Secondary | ICD-10-CM | POA: Diagnosis not present

## 2022-02-20 LAB — RAD ONC ARIA SESSION SUMMARY
Course Elapsed Days: 4
Plan Fractions Treated to Date: 1
Plan Prescribed Dose Per Fraction: 3 Gy
Plan Total Fractions Prescribed: 10
Plan Total Prescribed Dose: 30 Gy
Reference Point Dosage Given to Date: 3 Gy
Reference Point Session Dosage Given: 3 Gy
Session Number: 2

## 2022-02-20 NOTE — H&P (Signed)
[address] [place].; [id number]; Jun 10, 1956   HPI Patient is a 66 year old white male who was referred to my care by Dr. Delton Coombes for Port-A-Cath insertion.  He has metastatic renal cell carcinoma to the brain and is undergoing chemotherapy.  He needs central venous access. Past Medical History:  Diagnosis Date   Port-A-Cath in place 02/04/2022   Renal cell carcinoma ([place]) 10/23/2015   Skull lesion 10/20/2015   metastatic renal cell carcinoma    Past Surgical History:  Procedure Laterality Date   robotic radical right nephrectomy      Family History  Problem Relation Age of Onset   CAD Mother    CAD Father    Cancer Paternal Uncle        stomach cancer   Cancer Paternal Uncle        unknown    Current Outpatient Medications on File Prior to Visit  Medication Sig Dispense Refill   Ascorbic Acid (VITAMIN C PO) Take 2 tablets by mouth daily.     Biotin 2500 MCG CAPS Take 5,000 mcg by mouth daily.     Cholecalciferol (VITAMIN D) 50 MCG (2000 UT) tablet Take 4,000 Units by mouth daily.     Cyanocobalamin (B-12) 3000 MCG CAPS Take 6,000 mcg by mouth daily.     dexamethasone (DECADRON) 4 MG tablet Take 1 tablet (4 mg total) by mouth 2 (two) times daily with a meal. 60 tablet 5   ELDERBERRY PO Take 2 capsules by mouth daily.     Ipilimumab (YERVOY IV) Inject into the vein every 21 ( twenty-one) days. X 4 cycles     lidocaine-prilocaine (EMLA) cream Apply a small amount to port a cath site and cover with plastic wrap 1 hour prior to infusion appointments 30 g 3   LORazepam (ATIVAN) 1 MG tablet Take 1 tablet (1 mg total) by mouth every 8 (eight) hours as needed for anxiety (30 min before MRI or radiation planning/treatment). 12 tablet 0   Nivolumab (OPDIVO IV) Inject into the vein every 21 ( twenty-one) days.     Oxycodone HCl 10 MG TABS Take 1 tablet (10 mg total) by mouth every 4 (four) hours as needed. 168 tablet 0   senna-docusate (SENOKOT-S) 8.6-50 MG tablet Take 1 tablet by  mouth 2 (two) times daily.     fentaNYL (DURAGESIC) 50 MCG/HR Place 1 patch onto the skin every 3 (three) days. (Patient not taking: Reported on 02/20/2022) 5 patch 0   No current facility-administered medications on file prior to visit.    No Known Allergies  Social History   Substance and Sexual Activity  Alcohol Use Not Currently   Alcohol/week: 20.0 standard drinks of alcohol   Types: 20 Shots of liquor per week   Comment: 1-5 shot of liquor per night    Social History   Tobacco Use  Smoking Status Every Day   Packs/day: 0.50   Years: 45.00   Total pack years: 22.50   Types: Cigarettes  Smokeless Tobacco Never    Review of Systems  Constitutional:  Positive for malaise/fatigue.  HENT: Negative.    Eyes: Negative.   Respiratory: Negative.    Cardiovascular: Negative.   Gastrointestinal: Negative.   Genitourinary: Negative.   Musculoskeletal:  Positive for back pain, joint pain and neck pain.  Skin: Negative.   Neurological:  Positive for sensory change.  Endo/Heme/Allergies: Negative.   Psychiatric/Behavioral: Negative.      Objective   Vitals:   02/20/22 1102  BP: (!) 144/80  Pulse: (!) 112  Resp: 20  Temp: 98.1 F (36.7 C)  SpO2: 96%    Physical Exam Vitals reviewed.  Constitutional:      Appearance: Normal appearance. He is not ill-appearing.  HENT:     Head: Normocephalic and atraumatic.  Cardiovascular:     Rate and Rhythm: Normal rate and regular rhythm.     Heart sounds: Normal heart sounds. No murmur heard.    No friction rub. No gallop.  Pulmonary:     Effort: Pulmonary effort is normal. No respiratory distress.     Breath sounds: Normal breath sounds. No stridor. No wheezing, rhonchi or rales.  Musculoskeletal:     Comments:  swelling present in the left shoulder.  Skin:    General: Skin is warm and dry.  Neurological:     Mental Status: He is alert and oriented to person, place, and time.   Oncology notes reviewed  Assessment   Metastatic renal cell carcinoma to the brain, need for central venous access Plan  Patient is scheduled for Port-A-Cath insertion in the right subclavian vein on 02/23/2022.  The risks and benefits of the procedure including bleeding, infection, and pneumothorax were fully explained to the patient, who gave informed consent.

## 2022-02-20 NOTE — Progress Notes (Signed)
[address] Gregory [name].; [id number]; 08-25-1956   HPI Patient is a 66 year old white male who was referred to my care by Dr. Delton Coombes for Port-A-Cath insertion.  He has metastatic renal cell carcinoma to the brain and is undergoing chemotherapy.  He needs central venous access. Past Medical History:  Diagnosis Date   Port-A-Cath in place 02/04/2022   Renal cell carcinoma ([place]) 10/23/2015   Skull lesion 10/20/2015   metastatic renal cell carcinoma    Past Surgical History:  Procedure Laterality Date   robotic radical right nephrectomy      Family History  Problem Relation Age of Onset   CAD Mother    CAD Father    Cancer Paternal Uncle        stomach cancer   Cancer Paternal Uncle        unknown    Current Outpatient Medications on File Prior to Visit  Medication Sig Dispense Refill   Ascorbic Acid (VITAMIN C PO) Take 2 tablets by mouth daily.     Biotin 2500 MCG CAPS Take 5,000 mcg by mouth daily.     Cholecalciferol (VITAMIN D) 50 MCG (2000 UT) tablet Take 4,000 Units by mouth daily.     Cyanocobalamin (B-12) 3000 MCG CAPS Take 6,000 mcg by mouth daily.     dexamethasone (DECADRON) 4 MG tablet Take 1 tablet (4 mg total) by mouth 2 (two) times daily with a meal. 60 tablet 5   ELDERBERRY PO Take 2 capsules by mouth daily.     Ipilimumab (YERVOY IV) Inject into the vein every 21 ( twenty-one) days. X 4 cycles     lidocaine-prilocaine (EMLA) cream Apply a small amount to port a cath site and cover with plastic wrap 1 hour prior to infusion appointments 30 g 3   LORazepam (ATIVAN) 1 MG tablet Take 1 tablet (1 mg total) by mouth every 8 (eight) hours as needed for anxiety (30 min before MRI or radiation planning/treatment). 12 tablet 0   Nivolumab (OPDIVO IV) Inject into the vein every 21 ( twenty-one) days.     Oxycodone HCl 10 MG TABS Take 1 tablet (10 mg total) by mouth every 4 (four) hours as needed. 168 tablet 0   senna-docusate (SENOKOT-S) 8.6-50 MG tablet Take 1 tablet by  mouth 2 (two) times daily.     fentaNYL (DURAGESIC) 50 MCG/HR Place 1 patch onto the skin every 3 (three) days. (Patient not taking: Reported on 02/20/2022) 5 patch 0   No current facility-administered medications on file prior to visit.    No Known Allergies  Social History   Substance and Sexual Activity  Alcohol Use Not Currently   Alcohol/week: 20.0 standard drinks of alcohol   Types: 20 Shots of liquor per week   Comment: 1-5 shot of liquor per night    Social History   Tobacco Use  Smoking Status Every Day   Packs/day: 0.50   Years: 45.00   Total pack years: 22.50   Types: Cigarettes  Smokeless Tobacco Never    Review of Systems  Constitutional:  Positive for malaise/fatigue.  HENT: Negative.    Eyes: Negative.   Respiratory: Negative.    Cardiovascular: Negative.   Gastrointestinal: Negative.   Genitourinary: Negative.   Musculoskeletal:  Positive for back pain, joint pain and neck pain.  Skin: Negative.   Neurological:  Positive for sensory change.  Endo/Heme/Allergies: Negative.   Psychiatric/Behavioral: Negative.      Objective   Vitals:   02/20/22 1102  BP: (!) 144/80  Pulse: (!) 112  Resp: 20  Temp: 98.1 F (36.7 C)  SpO2: 96%    Physical Exam Vitals reviewed.  Constitutional:      Appearance: Normal appearance. He is not ill-appearing.  HENT:     Head: Normocephalic and atraumatic.  Cardiovascular:     Rate and Rhythm: Normal rate and regular rhythm.     Heart sounds: Normal heart sounds. No murmur heard.    No friction rub. No gallop.  Pulmonary:     Effort: Pulmonary effort is normal. No respiratory distress.     Breath sounds: Normal breath sounds. No stridor. No wheezing, rhonchi or rales.  Musculoskeletal:     Comments:  swelling present in the left shoulder.  Skin:    General: Skin is warm and dry.  Neurological:     Mental Status: He is alert and oriented to person, place, and time.   Oncology notes reviewed  Assessment   Metastatic renal cell carcinoma to the brain, need for central venous access Plan  Patient is scheduled for Port-A-Cath insertion in the right subclavian vein on 02/23/2022.  The risks and benefits of the procedure including bleeding, infection, and pneumothorax were fully explained to the patient, who gave informed consent.

## 2022-02-21 ENCOUNTER — Encounter (HOSPITAL_COMMUNITY)
Admission: RE | Admit: 2022-02-21 | Discharge: 2022-02-21 | Disposition: A | Discharge status: Home / Self Care | Payer: Medicare Other | Source: Ambulatory Visit | Attending: General Surgery | Admitting: General Surgery

## 2022-02-21 ENCOUNTER — Other Ambulatory Visit: Payer: Self-pay

## 2022-02-21 ENCOUNTER — Ambulatory Visit
Admission: RE | Admit: 2022-02-21 | Discharge: 2022-02-21 | Disposition: A | Discharge status: Home / Self Care | Payer: Medicare Other | Source: Ambulatory Visit | Attending: Radiation Oncology | Admitting: Radiation Oncology

## 2022-02-21 ENCOUNTER — Ambulatory Visit: Payer: Medicare Other

## 2022-02-21 DIAGNOSIS — Z510 Encounter for antineoplastic radiation therapy: Secondary | ICD-10-CM | POA: Diagnosis not present

## 2022-02-21 LAB — RAD ONC ARIA SESSION SUMMARY
Course Elapsed Days: 5
Plan Fractions Treated to Date: 2
Plan Prescribed Dose Per Fraction: 3 Gy
Plan Total Fractions Prescribed: 10
Plan Total Prescribed Dose: 30 Gy
Reference Point Dosage Given to Date: 6 Gy
Reference Point Session Dosage Given: 3 Gy
Session Number: 3

## 2022-02-21 NOTE — Op Note (Signed)
  Name: Gregory [name].  MRN: [id number]  Date: 02/16/2022   DOB: [birth year]  Stereotactic Radiosurgery Operative Note  PRE-OPERATIVE DIAGNOSIS:  Multiple Brain Metastases, Metastatic renal cell CA  POST-OPERATIVE DIAGNOSIS:  Multiple Brain Metastases, Metastatic renal cell CA  PROCEDURE:  Stereotactic Radiosurgery  SURGEON:  Jairo Ben, MD  RADIATION ONCOLOGIST: Dr. Tyler Pita, MD  NARRATIVE: The patient underwent a radiation treatment planning session in the radiation oncology simulation suite under the care of the radiation oncology physician and physicist.  I participated closely in the radiation treatment planning afterwards. The patient underwent planning CT which was fused to 3T high resolution MRI with 1 mm axial slices.  These images were fused on the planning system.  We contoured the gross target volumes and subsequently expanded this to yield the Planning Target Volume. I actively participated in the planning process.  I helped to define and review the target contours and also the contours of the optic pathway, eyes, brainstem and selected nearby organs at risk.  All the dose constraints for critical structures were reviewed and compared to AAPM Task Group 101.  The prescription dose conformity was reviewed.  I approved the plan electronically.    Accordingly, Gregory [name]. was brought to the TrueBeam stereotactic radiation treatment linac and placed in the custom immobilization mask.  The patient was aligned according to the IR fiducial markers with BrainLab Exactrac, then orthogonal x-rays were used in ExacTrac with the 6DOF robotic table and the shifts were made to align the patient  Gregory [name]. received stereotactic radiosurgery uneventfully.    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  I was present for the duration of the procedure.  DISPOSITION:  Following delivery, the patient was transported to nursing in stable condition and monitored for possible acute effects to be discharged to home in stable condition with follow-up in one month.  NJairo Ben MD 02/21/2022 1:45 PM

## 2022-02-21 NOTE — Addendum Note (Signed)
Encounter addended by: Consuella Lose, MD on: 02/21/2022 1:46 PM  Actions taken: Clinical Note Signed

## 2022-02-22 ENCOUNTER — Other Ambulatory Visit: Payer: Self-pay

## 2022-02-22 ENCOUNTER — Ambulatory Visit: Payer: Medicare Other

## 2022-02-22 ENCOUNTER — Ambulatory Visit
Admission: RE | Admit: 2022-02-22 | Discharge: 2022-02-22 | Disposition: A | Discharge status: Home / Self Care | Payer: Medicare Other | Source: Ambulatory Visit | Attending: Radiation Oncology | Admitting: Radiation Oncology

## 2022-02-22 DIAGNOSIS — Z510 Encounter for antineoplastic radiation therapy: Secondary | ICD-10-CM | POA: Diagnosis not present

## 2022-02-22 LAB — RAD ONC ARIA SESSION SUMMARY
Course Elapsed Days: 6
Plan Fractions Treated to Date: 3
Plan Prescribed Dose Per Fraction: 3 Gy
Plan Total Fractions Prescribed: 10
Plan Total Prescribed Dose: 30 Gy
Reference Point Dosage Given to Date: 9 Gy
Reference Point Session Dosage Given: 3 Gy
Session Number: 4

## 2022-02-23 ENCOUNTER — Ambulatory Visit (HOSPITAL_COMMUNITY): Payer: Medicare Other

## 2022-02-23 ENCOUNTER — Other Ambulatory Visit: Payer: Self-pay

## 2022-02-23 ENCOUNTER — Encounter (HOSPITAL_COMMUNITY): Payer: Self-pay | Admitting: General Surgery

## 2022-02-23 ENCOUNTER — Ambulatory Visit
Admission: RE | Admit: 2022-02-23 | Discharge: 2022-02-23 | Disposition: A | Discharge status: Home / Self Care | Payer: Medicare Other | Source: Ambulatory Visit | Attending: Radiation Oncology | Admitting: Radiation Oncology

## 2022-02-23 ENCOUNTER — Ambulatory Visit (HOSPITAL_COMMUNITY)
Admission: RE | Admit: 2022-02-23 | Discharge: 2022-02-23 | Disposition: A | Discharge status: Home / Self Care | Payer: Medicare Other | Attending: General Surgery | Admitting: General Surgery

## 2022-02-23 ENCOUNTER — Ambulatory Visit (HOSPITAL_COMMUNITY): Payer: Medicare Other | Admitting: Anesthesiology

## 2022-02-23 ENCOUNTER — Ambulatory Visit: Payer: Medicare Other

## 2022-02-23 ENCOUNTER — Encounter (HOSPITAL_COMMUNITY)
Admission: RE | Disposition: A | Discharge status: Home / Self Care | Payer: Self-pay | Source: Home / Self Care | Attending: General Surgery

## 2022-02-23 ENCOUNTER — Ambulatory Visit (HOSPITAL_BASED_OUTPATIENT_CLINIC_OR_DEPARTMENT_OTHER): Payer: Medicare Other | Admitting: Anesthesiology

## 2022-02-23 DIAGNOSIS — C7951 Secondary malignant neoplasm of bone: Secondary | ICD-10-CM | POA: Insufficient documentation

## 2022-02-23 DIAGNOSIS — G709 Myoneural disorder, unspecified: Secondary | ICD-10-CM | POA: Diagnosis not present

## 2022-02-23 DIAGNOSIS — C7800 Secondary malignant neoplasm of unspecified lung: Secondary | ICD-10-CM | POA: Diagnosis not present

## 2022-02-23 DIAGNOSIS — C7900 Secondary malignant neoplasm of unspecified kidney and renal pelvis: Secondary | ICD-10-CM | POA: Diagnosis not present

## 2022-02-23 DIAGNOSIS — Z510 Encounter for antineoplastic radiation therapy: Secondary | ICD-10-CM | POA: Diagnosis not present

## 2022-02-23 DIAGNOSIS — Z905 Acquired absence of kidney: Secondary | ICD-10-CM | POA: Insufficient documentation

## 2022-02-23 DIAGNOSIS — C649 Malignant neoplasm of unspecified kidney, except renal pelvis: Secondary | ICD-10-CM | POA: Diagnosis present

## 2022-02-23 DIAGNOSIS — G589 Mononeuropathy, unspecified: Secondary | ICD-10-CM | POA: Diagnosis not present

## 2022-02-23 DIAGNOSIS — F17210 Nicotine dependence, cigarettes, uncomplicated: Secondary | ICD-10-CM | POA: Insufficient documentation

## 2022-02-23 DIAGNOSIS — C801 Malignant (primary) neoplasm, unspecified: Secondary | ICD-10-CM

## 2022-02-23 DIAGNOSIS — C7931 Secondary malignant neoplasm of brain: Secondary | ICD-10-CM | POA: Diagnosis not present

## 2022-02-23 DIAGNOSIS — Z95828 Presence of other vascular implants and grafts: Secondary | ICD-10-CM

## 2022-02-23 HISTORY — PX: PORTACATH PLACEMENT: SHX2246

## 2022-02-23 LAB — RAD ONC ARIA SESSION SUMMARY
Course Elapsed Days: 7
Plan Fractions Treated to Date: 4
Plan Prescribed Dose Per Fraction: 3 Gy
Plan Total Fractions Prescribed: 10
Plan Total Prescribed Dose: 30 Gy
Reference Point Dosage Given to Date: 12 Gy
Reference Point Session Dosage Given: 3 Gy
Session Number: 5

## 2022-02-23 SURGERY — INSERTION, TUNNELED CENTRAL VENOUS DEVICE, WITH PORT
Anesthesia: General | Site: Chest | Laterality: Right

## 2022-02-23 MED ORDER — HEPARIN SOD (PORK) LOCK FLUSH 100 UNIT/ML IV SOLN
INTRAVENOUS | Status: DC | PRN
  Administered 2022-02-23: 400 [IU] via INTRAVENOUS

## 2022-02-23 MED ORDER — FENTANYL CITRATE PF 50 MCG/ML IJ SOSY
25.0000 ug | PREFILLED_SYRINGE | INTRAMUSCULAR | Status: DC | PRN
  Administered 2022-02-23: 50 ug via INTRAVENOUS

## 2022-02-23 MED ORDER — SODIUM CHLORIDE (PF) 0.9 % IJ SOLN
INTRAMUSCULAR | Status: DC | PRN
  Administered 2022-02-23: 500 mL via INTRAVENOUS

## 2022-02-23 MED ORDER — HEPARIN SOD (PORK) LOCK FLUSH 100 UNIT/ML IV SOLN
INTRAVENOUS | Status: AC
  Filled 2022-02-23: qty 5

## 2022-02-23 MED ORDER — CHLORHEXIDINE GLUCONATE CLOTH 2 % EX PADS: 6.0000 | MEDICATED_PAD | Freq: Once | CUTANEOUS | Status: DC

## 2022-02-23 MED ORDER — CHLORHEXIDINE GLUCONATE 0.12 % MT SOLN
15.0000 mL | Freq: Once | OROMUCOSAL | Status: AC
  Administered 2022-02-23: 15 mL via OROMUCOSAL

## 2022-02-23 MED ORDER — FENTANYL CITRATE PF 50 MCG/ML IJ SOSY
PREFILLED_SYRINGE | INTRAMUSCULAR | Status: AC
  Filled 2022-02-23: qty 1

## 2022-02-23 MED ORDER — KETOROLAC TROMETHAMINE 30 MG/ML IJ SOLN
15.0000 mg | Freq: Once | INTRAMUSCULAR | Status: DC
  Filled 2022-02-23: qty 1

## 2022-02-23 MED ORDER — CEFAZOLIN SODIUM-DEXTROSE 2-4 GM/100ML-% IV SOLN
INTRAVENOUS | Status: AC
  Filled 2022-02-23: qty 100

## 2022-02-23 MED ORDER — LIDOCAINE HCL (CARDIAC) PF 50 MG/5ML IV SOSY
PREFILLED_SYRINGE | INTRAVENOUS | Status: DC | PRN
  Administered 2022-02-23: 40 mg via INTRAVENOUS

## 2022-02-23 MED ORDER — ONDANSETRON HCL 4 MG/2ML IJ SOLN: 4.0000 mg | Freq: Once | INTRAMUSCULAR | Status: DC | PRN

## 2022-02-23 MED ORDER — LIDOCAINE HCL (PF) 1 % IJ SOLN
INTRAMUSCULAR | Status: DC | PRN
  Administered 2022-02-23: 8 mL

## 2022-02-23 MED ORDER — PROPOFOL 10 MG/ML IV BOLUS
INTRAVENOUS | Status: DC | PRN
  Administered 2022-02-23: 40 mg via INTRAVENOUS
  Administered 2022-02-23 (×3): 20 mg via INTRAVENOUS

## 2022-02-23 MED ORDER — FENTANYL CITRATE (PF) 100 MCG/2ML IJ SOLN
INTRAMUSCULAR | Status: AC
  Filled 2022-02-23: qty 2

## 2022-02-23 MED ORDER — FENTANYL CITRATE PF 50 MCG/ML IJ SOSY
50.0000 ug | PREFILLED_SYRINGE | INTRAMUSCULAR | Status: DC | PRN
  Administered 2022-02-23: 50 ug via INTRAVENOUS
  Filled 2022-02-23: qty 1

## 2022-02-23 MED ORDER — PROPOFOL 500 MG/50ML IV EMUL
INTRAVENOUS | Status: DC | PRN
  Administered 2022-02-23: 100 ug/kg/min via INTRAVENOUS

## 2022-02-23 MED ORDER — ORAL CARE MOUTH RINSE: 15.0000 mL | Freq: Once | OROMUCOSAL | Status: AC

## 2022-02-23 MED ORDER — LIDOCAINE HCL (PF) 1 % IJ SOLN
INTRAMUSCULAR | Status: AC
  Filled 2022-02-23: qty 30

## 2022-02-23 MED ORDER — FENTANYL CITRATE (PF) 100 MCG/2ML IJ SOLN
INTRAMUSCULAR | Status: DC | PRN
  Administered 2022-02-23 (×4): 25 ug via INTRAVENOUS

## 2022-02-23 MED ORDER — CEFAZOLIN SODIUM-DEXTROSE 2-4 GM/100ML-% IV SOLN
2.0000 g | INTRAVENOUS | Status: AC
  Administered 2022-02-23: 2 g via INTRAVENOUS

## 2022-02-23 MED ORDER — LACTATED RINGERS IV SOLN: INTRAVENOUS | Status: DC

## 2022-02-23 SURGICAL SUPPLY — 30 items
ADH SKN CLS APL DERMABOND .7 (GAUZE/BANDAGES/DRESSINGS) ×1
APL PRP STRL LF ISPRP CHG 10.5 (MISCELLANEOUS) ×1
APPLICATOR CHLORAPREP 10.5 ORG (MISCELLANEOUS) ×2 IMPLANT
BAG DECANTER FOR FLEXI CONT (MISCELLANEOUS) ×2 IMPLANT
CLOTH BEACON ORANGE TIMEOUT ST (SAFETY) ×2 IMPLANT
COVER LIGHT HANDLE STERIS (MISCELLANEOUS) ×4 IMPLANT
DECANTER SPIKE VIAL GLASS SM (MISCELLANEOUS) ×2 IMPLANT
DERMABOND ADVANCED (GAUZE/BANDAGES/DRESSINGS) ×1
DERMABOND ADVANCED .7 DNX12 (GAUZE/BANDAGES/DRESSINGS) ×1 IMPLANT
DRAPE C-ARM FOLDED MOBILE STRL (DRAPES) ×2 IMPLANT
ELECT REM PT RETURN 9FT ADLT (ELECTROSURGICAL) ×2
ELECTRODE REM PT RTRN 9FT ADLT (ELECTROSURGICAL) ×1 IMPLANT
GLOVE BIOGEL PI IND STRL 7.0 (GLOVE) ×2 IMPLANT
GLOVE BIOGEL PI INDICATOR 7.0 (GLOVE) ×2
GLOVE SURG SS PI 7.5 STRL IVOR (GLOVE) ×2 IMPLANT
GOWN STRL REUS W/TWL LRG LVL3 (GOWN DISPOSABLE) ×4 IMPLANT
IV NS 500ML (IV SOLUTION) ×2
IV NS 500ML BAXH (IV SOLUTION) ×1 IMPLANT
KIT PORT POWER 8FR ISP MRI (Port) ×2 IMPLANT
KIT TURNOVER KIT A (KITS) ×2 IMPLANT
NDL HYPO 25X1 1.5 SAFETY (NEEDLE) ×1 IMPLANT
NEEDLE HYPO 25X1 1.5 SAFETY (NEEDLE) ×2 IMPLANT
PACK MINOR (CUSTOM PROCEDURE TRAY) ×2 IMPLANT
PAD ARMBOARD 7.5X6 YLW CONV (MISCELLANEOUS) ×2 IMPLANT
SET BASIN LINEN APH (SET/KITS/TRAYS/PACK) ×2 IMPLANT
SUT MNCRL AB 4-0 PS2 18 (SUTURE) ×2 IMPLANT
SUT VIC AB 3-0 SH 27 (SUTURE) ×2
SUT VIC AB 3-0 SH 27X BRD (SUTURE) ×1 IMPLANT
SYR 5ML LL (SYRINGE) ×2 IMPLANT
SYR CONTROL 10ML LL (SYRINGE) ×2 IMPLANT

## 2022-02-26 ENCOUNTER — Ambulatory Visit: Payer: Medicare Other

## 2022-02-26 ENCOUNTER — Other Ambulatory Visit (HOSPITAL_COMMUNITY): Payer: Self-pay | Admitting: *Deleted

## 2022-02-26 MED ORDER — GABAPENTIN 100 MG PO CAPS: 100.0000 mg | ORAL_CAPSULE | Freq: Three times a day (TID) | ORAL | 3 refills | Status: AC

## 2022-02-26 MED ORDER — DEXAMETHASONE 4 MG PO TABS: 4.0000 mg | ORAL_TABLET | Freq: Every day | ORAL | 3 refills | Status: DC

## 2022-02-26 MED ORDER — GABAPENTIN 100 MG PO CAPS: 100.0000 mg | ORAL_CAPSULE | Freq: Three times a day (TID) | ORAL | 3 refills | Status: DC

## 2022-02-27 ENCOUNTER — Other Ambulatory Visit: Payer: Self-pay

## 2022-02-27 ENCOUNTER — Encounter (HOSPITAL_COMMUNITY): Payer: Self-pay | Admitting: General Surgery

## 2022-02-27 ENCOUNTER — Ambulatory Visit: Payer: Medicare Other

## 2022-02-27 ENCOUNTER — Ambulatory Visit
Admission: RE | Admit: 2022-02-27 | Discharge: 2022-02-27 | Disposition: A | Discharge status: Home / Self Care | Payer: Medicare Other | Source: Ambulatory Visit | Attending: Radiation Oncology | Admitting: Radiation Oncology

## 2022-02-27 DIAGNOSIS — Z510 Encounter for antineoplastic radiation therapy: Secondary | ICD-10-CM | POA: Diagnosis not present

## 2022-02-27 LAB — RAD ONC ARIA SESSION SUMMARY
Course Elapsed Days: 11
Plan Fractions Treated to Date: 5
Plan Prescribed Dose Per Fraction: 3 Gy
Plan Total Fractions Prescribed: 10
Plan Total Prescribed Dose: 30 Gy
Reference Point Dosage Given to Date: 15 Gy
Reference Point Session Dosage Given: 3 Gy
Session Number: 6

## 2022-02-28 ENCOUNTER — Ambulatory Visit: Payer: Medicare Other

## 2022-02-28 ENCOUNTER — Other Ambulatory Visit: Payer: Medicare Other

## 2022-03-01 ENCOUNTER — Other Ambulatory Visit: Payer: Self-pay

## 2022-03-01 ENCOUNTER — Inpatient Hospital Stay (HOSPITAL_COMMUNITY): Payer: Medicare Other

## 2022-03-01 ENCOUNTER — Ambulatory Visit
Admission: RE | Admit: 2022-03-01 | Discharge: 2022-03-01 | Disposition: A | Discharge status: Home / Self Care | Payer: Medicare Other | Source: Ambulatory Visit | Attending: Radiation Oncology | Admitting: Radiation Oncology

## 2022-03-01 ENCOUNTER — Inpatient Hospital Stay (HOSPITAL_BASED_OUTPATIENT_CLINIC_OR_DEPARTMENT_OTHER): Payer: Medicare Other | Admitting: Hematology

## 2022-03-01 VITALS — BP 117/68 | HR 74 | Temp 98.4°F | Resp 18

## 2022-03-01 DIAGNOSIS — C649 Malignant neoplasm of unspecified kidney, except renal pelvis: Secondary | ICD-10-CM

## 2022-03-01 DIAGNOSIS — Z5112 Encounter for antineoplastic immunotherapy: Secondary | ICD-10-CM | POA: Diagnosis not present

## 2022-03-01 DIAGNOSIS — C7951 Secondary malignant neoplasm of bone: Secondary | ICD-10-CM

## 2022-03-01 DIAGNOSIS — Z510 Encounter for antineoplastic radiation therapy: Secondary | ICD-10-CM | POA: Diagnosis not present

## 2022-03-01 DIAGNOSIS — Z95828 Presence of other vascular implants and grafts: Secondary | ICD-10-CM

## 2022-03-01 LAB — RAD ONC ARIA SESSION SUMMARY
Course Elapsed Days: 13
Plan Fractions Treated to Date: 6
Plan Prescribed Dose Per Fraction: 3 Gy
Plan Total Fractions Prescribed: 10
Plan Total Prescribed Dose: 30 Gy
Reference Point Dosage Given to Date: 18 Gy
Reference Point Session Dosage Given: 3 Gy
Session Number: 7

## 2022-03-01 LAB — COMPREHENSIVE METABOLIC PANEL
ALT: 18 U/L (ref 0–44)
AST: 29 U/L (ref 15–41)
Albumin: 3.1 g/dL — ABNORMAL LOW (ref 3.5–5.0)
Alkaline Phosphatase: 105 U/L (ref 38–126)
Anion gap: 6 (ref 5–15)
BUN: 17 mg/dL (ref 8–23)
CO2: 26 mmol/L (ref 22–32)
Calcium: 8.6 mg/dL — ABNORMAL LOW (ref 8.9–10.3)
Chloride: 101 mmol/L (ref 98–111)
Creatinine, Ser: 0.74 mg/dL (ref 0.61–1.24)
GFR, Estimated: >60 mL/min (ref >60)
Glucose, Bld: 123 mg/dL — ABNORMAL HIGH (ref 70–99)
Potassium: 4.1 mmol/L (ref 3.5–5.1)
Sodium: 133 mmol/L — ABNORMAL LOW (ref 135–145)
Total Bilirubin: 0.5 mg/dL (ref 0.3–1.2)
Total Protein: 6.7 g/dL (ref 6.5–8.1)

## 2022-03-01 LAB — CBC WITH DIFFERENTIAL/PLATELET
Abs Immature Granulocytes: 0.08 10*3/uL — ABNORMAL HIGH (ref 0.00–0.07)
Basophils Absolute: 0.1 10*3/uL (ref 0.0–0.1)
Basophils Relative: 1 %
Eosinophils Absolute: 0.1 10*3/uL (ref 0.0–0.5)
Eosinophils Relative: 1 %
HCT: 38.4 % — ABNORMAL LOW (ref 39.0–52.0)
Hemoglobin: 12.4 g/dL — ABNORMAL LOW (ref 13.0–17.0)
Immature Granulocytes: 1 %
Lymphocytes Relative: 6 %
Lymphs Abs: 0.6 10*3/uL — ABNORMAL LOW (ref 0.7–4.0)
MCH: 26.4 pg (ref 26.0–34.0)
MCHC: 32.3 g/dL (ref 30.0–36.0)
MCV: 81.9 fL (ref 80.0–100.0)
Monocytes Absolute: 0.9 10*3/uL (ref 0.1–1.0)
Monocytes Relative: 9 %
Neutro Abs: 8.2 10*3/uL — ABNORMAL HIGH (ref 1.7–7.7)
Neutrophils Relative %: 82 %
Platelets: 302 10*3/uL (ref 150–400)
RBC: 4.69 MIL/uL (ref 4.22–5.81)
RDW: 14.2 % (ref 11.5–15.5)
WBC: 9.9 10*3/uL (ref 4.0–10.5)
nRBC: 0 % (ref 0.0–0.2)

## 2022-03-01 LAB — TSH: TSH: 2.841 u[IU]/mL (ref 0.350–4.500)

## 2022-03-01 LAB — MAGNESIUM: Magnesium: 1.9 mg/dL (ref 1.7–2.4)

## 2022-03-01 MED ORDER — SODIUM CHLORIDE 0.9 % IV SOLN
1.0000 mg/kg | Freq: Once | INTRAVENOUS | Status: AC
  Administered 2022-03-01: 60 mg via INTRAVENOUS
  Filled 2022-03-01: qty 12

## 2022-03-01 MED ORDER — DIPHENHYDRAMINE HCL 50 MG/ML IJ SOLN
25.0000 mg | Freq: Once | INTRAMUSCULAR | Status: AC
  Administered 2022-03-01: 25 mg via INTRAVENOUS
  Filled 2022-03-01: qty 1

## 2022-03-01 MED ORDER — SODIUM CHLORIDE 0.9% FLUSH
10.0000 mL | INTRAVENOUS | Status: DC | PRN
  Administered 2022-03-01 (×2): 10 mL

## 2022-03-01 MED ORDER — SODIUM CHLORIDE 0.9 % IV SOLN: Freq: Once | INTRAVENOUS | Status: AC

## 2022-03-01 MED ORDER — SODIUM CHLORIDE 0.9 % IV SOLN
3.0700 mg/kg | Freq: Once | INTRAVENOUS | Status: AC
  Administered 2022-03-01: 180 mg via INTRAVENOUS
  Filled 2022-03-01: qty 10

## 2022-03-01 MED ORDER — FAMOTIDINE IN NACL 20-0.9 MG/50ML-% IV SOLN
20.0000 mg | Freq: Once | INTRAVENOUS | Status: AC
  Administered 2022-03-01: 20 mg via INTRAVENOUS
  Filled 2022-03-01: qty 50

## 2022-03-01 MED ORDER — MEGESTROL ACETATE 400 MG/10ML PO SUSP: 400.0000 mg | Freq: Two times a day (BID) | ORAL | 3 refills | Status: DC

## 2022-03-01 MED ORDER — HEPARIN SOD (PORK) LOCK FLUSH 100 UNIT/ML IV SOLN
500.0000 [IU] | Freq: Once | INTRAVENOUS | Status: AC | PRN
  Administered 2022-03-01: 500 [IU]

## 2022-03-01 NOTE — Progress Notes (Signed)
Ipilimumab (YERVOY) Patient Monitoring Assessment   Is the patient experiencing any of the following general symptoms?:  '[]'$ Difficulty performing normal activities '[]'$ Feeling sluggish or cold all the time '[]'$ Unusual weight gain '[]'$ Constant or unusual headaches '[]'$ Feeling dizzy or faint '[]'$ Changes in eyesight (blurry vision, double vision, or other vision problems) '[]'$ Changes in mood or behavior (ex: decreased sex drive, irritability, or forgetfulness) '[]'$ Starting new medications (ex: steroids, other medications that lower immune response) '[]'$ Patient is not experiencing any of the general symptoms above.    Gastrointestinal  Patient is having 0 bowel movements each day, not consistent with daily bowel movements, had episode of diarrhea yesterday.  Is this different from baseline? '[]'$ Yes '[x]'$ No Are your stools watery or do they have a foul smell? '[]'$ Yes '[x]'$ No Have you seen blood in your stools? '[]'$ Yes '[x]'$ No Are your stools dark, tarry, or sticky? '[]'$ Yes '[x]'$ No Are you having pain or tenderness in your belly? '[]'$ Yes '[x]'$ No  Skin Does your skin itch? '[x]'$ Yes '[]'$ No Just on his back Do you have a rash? '[]'$ Yes '[x]'$ No Has your skin blistered and/or peeled? '[]'$ Yes '[x]'$ No Do you have sores in your mouth? '[]'$ Yes '[x]'$ No  Hepatic Has your urine been dark or tea colored? '[]'$ Yes '[x]'$ No Have you noticed that your skin or the whites of your eyes are turning yellow? '[]'$ Yes '[x]'$ No Are you bleeding or bruising more easily than normal? '[]'$ Yes '[x]'$ No Are you nauseous and/or vomiting? '[]'$ Yes '[x]'$ No Do you have pain on the right side of your stomach? '[]'$ Yes '[x]'$ No  Neurologic  Are you having unusual weakness of legs, arms, or face? '[]'$ Yes '[x]'$ No baseline weakness in legs Are you having numbness or tingling in your hands or feet? '[]'$ Yes '[x]'$ No   Patient okay for treatment today per Dr. Delton Coombes. Patient tolerated chemotherapy with no complaints voiced. Side effects with management reviewed understanding verbalized. Port site clean and  dry with no bruising or swelling noted at site. Good blood return noted before and after administration of chemotherapy. Band aid applied. Patient left in satisfactory condition with VSS and no s/s of distress noted.  Elpidio Anis

## 2022-03-01 NOTE — Patient Instructions (Addendum)
[place] [facility] Discharge Instructions   You were seen and examined today by Dr. Delton Coombes.  He reviewed the results of your lab work which is normal/stable.   We will proceed with your treatment today.   We sent a prescription to your pharmacy for an appetite stimulant called Megace. Take as prescribed.  Return as scheduled in 3 weeks.    Thank you for choosing [facility] to provide your oncology and hematology care.  To afford each patient quality time with our provider, please arrive at least 15 minutes before your scheduled appointment time.   If you have a lab appointment with the [facility] please come in thru the Main Entrance and check in at the main information desk.  You need to re-schedule your appointment should you arrive 10 or more minutes late.  We strive to give you quality time with our providers, and arriving late affects you and other patients whose appointments are after yours.  Also, if you no show three or more times for appointments you may be dismissed from the clinic at the providers discretion.     Again, thank you for choosing [facility].  Our hope is that these requests will decrease the amount of time that you wait before being seen by our physicians.       _____________________________________________________________  Should you have questions after your visit to [facility], please contact our office at [phone] and follow the prompts.  Our office hours are 8:00 a.m. and 4:30 p.m. Monday - Friday.  Please note that voicemails left after 4:00 p.m. may not be returned until the following business day.  We are closed weekends and major holidays.  You do have access to a nurse 24-7, just call the main number to the clinic [phone] and do not press any options, hold on the line and a nurse will answer the phone.    For prescription refill requests, have  your pharmacy contact our office and allow 72 hours.    Due to Covid, you will need to wear a mask upon entering the hospital. If you do not have a mask, a mask will be given to you at the Main Entrance upon arrival. For doctor visits, patients may have 1 support person age 22 or older with them. For treatment visits, patients can not have anyone with them due to social distancing guidelines and our immunocompromised population.

## 2022-03-01 NOTE — Patient Instructions (Signed)
[facility]  Discharge Instructions: Thank you for choosing [facility] to provide your oncology and hematology care.  If you have a lab appointment with the [facility], please come in thru the Main Entrance and check in at the main information desk.  Wear comfortable clothing and clothing appropriate for easy access to any Portacath or PICC line.   We strive to give you quality time with your provider. You may need to reschedule your appointment if you arrive late (15 or more minutes).  Arriving late affects you and other patients whose appointments are after yours.  Also, if you miss three or more appointments without notifying the office, you may be dismissed from the clinic at the provider's discretion.      For prescription refill requests, have your pharmacy contact our office and allow 72 hours for refills to be completed.    Today you received the following chemotherapy and/or immunotherapy agents Opdivo and Yervoy, return as scheduled. Ipilimumab injection What is this medication? IPILIMUMAB (IP i LIM ue mab) is a monoclonal antibody. It treats colorectal cancer, esophageal cancer, kidney cancer, liver cancer, lung cancer, melanoma, and mesothelioma. This medicine may be used for other purposes; ask your health care provider or pharmacist if you have questions. COMMON BRAND NAME(S): YERVOY What should I tell my care team before I take this medication? They need to know if you have any of these conditions: autoimmune diseases like Crohn's disease, ulcerative colitis, or lupus have had or planning to have an allogeneic stem cell transplant (uses someone else's stem cells) history of organ transplant nervous system problems like myasthenia gravis or Guillain-Barre syndrome an unusual or allergic reaction to ipilimumab, other medicines, foods, dyes, or preservatives pregnant or trying to get pregnant breast-feeding How should I use this medication? This  medicine is for infusion into a vein. It is given by a health care professional in a hospital or clinic setting. A special MedGuide will be given to you before each treatment. Be sure to read this information carefully each time. Talk to your pediatrician regarding the use of this medicine in children. While this drug may be prescribed for children as young as 12 years for selected conditions, precautions do apply. Overdosage: If you think you have taken too much of this medicine contact a poison control center or emergency room at once. NOTE: This medicine is only for you. Do not share this medicine with others. What if I miss a dose? It is important not to miss your dose. Call your doctor or health care professional if you are unable to keep an appointment. What may interact with this medication? Interactions are not expected. This list may not describe all possible interactions. Give your health care provider a list of all the medicines, herbs, non-prescription drugs, or dietary supplements you use. Also tell them if you smoke, drink alcohol, or use illegal drugs. Some items may interact with your medicine. What should I watch for while using this medication? Tell your doctor or healthcare professional if your symptoms do not start to get better or if they get worse. Do not become pregnant while taking this medicine or for 3 months after stopping it. Women should inform their doctor if they wish to become pregnant or think they might be pregnant. There is a potential for serious side effects to an unborn child. Talk to your health care professional or pharmacist for more information. Do not breast-feed an infant while taking this medicine or  for 3 months after the last dose. Your condition will be monitored carefully while you are receiving this medicine. You may need blood work done while you are taking this medicine. What side effects may I notice from receiving this medication? Side effects  that you should report to your doctor or health care professional as soon as possible: allergic reactions like skin rash, itching or hives, swelling of the face, lips, or tongue black, tarry stools bloody or watery diarrhea changes in vision dizziness eye pain fast, irregular heartbeat feeling anxious feeling faint or lightheaded, falls nausea, vomiting pain, tingling, numbness in the hands or feet redness, blistering, peeling or loosening of the skin, including inside the mouth signs and symptoms of liver injury like dark yellow or brown urine; general ill feeling or flu-like symptoms; light-colored stools; loss of appetite; nausea; right upper belly pain; unusually weak or tired; yellowing of the eyes or skin unusual bleeding or bruising Side effects that usually do not require medical attention (report to your doctor or health care professional if they continue or are bothersome): headache loss of appetite trouble sleeping This list may not describe all possible side effects. Call your doctor for medical advice about side effects. You may report side effects to FDA at 1-800-FDA-1088. Where should I keep my medication? This drug is given in a hospital or clinic and will not be stored at home. NOTE: This sheet is a summary. It may not cover all possible information. If you have questions about this medicine, talk to your doctor, pharmacist, or health care provider.  2023 Elsevier/Gold Standard (2021-07-21 00:00:00)  Nivolumab injection What is this medication? NIVOLUMAB (nye VOL ue mab) is a monoclonal antibody. It treats certain types of cancer. Some of the cancers treated are colon cancer, head and neck cancer, Hodgkin lymphoma, lung cancer, and melanoma. This medicine may be used for other purposes; ask your health care provider or pharmacist if you have questions. COMMON BRAND NAME(S): Opdivo What should I tell my care team before I take this medication? They need to know if you  have any of these conditions: Autoimmune diseases such as Crohn's disease, ulcerative colitis, or lupus Have had or planning to have an allogeneic stem cell transplant (uses someone else's stem cells) History of chest radiation Organ transplant Nervous system problems such as myasthenia gravis or Guillain-Barre syndrome An unusual or allergic reaction to nivolumab, other medicines, foods, dyes, or preservatives Pregnant or trying to get pregnant Breast-feeding How should I use this medication? This medication is injected into a vein. It is given in a hospital or clinic setting. A special MedGuide will be given to you before each treatment. Be sure to read this information carefully each time. Talk to your care team regarding the use of this medication in children. While it may be prescribed for children as young as 12 years for selected conditions, precautions do apply. Overdosage: If you think you have taken too much of this medicine contact a poison control center or emergency room at once. NOTE: This medicine is only for you. Do not share this medicine with others. What if I miss a dose? Keep appointments for follow-up doses. It is important not to miss your dose. Call your care team if you are unable to keep an appointment. What may interact with this medication? Interactions have not been studied. This list may not describe all possible interactions. Give your health care provider a list of all the medicines, herbs, non-prescription drugs, or dietary supplements you  use. Also tell them if you smoke, drink alcohol, or use illegal drugs. Some items may interact with your medicine. What should I watch for while using this medication? Your condition will be monitored carefully while you are receiving this medication. You may need blood work done while you are taking this medication. Do not become pregnant while taking this medication or for 5 months after stopping it. Women should inform  their care team if they wish to become pregnant or think they might be pregnant. There is a potential for serious harm to an unborn child. Talk to your care team for more information. Do not breast-feed an infant while taking this medication or for 5 months after stopping it. What side effects may I notice from receiving this medication? Side effects that you should report to your care team as soon as possible: Allergic reactions--skin rash, itching, hives, swelling of the face, lips, tongue, or throat Bloody or black, tar-like stools Change in vision Chest pain Diarrhea Dry cough, shortness of breath or trouble breathing Eye pain Fast or irregular heartbeat Fever, chills High blood sugar (hyperglycemia)--increased thirst or amount of urine, unusual weakness or fatigue, blurry vision High thyroid levels (hyperthyroidism)--fast or irregular heartbeat, weight loss, excessive sweating or sensitivity to heat, tremors or shaking, anxiety, nervousness, irregular menstrual cycle or spotting Kidney injury--decrease in the amount of urine, swelling of the ankles, hands, or feet Liver injury--right upper belly pain, loss of appetite, nausea, light-colored stool, dark yellow or brown urine, yellowing skin or eyes, unusual weakness or fatigue Low red blood cell count--unusual weakness or fatigue, dizziness, headache, trouble breathing Low thyroid levels (hypothyroidism)--unusual weakness or fatigue, increased sensitivity to cold, constipation, hair loss, dry skin, weight gain, feelings of depression Mood and behavior changes-confusion, change in sex drive or performance, irritability Muscle pain or cramps Pain, tingling, or numbness in the hands or feet, muscle weakness, trouble walking, loss of balance or coordination Red or dark brown urine Redness, blistering, peeling, or loosening of the skin, including inside the mouth Stomach pain Unusual bruising or bleeding Side effects that usually do not  require medical attention (report to your care team if they continue or are bothersome): Bone pain Constipation Loss of appetite Nausea Tiredness Vomiting This list may not describe all possible side effects. Call your doctor for medical advice about side effects. You may report side effects to FDA at 1-800-FDA-1088. Where should I keep my medication? This medication is given in a hospital or clinic and will not be stored at home. NOTE: This sheet is a summary. It may not cover all possible information. If you have questions about this medicine, talk to your doctor, pharmacist, or health care provider.  2023 Elsevier/Gold Standard (2021-07-21 00:00:00)    To help prevent nausea and vomiting after your treatment, we encourage you to take your nausea medication as directed.  BELOW ARE SYMPTOMS THAT SHOULD BE REPORTED IMMEDIATELY: *FEVER GREATER THAN 100.4 F (38 C) OR HIGHER *CHILLS OR SWEATING *NAUSEA AND VOMITING THAT IS NOT CONTROLLED WITH YOUR NAUSEA MEDICATION *UNUSUAL SHORTNESS OF BREATH *UNUSUAL BRUISING OR BLEEDING *URINARY PROBLEMS (pain or burning when urinating, or frequent urination) *BOWEL PROBLEMS (unusual diarrhea, constipation, pain near the anus) TENDERNESS IN MOUTH AND THROAT WITH OR WITHOUT PRESENCE OF ULCERS (sore throat, sores in mouth, or a toothache) UNUSUAL RASH, SWELLING OR PAIN  UNUSUAL VAGINAL DISCHARGE OR ITCHING   Items with * indicate a potential emergency and should be followed up as soon as possible or go to the  Emergency Department if any problems should occur.  Please show the CHEMOTHERAPY ALERT CARD or IMMUNOTHERAPY ALERT CARD at check-in to the Emergency Department and triage nurse.  Should you have questions after your visit or need to cancel or reschedule your appointment, please contact [facility] [phone]  and follow the prompts.  Office hours are 8:00 a.m. to 4:30 p.m. Monday - Friday. Please note that voicemails left after  4:00 p.m. may not be returned until the following business day.  We are closed weekends and major holidays. You have access to a nurse at all times for urgent questions. Please call the main number to the clinic [phone] and follow the prompts.  For any non-urgent questions, you may also contact your provider using MyChart. We now offer e-Visits for anyone 53 and older to request care online for non-urgent symptoms. For details visit [email or web address].   Also download the MyChart app! Go to the app store, search "MyChart", open the app, select , and log in with your MyChart username and password.  Masks are optional in the cancer centers. If you would like for your care team to wear a mask while they are taking care of you, please let them know. For doctor visits, patients may have with them one support person who is at least 67 years old. At this time, visitors are not allowed in the infusion area.

## 2022-03-01 NOTE — Progress Notes (Signed)
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 There is involvement of the medial clivus, occipital condyle, petrous apex, with extension into the RIGHT cavernous sinus and sphenoid sinus...   10/21/2015 Imaging   CT abd/pelvis- Enlarged irregular enhancing mass partially exophytic from the RIGHT kidney consistent with RENAL CELL CARCINOMA. No evidence of involvement of the RIGHT renal vein.No retroperitoneal lymphadenopathy.   10/21/2015 Imaging   CT chest- No evidence thoracic metastasis. Nodule along the RIGHT oblique fissure likely represents a benign intrafissural lymph node   10/21/2015 Imaging   MRI brain- Destructive right central/posterior skullbase mass consistent with metastasis as previously described.   10/23/2015 Initial Diagnosis   Renal cell carcinoma ([place])   11/01/2015 Procedure   US guided biopsy by IR   11/02/2015 Pathology Results   CLEAR CELL RENAL CELL CARCINOMA, WHO NUCLEAR GRADE 2   Metastatic renal cell carcinoma to bone ([place])  11/06/2015 Initial Diagnosis   Metastatic renal cell carcinoma to bone ([place])   02/08/2022 -  Chemotherapy   Patient is on Treatment Plan : RENAL CELL CARCINOMA Nivolumab + Ipilimumab q21d / Nivolumab q28d       CANCER STAGING:  Cancer Staging  Metastatic renal cell carcinoma to bone [facility]) Staging form: Kidney, AJCC 8th Edition - Clinical stage from 01/03/2022: Stage IV (cT1b, cNX, pM1) - Unsigned   INTERVAL HISTORY:  [name], a 66 y.o. male,  returns for routine follow-up and consideration for next cycle of chemotherapy. Gregory [name] was last seen on 02/08/2022.  Due for cycle #2 of Ipilimumab and nivolumab today.   Overall, he tells me he has been feeling pretty well. He reports left shoulder pain, pain between his shoulder blades, and left leg pain. He reports diarrhea yesterday which has since resolved. He denies falls and incontinence. His appetite is poor. He is drinking 1-2 premier protein bottles daily. He continues to take oxycodone every 4-6 hours.   Overall, he feels ready for next cycle of chemo today.    REVIEW OF SYSTEMS:  Review of Systems  Constitutional:  Positive for appetite change and fatigue.  Gastrointestinal:  Positive for diarrhea.  Genitourinary:  Negative for bladder incontinence.   Musculoskeletal:  Positive for arthralgias (2/10 shoulders).  Neurological:  Positive for numbness (L hand).  Psychiatric/Behavioral:  Positive for sleep disturbance.   All other systems reviewed and are negative.   PAST MEDICAL/SURGICAL HISTORY:  Past Medical History:  Diagnosis Date   Port-A-Cath in place 02/04/2022   Renal cell carcinoma ([place]) 10/23/2015   Skull lesion 10/20/2015   metastatic renal cell carcinoma   Past Surgical History:  Procedure Laterality Date   PORTACATH PLACEMENT Right 02/23/2022   Procedure: INSERTION PORT-A-CATH;  Surgeon: Aviva Signs, MD;  Location: [facility];  Service: General;  Laterality: Right;   robotic radical right nephrectomy      SOCIAL HISTORY:  Social History   Socioeconomic History   Marital status: Single    Spouse name: Not on file   Number of children: Not on file  Years of education: Not on file   Highest education level: Not on file  Occupational History   Occupation: Programmer, systems: OTHER    Comment: [employer]  Tobacco Use   Smoking status: Every Day    Packs/day: 0.50    Years: 45.00    Total pack years: 22.50    Types: Cigarettes   Smokeless  tobacco: Never  Vaping Use   Vaping Use: Never used  Substance and Sexual Activity   Alcohol use: Not Currently    Alcohol/week: 20.0 standard drinks of alcohol    Types: 20 Shots of liquor per week    Comment: 1-5 shot of liquor per night   Drug use: Yes    Frequency: 1.0 times per week    Comment: occasionally   Sexual activity: Yes  Other Topics Concern   Not on file  Social History Narrative   Not on file   Social Determinants of Health   Financial Resource Strain: Not on file  Food Insecurity: Not on file  Transportation Needs: Not on file  Physical Activity: Not on file  Stress: Not on file  Social Connections: Not on file  Intimate Partner Violence: Not on file    FAMILY HISTORY:  Family History  Problem Relation Age of Onset   CAD Mother    CAD Father    Cancer Paternal Uncle        stomach cancer   Cancer Paternal Uncle        unknown    CURRENT MEDICATIONS:  Current Outpatient Medications  Medication Sig Dispense Refill   Ascorbic Acid (VITAMIN C PO) Take 2 tablets by mouth daily.     Biotin 2500 MCG CAPS Take 5,000 mcg by mouth daily.     Cholecalciferol (VITAMIN D) 50 MCG (2000 UT) tablet Take 4,000 Units by mouth daily.     Cyanocobalamin (B-12) 3000 MCG CAPS Take 6,000 mcg by mouth daily.     dexamethasone (DECADRON) 4 MG tablet Take 1 tablet (4 mg total) by mouth daily. 30 tablet 3   ELDERBERRY PO Take 2 capsules by mouth daily.     fentaNYL (DURAGESIC) 50 MCG/HR Place 1 patch onto the skin every 3 (three) days. (Patient not taking: Reported on 02/20/2022) 5 patch 0   gabapentin (NEURONTIN) 100 MG capsule Take 1 capsule (100 mg total) by mouth 3 (three) times daily. 90 capsule 3   Ipilimumab (YERVOY IV) Inject into the vein every 21 ( twenty-one) days. X 4 cycles     lidocaine-prilocaine (EMLA) cream Apply a small amount to port a cath site and cover with plastic wrap 1 hour prior to infusion appointments 30 g 3   LORazepam (ATIVAN) 1 MG tablet Take 1  tablet (1 mg total) by mouth every 8 (eight) hours as needed for anxiety (30 min before MRI or radiation planning/treatment). 12 tablet 0   Nivolumab (OPDIVO IV) Inject into the vein every 21 ( twenty-one) days.     Oxycodone HCl 10 MG TABS Take 1 tablet (10 mg total) by mouth every 4 (four) hours as needed. 168 tablet 0   senna-docusate (SENOKOT-S) 8.6-50 MG tablet Take 1 tablet by mouth 2 (two) times daily.     No current facility-administered medications for this visit.    ALLERGIES:  No Known Allergies  PHYSICAL EXAM:  Performance status (ECOG): 1 - Symptomatic but completely ambulatory  There were no vitals filed for this visit. Wt Readings from Last 3 Encounters:  02/20/22  127 lb (57.6 kg)  02/08/22 128 lb 9.6 oz (58.3 kg)  02/01/22 129 lb 6.4 oz (58.7 kg)   Physical Exam Vitals reviewed.  Constitutional:      Appearance: Normal appearance.  Cardiovascular:     Rate and Rhythm: Normal rate and regular rhythm.     Pulses: Normal pulses.     Heart sounds: Normal heart sounds.  Pulmonary:     Effort: Pulmonary effort is normal.     Breath sounds: Normal breath sounds.  Musculoskeletal:     Right lower leg: No edema.     Left lower leg: No edema.  Neurological:     General: No focal deficit present.     Mental Status: He is alert and oriented to person, place, and time.  Psychiatric:        Mood and Affect: Mood normal.        Behavior: Behavior normal.     LABORATORY DATA:  I have reviewed the labs as listed.     Latest Ref Rng & Units 02/08/2022    8:48 AM 02/01/2022   10:09 AM 01/17/2022    8:28 AM  CBC  WBC 4.0 - 10.5 K/uL 11.9  11.3  10.0   Hemoglobin 13.0 - 17.0 g/dL 14.0  13.2  13.1   Hematocrit 39.0 - 52.0 % 44.3  40.5  40.6   Platelets 150 - 400 K/uL 354  300  316       Latest Ref Rng & Units 02/08/2022    8:48 AM 02/01/2022   10:09 AM 01/30/2022   11:06 AM  CMP  Glucose 70 - 99 mg/dL 126  108    BUN 8 - 23 mg/dL 18  18    Creatinine 0.61 - 1.24 mg/dL  1.08  1.10  1.30   Sodium 135 - 145 mmol/L 137  135    Potassium 3.5 - 5.1 mmol/L 3.8  3.7    Chloride 98 - 111 mmol/L 98  101    CO2 22 - 32 mmol/L 28  24    Calcium 8.9 - 10.3 mg/dL 10.4  9.9    Total Protein 6.5 - 8.1 g/dL 7.6  7.3    Total Bilirubin 0.3 - 1.2 mg/dL 0.5  0.6    Alkaline Phos 38 - 126 U/L 132  120    AST 15 - 41 U/L 29  29    ALT 0 - 44 U/L 17  18      DIAGNOSTIC IMAGING:  I have independently reviewed the scans and discussed with the patient. DG Chest Port 1 View  Result Date: 02/23/2022 CLINICAL DATA:  Status post port placement. EXAM: PORTABLE CHEST 1 VIEW COMPARISON:  CT chest, abdomen, and pelvis 01/30/2022 FINDINGS: A right subclavian Port-A-Cath has been placed and terminates over the lower SVC. The cardiomediastinal silhouette is within normal limits for portable AP technique. A background of pulmonary emphysema is again noted. There are small nodular densities bilaterally with numerous lung nodule shown on the prior CT. There is persistent right lower lobe collapse. No sizable pleural effusion or pneumothorax is identified. Osseous metastatic disease is again noted including a destructive left scapular mass. IMPRESSION: 1. Interval Port-A-Cath placement as above.  No pneumothorax. 2. Known pulmonary and osseous metastatic disease. Persistent right lower lobe collapse. Electronically Signed   By: Logan Bores M.D.   On: 02/23/2022 10:07   DG C-Arm 1-60 Min-No Report  Result Date: 02/23/2022 Fluoroscopy was utilized by the requesting physician.  No  radiographic interpretation.   MR Brain W Wo Contrast  Result Date: 02/06/2022 CLINICAL DATA:  Metastatic renal cell carcinoma. SRS treatment planning. EXAM: MRI HEAD WITHOUT AND WITH CONTRAST TECHNIQUE: Multiplanar, multiecho pulse sequences of the brain and surrounding structures were obtained without and with intravenous contrast. CONTRAST:  15m MULTIHANCE GADOBENATE DIMEGLUMINE 529 MG/ML IV SOLN COMPARISON:  Head MRI  01/14/2022 FINDINGS: Brain: There is no evidence of an acute infarct, midline shift, or extra-axial fluid collection. The ventricles and sulci are within normal limits for age. Chronic microhemorrhages in the right corona radiata and medial left cerebellar hemisphere are unchanged. T2 hyperintensities in the cerebral white matter bilaterally are unchanged and nonspecific but compatible with mild chronic small vessel ischemic disease. Two enhancing lesions in the right cerebellar hemisphere have mildly enlarged, now measuring 1.4 cm (series 11, image 51) and 2.0 cm (series 11, image 55) with slight worsening of mild edema but no significant mass effect. No new additional enhancing brain lesions are identified. Vascular: Major intracranial vascular flow voids are preserved. Skull and upper cervical spine: Unchanged enhancing skull base lesion in the right occipital condyle and clivus with involvement of the hypoglossal canal and carotid canal. Sinuses/Orbits: Unremarkable orbits. Clear paranasal sinuses. Trace bilateral mastoid fluid. Other: Unchanged 1.6 cm enhancing left parietal scalp mass, indeterminate. IMPRESSION: 1. Mild enlargement of two right cerebellar metastases with slight worsening of mild edema. No additional brain metastases identified. 2. Unchanged right skull base metastasis. Electronically Signed   By: ALogan BoresM.D.   On: 02/06/2022 08:50   NM Bone Scan Whole Body  Result Date: 01/30/2022 CLINICAL DATA:  Renal cell carcinoma EXAM: NUCLEAR MEDICINE WHOLE BODY BONE SCAN TECHNIQUE: Whole body anterior and posterior images were obtained approximately 3 hours after intravenous injection of radiopharmaceutical. RADIOPHARMACEUTICALS:  20.9 mCi Technetium-92mDP IV COMPARISON:  Same day CT. FINDINGS: Photopenia in the right renal fossa compatible with prior nephrectomy. There is left renal uptake with excretion. Heterogeneous uptake within the axial and appendicular skeleton with areas of more  focal uptake including within the left scapula, bilateral ribs, and thoracolumbar spine. Additional small focus of activity within the region of the left calvarium or scalp. IMPRESSION: Findings compatible with known widespread osseous metastatic disease. Scintigraphic activity is significantly less than expected given the size and number of metastatic lesions seen by concurrently obtained CT. Electronically Signed   By: NiDavina Poke.O.   On: 01/30/2022 14:00   CT CHEST ABDOMEN PELVIS W CONTRAST  Result Date: 01/30/2022 CLINICAL DATA:  Renal cell carcinoma, staging evaluation in this [age]ear old male * Tracking Code: BO * EXAM: CT CHEST, ABDOMEN, AND PELVIS WITH CONTRAST TECHNIQUE: Multidetector CT imaging of the chest, abdomen and pelvis was performed following the standard protocol during bolus administration of intravenous contrast. RADIATION DOSE REDUCTION: This exam was performed according to the departmental dose-optimization program which includes automated exposure control, adjustment of the mA and/or kV according to patient size and/or use of iterative reconstruction technique. CONTRAST:  10010mMNIPAQUE IOHEXOL 300 MG/ML  SOLN COMPARISON:  CT chest, abdomen and pelvis from 2018. FINDINGS: CT CHEST FINDINGS Cardiovascular: Normal heart size. Normal caliber of the thoracic aorta. Scattered aortic atherosclerosis. Central pulmonary arteries are unremarkable on venous phase. Mediastinum/Nodes: No thoracic inlet lymphadenopathy. No axillary lymphadenopathy. No mediastinal lymphadenopathy. RIGHT infrahilar mass or adenopathy leading to collapse of the RIGHT lower lobe, see below. Lungs/Pleura: Pulmonary emphysema. Scattered tiny pulmonary nodules which are innumerable in both the LEFT and RIGHT chest., for example on image  119/3 there is a 5 mm LEFT lower lobe nodule with least 20 additional LEFT lower lobe nodules none greater than 1 cm. RIGHT upper lobe pulmonary nodule (image 79/3) this is the  largest nodule aside from the central mass in the RIGHT chest at 9 mm size. Similar size and distribution of nodules throughout the LEFT upper lobe. Collapse of the RIGHT lower lobe with central convex margins measuring up to 5.7 x 5.6 cm,. No pleural effusion with near complete collapse of the RIGHT lower lobe. Airways in the chest are otherwise patent. Musculoskeletal: See below for full musculoskeletal details with numerous destructive lesions throughout the bony thorax and throughout the entire spine and pelvis. CT ABDOMEN PELVIS FINDINGS Hepatobiliary: Hepatic metastatic disease affecting both LEFT and RIGHT hepatic lobes. (Image 54/2) 12 mm LEFT hepatic lobe lesion. (Image 64/2) 18 mm RIGHT hepatic lobe lesion. At least 4 additional lesions in the liver. Hepatic steatosis in the background. No pericholecystic stranding. No biliary duct dilation. Portal vein is patent.  Hepatic veins are patent. Pancreas: Normal, without mass, inflammation or ductal dilatation. Spleen: Normal. Adrenals/Urinary Tract: Post RIGHT nephrectomy. Adrenal glands are normal. Lesion in the LEFT kidney, upper pole, suspicious for solid renal neoplasm (image 60/2) also seen on image 12/8 at approximately 2 cm. Additional potential solid lesion and or metastatic lesion at 7 mm (image 15/8) Third lesion in the LEFT kidney in the lower pole measuring 2.4 cm (image 25/8). Nephrolithiasis in the lower pole with 4-5 mm calculus in this area. No ureteral calculi or hydronephrosis. Stomach/Bowel: Large volume of stool in the rectum is well-formed, no perirectal stranding or signs of bowel obstruction. Moderate volume of stool elsewhere. No acute gastric or small bowel process. Vascular/Lymphatic: Scattered small lymph nodes throughout the retroperitoneum along the LEFT para-aortic chain largest approximately 10 mm (image 74/2) these are new since previous imaging from 2018. No additional upper abdominal or retroperitoneal lymphadenopathy. RIGHT  groin adenopathy with heterogeneous enhancement (image 111/2) 18 mm short axis smaller adjacent lymph node (image 107/2) 12 mm short axis. Slight enlargement of contralateral lymph nodes but without the degree of heterogeneity seen on the RIGHT. Skin thickening of the RIGHT hemi scrotum and RIGHT groin (image 123/2) Reproductive: LEFT hydrocele incompletely evaluated, imaged on previous study. Other: No ascites.  No pneumoperitoneum. Musculoskeletal: Destruction of LEFT scapula as seen on recent MR imaging, previously biopsied. Multifocal bony metastases and intramuscular metastatic lesions. Nearly every level of the spine is involved. Numerous lytic foci throughout the spine. Lesions with notable features of posterior cortical destruction and or frank soft tissue. (Image 50/2) 19 x 17 mm lesion associated with superior endplate pathologic fracture and likely pathologic fracture of the RIGHT T12 pedicle shows soft tissue extension into the central canal. 20-30% loss of height at this level. Pathologic fracture of the superior endplate of L3 with posterior cortical displacement which is mild and associated with soft tissue density extending into the anterior central canal causing moderate central canal narrowing. RIGHT S1 lesion with frank destruction of the S1 level on the RIGHT extending in the sacral ala measuring 4.3 x 3.2 cm greatest axial dimension. Pathologic fractures of indeterminate chronicity involving T5 with approximately 10-20% loss of height and T10 with approximately 30-40% loss of height. At T10 there is subtle soft tissue along the posterior vertebral margin. Destructive pelvic lesion (image 93/2) 2.7 cm soft tissue lesion destroying LEFT lateral iliac bone. Multifocal lytic lesions in the ribs are mainly small. Expansile component of rib destruction of  the RIGHT posterior sixth rib and RIGHT 6 transverse process with a small amount of extrapleural soft tissue (image 23/2) this measures 2 cm.  Sternal manubrial lesion measuring 15 mm with measurable soft tissue and sternal destruction. Scattered lytic foci in the RIGHT scapula. Other small scattered lytic pelvic lesions. Heterogeneous appearance of marrow space of RIGHT and LEFT proximal femur also raising the question of early metastatic disease to this area, endosteal scalloping seen on image 120/2 as an example. Intramuscular lesions of LEFT gluteal musculature (image 105/2 and image 96/2. RIGHT erector spinae musculature with involvement as well. Additional focus along the chest wall/upper abdomen in the upper rectus muscle (image 13/4). IMPRESSION: Signs of extensive bony metastatic disease with areas of pathologic fracture in the spine which are age indeterminate but new compared to previous imaging. Posterior cortical disruption and central canal extension greatest at T12 and L3 as discussed. Spinal MRI could be considered for further evaluation as clinically warranted. Additional involvement of pelvis, ribs and sternum as well as bilateral scapulae Pulmonary metastatic, nodal and hepatic metastatic disease. Contralateral, LEFT renal lesions either metastatic or new primary renal cell carcinoma with signs of prior RIGHT nephrectomy. Intramuscular metastatic lesions. Distended rectum full of formed stool, correlate with any symptoms of fecal impaction. No signs of obstruction currently. These results will be called to the ordering clinician or representative by the Radiologist Assistant, and communication documented in the PACS or [employer]. Electronically Signed   By: Zetta Bills M.D.   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  PT1b PNx. - He was followed by  Dr. Alen Blew, last seen on 05/08/2017. - He reported left shoulder pain for the last couple of years. - He was evaluated by Dr. Chauncey Reading and then will and MRI was ordered. - Left shoulder MRI (12/23/2021): Large heterogeneous mass lesion with soft tissue extension emanating from scapula.  Multiple rib lesions and lesion in the humerus. - He was evaluated by Dr. Tammi Klippel on 12/29/2021 and was recommended palliative XRT. - CT CAP (01/30/2022): Extensive bony metastatic disease, posterior cortical disruption and central canal extension greatest at T12 and L3.  Small pulmonary nodules bilaterally.  Hepatic metastatic disease.  Left renal lesions either metastatic or new primary. - Bone scan (01/30/2022): Findings consistent with widespread bony metastatic disease. - IMDC intermediate risk group - Ipilimumab and nivolumab cycle 1 started on 02/08/2022. - SRS of the brain lesions on 02/12/2022    Social/family history: - He is seen today with his girlfriend.  He worked as Clinical biochemist for 46 years.  He is a current active smoker, currently smoking half pack per day.  Previously smoked up to 2 packs/day for 50 years. - Mother had throat cancer.  2 paternal uncles and 1 paternal aunt had cancers.   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  He had diarrhea for couple of days which subsided. - Reviewed labs today which showed normal LFTs and creatinine.  CBC was grossly normal.  TSH was 2.8. - He will proceed with cycle 2 immunotherapy today. - I have recommended MRI of the thoracic and lumbar spine based on previous scan findings. - RTC 3  weeks for follow-up for cycle 3.   2.  Left shoulder pain: - 3 more radiation treatments are left for the left shoulder. - Continue oxycodone 10 mg every 4-6 hours as needed.  3.  Weight loss: - Etiology of weight loss is poor pain control. - Continue to drink Premier  protein 1.5-2 cans/day. - We will start him on Megace 400 mg twice daily.  4.  Bone metastasis: - Continue Zometa every 4 weeks.  Calcium is 8.6.   Orders placed this encounter:  No orders of the defined types were placed in this encounter.    Derek Jack, MD [place] [phone]   I, Thana Ates, am acting as a scribe for Dr. Derek Jack.  I, Derek Jack MD, have reviewed the above documentation for accuracy and completeness, and I agree with the above.

## 2022-03-02 ENCOUNTER — Other Ambulatory Visit: Payer: Self-pay

## 2022-03-02 ENCOUNTER — Ambulatory Visit
Admission: RE | Admit: 2022-03-02 | Discharge: 2022-03-02 | Disposition: A | Discharge status: Home / Self Care | Payer: Medicare Other | Source: Ambulatory Visit | Attending: Radiation Oncology | Admitting: Radiation Oncology

## 2022-03-02 DIAGNOSIS — Z510 Encounter for antineoplastic radiation therapy: Secondary | ICD-10-CM | POA: Diagnosis not present

## 2022-03-02 LAB — RAD ONC ARIA SESSION SUMMARY
Course Elapsed Days: 14
Plan Fractions Treated to Date: 7
Plan Prescribed Dose Per Fraction: 3 Gy
Plan Total Fractions Prescribed: 10
Plan Total Prescribed Dose: 30 Gy
Reference Point Dosage Given to Date: 21 Gy
Reference Point Session Dosage Given: 3 Gy
Session Number: 8

## 2022-03-05 ENCOUNTER — Ambulatory Visit: Payer: Medicare Other

## 2022-03-05 ENCOUNTER — Other Ambulatory Visit: Payer: Self-pay

## 2022-03-05 ENCOUNTER — Encounter (HOSPITAL_COMMUNITY): Payer: Self-pay | Admitting: *Deleted

## 2022-03-05 ENCOUNTER — Emergency Department (HOSPITAL_COMMUNITY): Payer: Medicare Other

## 2022-03-05 ENCOUNTER — Ambulatory Visit
Admission: RE | Admit: 2022-03-05 | Discharge: 2022-03-05 | Disposition: A | Discharge status: Home / Self Care | Payer: Medicare Other | Source: Ambulatory Visit | Attending: Radiation Oncology | Admitting: Radiation Oncology

## 2022-03-05 ENCOUNTER — Emergency Department (HOSPITAL_COMMUNITY)
Admission: EM | Admit: 2022-03-05 | Discharge: 2022-03-05 | Disposition: A | Discharge status: Home / Self Care | Payer: Medicare Other | Attending: Emergency Medicine | Admitting: Emergency Medicine

## 2022-03-05 DIAGNOSIS — C7900 Secondary malignant neoplasm of unspecified kidney and renal pelvis: Secondary | ICD-10-CM | POA: Insufficient documentation

## 2022-03-05 DIAGNOSIS — W1839XA Other fall on same level, initial encounter: Secondary | ICD-10-CM | POA: Insufficient documentation

## 2022-03-05 DIAGNOSIS — D649 Anemia, unspecified: Secondary | ICD-10-CM

## 2022-03-05 DIAGNOSIS — C7951 Secondary malignant neoplasm of bone: Secondary | ICD-10-CM | POA: Insufficient documentation

## 2022-03-05 DIAGNOSIS — C799 Secondary malignant neoplasm of unspecified site: Secondary | ICD-10-CM

## 2022-03-05 DIAGNOSIS — S51011A Laceration without foreign body of right elbow, initial encounter: Secondary | ICD-10-CM

## 2022-03-05 DIAGNOSIS — C649 Malignant neoplasm of unspecified kidney, except renal pelvis: Secondary | ICD-10-CM | POA: Insufficient documentation

## 2022-03-05 DIAGNOSIS — W19XXXA Unspecified fall, initial encounter: Secondary | ICD-10-CM

## 2022-03-05 DIAGNOSIS — E871 Hypo-osmolality and hyponatremia: Secondary | ICD-10-CM | POA: Diagnosis not present

## 2022-03-05 DIAGNOSIS — S59901A Unspecified injury of right elbow, initial encounter: Secondary | ICD-10-CM | POA: Diagnosis present

## 2022-03-05 LAB — BASIC METABOLIC PANEL
Anion gap: 9 (ref 5–15)
BUN: 16 mg/dL (ref 8–23)
CO2: 25 mmol/L (ref 22–32)
Calcium: 8.7 mg/dL — ABNORMAL LOW (ref 8.9–10.3)
Chloride: 99 mmol/L (ref 98–111)
Creatinine, Ser: 0.77 mg/dL (ref 0.61–1.24)
GFR, Estimated: >60 mL/min (ref >60)
Glucose, Bld: 115 mg/dL — ABNORMAL HIGH (ref 70–99)
Potassium: 4.1 mmol/L (ref 3.5–5.1)
Sodium: 133 mmol/L — ABNORMAL LOW (ref 135–145)

## 2022-03-05 LAB — RAD ONC ARIA SESSION SUMMARY
Course Elapsed Days: 17
Plan Fractions Treated to Date: 8
Plan Prescribed Dose Per Fraction: 3 Gy
Plan Total Fractions Prescribed: 10
Plan Total Prescribed Dose: 30 Gy
Reference Point Dosage Given to Date: 24 Gy
Reference Point Session Dosage Given: 3 Gy
Session Number: 9

## 2022-03-05 LAB — CBC
HCT: 35.7 % — ABNORMAL LOW (ref 39.0–52.0)
Hemoglobin: 11.5 g/dL — ABNORMAL LOW (ref 13.0–17.0)
MCH: 26.3 pg (ref 26.0–34.0)
MCHC: 32.2 g/dL (ref 30.0–36.0)
MCV: 81.7 fL (ref 80.0–100.0)
Platelets: 279 10*3/uL (ref 150–400)
RBC: 4.37 MIL/uL (ref 4.22–5.81)
RDW: 14.4 % (ref 11.5–15.5)
WBC: 9.1 10*3/uL (ref 4.0–10.5)
nRBC: 0 % (ref 0.0–0.2)

## 2022-03-05 MED ORDER — SODIUM CHLORIDE 0.9 % IV BOLUS
1000.0000 mL | Freq: Once | INTRAVENOUS | Status: AC
  Administered 2022-03-05: 1000 mL via INTRAVENOUS

## 2022-03-05 MED ORDER — HEPARIN SOD (PORK) LOCK FLUSH 100 UNIT/ML IV SOLN
500.0000 [IU] | Freq: Once | INTRAVENOUS | Status: AC
  Administered 2022-03-05: 500 [IU]
  Filled 2022-03-05: qty 5

## 2022-03-05 NOTE — ED Triage Notes (Signed)
Pt with CA in pelvis, left shoulder and spine.  Reported that pt fell x 2 this morning. Pt is receiving chemo and radiation.

## 2022-03-05 NOTE — ED Provider Notes (Signed)
[facility] Provider Note   CSN: [id number] Arrival date & time: 03/05/22  1513     History  Chief Complaint  Patient presents with   Gregory [name]. is a 66 y.o. male.   Fall  Patient has a history of metastatic renal cell carcinoma patient is undergoing chemotherapy and radiation treatment.  Patient states he had a fall this morning when his left knee give way on him.  Patient states he was able to get up with assistance.  He was able to get up and walk around since that time.  He had an appointment this morning for chemo and radiation so we went had that done.  Patient did sustain an injury to his right elbow.  There is a small laceration to his elbow continues to cause some pain and discomfort.  He decided to come to the ED to get checked out after completing his treatment this afternoon.  He denies any fevers.  He is not feeling weak.  He denies any vomiting or diarrhea.  He is not having a headache.  No back pain.  No chest pain.  No pain in his extremities other than his right elbow     Home Medications Prior to Admission medications   Medication Sig Start Date End Date Taking? Authorizing Provider  Ascorbic Acid (VITAMIN C PO) Take 2 tablets by mouth daily. Patient not taking: Reported on 03/01/2022    [provider]  Biotin 2500 MCG CAPS Take 5,000 mcg by mouth daily. Patient not taking: Reported on 03/01/2022    [provider]  Cholecalciferol (VITAMIN D) 50 MCG (2000 UT) tablet Take 4,000 Units by mouth daily. Patient not taking: Reported on 03/01/2022    [provider]  Cyanocobalamin (B-12) 3000 MCG CAPS Take 6,000 mcg by mouth daily. Patient not taking: Reported on 03/01/2022    [provider]  dexamethasone (DECADRON) 4 MG tablet Take 1 tablet (4 mg total) by mouth daily. 02/26/22   Derek Jack, MD  ELDERBERRY PO Take 2 capsules by mouth daily. Patient not taking: Reported on 03/01/2022     [provider]  fentaNYL (DURAGESIC) 50 MCG/HR Place 1 patch onto the skin every 3 (three) days. 01/18/22   Derek Jack, MD  gabapentin (NEURONTIN) 100 MG capsule Take 1 capsule (100 mg total) by mouth 3 (three) times daily. Patient not taking: Reported on 03/01/2022 02/26/22   Derek Jack, MD  Ipilimumab (YERVOY IV) Inject into the vein every 21 ( twenty-one) days. X 4 cycles 02/08/22   [provider]  lidocaine-prilocaine (EMLA) cream Apply a small amount to port a cath site and cover with plastic wrap 1 hour prior to infusion appointments Patient not taking: Reported on 03/01/2022 02/04/22   Derek Jack, MD  LORazepam (ATIVAN) 1 MG tablet Take 1 tablet (1 mg total) by mouth every 8 (eight) hours as needed for anxiety (30 min before MRI or radiation planning/treatment). Patient not taking: Reported on 03/01/2022 02/15/22   Tyler Pita, MD  megestrol (MEGACE) 400 MG/10ML suspension Take 10 mLs (400 mg total) by mouth 2 (two) times daily. 03/01/22   Derek Jack, MD  Nivolumab (OPDIVO IV) Inject into the vein every 21 ( twenty-one) days. 02/08/22   [provider]  Oxycodone HCl 10 MG TABS Take 1 tablet (10 mg total) by mouth every 4 (four) hours as needed. 02/19/22   Derek Jack, MD  senna-docusate (SENOKOT-S) 8.6-50 MG tablet Take 1 tablet  by mouth 2 (two) times daily.    [provider]      Allergies    Patient has no known allergies.    Review of Systems   Review of Systems  Physical Exam Updated Vital Signs BP 114/76   Pulse 74   Temp 98 F (36.7 C) (Oral)   Resp 15   Ht 1.645 m (5' 4.75")   Wt 56.7 kg   SpO2 98%   BMI 20.96 kg/m  Physical Exam Vitals and nursing note reviewed.  Constitutional:      Appearance: He is well-developed. He is not diaphoretic.  HENT:     Head: Normocephalic and atraumatic.     Right Ear: External ear normal.     Left Ear: External ear normal.  Eyes:     General: No  scleral icterus.       Right eye: No discharge.        Left eye: No discharge.     Conjunctiva/sclera: Conjunctivae normal.  Neck:     Trachea: No tracheal deviation.  Cardiovascular:     Rate and Rhythm: Normal rate and regular rhythm.  Pulmonary:     Effort: Pulmonary effort is normal. No respiratory distress.     Breath sounds: Normal breath sounds. No stridor. No wheezing or rales.  Abdominal:     General: Bowel sounds are normal. There is no distension.     Palpations: Abdomen is soft.     Tenderness: There is no abdominal tenderness. There is no guarding or rebound.  Musculoskeletal:        General: Tenderness present. No deformity.     Right elbow: Laceration present. Tenderness present.     Cervical back: Neck supple.     Comments: No tenderness palpation in the pelvis, no tenderness in the cervical thoracic or lumbar spine, no tenderness in his extremities other than the right elbow, small less than 1 cm well approximated laceration right elbow  Skin:    General: Skin is warm and dry.     Findings: No rash.  Neurological:     General: No focal deficit present.     Mental Status: He is alert.     Cranial Nerves: No cranial nerve deficit (no facial droop, extraocular movements intact, no slurred speech).     Sensory: No sensory deficit.     Motor: No abnormal muscle tone or seizure activity.     Coordination: Coordination normal.  Psychiatric:        Mood and Affect: Mood normal.     ED Results / Procedures / Treatments   Labs (all labs ordered are listed, but only abnormal results are displayed) Labs Reviewed  CBC - Abnormal; Notable for the following components:      Result Value   Hemoglobin 11.5 (*)    HCT 35.7 (*)    All other components within normal limits  BASIC METABOLIC PANEL - Abnormal; Notable for the following components:   Sodium 133 (*)    Glucose, Bld 115 (*)    Calcium 8.7 (*)    All other components within normal limits    EKG EKG  Interpretation  Date/Time:  Monday March 05 2022 16:14:21 EDT Ventricular Rate:  77 PR Interval:  129 QRS Duration: 97 QT Interval:  382 QTC Calculation: 433 R Axis:   -47 Text Interpretation: Sinus rhythm Left axis deviation RSR' in V1 or V2, right VCD or RVH Confirmed by Dorie Rank [phone]) on 03/05/2022 4:50:15 PM  Radiology  DG Elbow Complete Right  Result Date: 03/05/2022 CLINICAL DATA:  Fall with elbow pain EXAM: RIGHT ELBOW - COMPLETE 3+ VIEW COMPARISON:  None Available. FINDINGS: No fracture or malalignment. No elbow effusion. Small amount of gas at the posterior elbow. IMPRESSION: 1. No acute osseous abnormality 2. Small amount of soft tissue gas at the posterior elbow could be related to laceration, correlate with physical exam. Electronically Signed   By: Donavan Foil M.D.   On: 03/05/2022 16:21    Procedures .Marland KitchenLaceration Repair  Date/Time: 03/05/2022 5:30 PM  Performed by: Dorie Rank, MD Authorized by: Dorie Rank, MD   Consent:    Consent obtained:  Verbal   Consent given by:  Patient   Risks discussed:  Infection, need for additional repair, pain, poor cosmetic result and poor wound healing   Alternatives discussed:  No treatment and delayed treatment Universal protocol:    Procedure explained and questions answered to patient or proxy's satisfaction: yes     Immediately prior to procedure, a time out was called: yes     Patient identity confirmed:  Verbally with patient Anesthesia:    Anesthesia method:  None Laceration details:    Location: right elbow.   Length (cm):  0.5 Pre-procedure details:    Preparation:  Patient was prepped and draped in usual sterile fashion Treatment:    Area cleansed with:  Saline   Amount of cleaning:  Standard Skin repair:    Repair method:  Tissue adhesive Approximation:    Approximation:  Close Repair type:    Repair type:  Simple     Medications Ordered in ED Medications  heparin lock flush 100 unit/mL (has no  administration in time range)  sodium chloride 0.9 % bolus 1,000 mL (1,000 mLs Intravenous New Bag/Given 03/05/22 1651)    ED Course/ Medical Decision Making/ A&P Clinical Course as of 03/05/22 1813  Mon Mar 05, 2022  1730 CBC(!) Hemoglobin decreased compared to previous [JK]  1730 DG Elbow Complete Right X-ray images and radiology report reviewed.  No signs of fracture or dislocation, gas noted but patient does have a laceration that area [JK]  1517 Basic metabolic panel(!) Hyponatremia noted.  Metabolic panel shows stable sodium level, calcium level stable [JK]    Clinical Course User Index [JK] Dorie Rank, MD                           Medical Decision Making Problems Addressed: Anemia, unspecified type: chronic illness or injury with exacerbation, progression, or side effects of treatment Fall, initial encounter: acute illness or injury that poses a threat to life or bodily functions Hypocalcemia: chronic illness or injury Hyponatremia: chronic illness or injury Laceration of right elbow, initial encounter: acute illness or injury  Amount and/or Complexity of Data Reviewed External Data Reviewed: notes.    Details: Outpatient oncology notes reviewed Labs: ordered. Decision-making details documented in ED Course. Radiology: ordered and independent interpretation performed. Decision-making details documented in ED Course.  Patient presented to the ED for evaluation after fall.  No signs of severe dehydration.  No new significant electrolyte abnormalities noted.  No signs of acute infection.  Patient has remained hemodynamically stable.  No hypotension..  Patient has been able to walk since his fall.  X-rays do not show any signs of fracture or dislocation.  Wound was treated without difficulty.  Labs do show slightly worsening anemia.  Patient denies any acute bleeding.  Suspect this is related to  his chemotherapy treatments.  Recommended close observation and to return to the ED for  any recurrent bleeding.  Patient noted to have persistent hyponatremia and hypocalcemia but this is unchanged from previous and I doubt it is causing any acute issues.  Patient feels comfortable enough to go home.  He is anxious to leave.  Evaluation and diagnostic testing in the emergency department does not suggest an emergent condition requiring admission or immediate intervention beyond what has been performed at this time.  The patient is safe for discharge and has been instructed to return immediately for worsening symptoms, change in symptoms or any other concerns.      Final Clinical Impression(s) / ED Diagnoses Final diagnoses:  Fall, initial encounter  Laceration of right elbow, initial encounter  Anemia, unspecified type  Hyponatremia  Hypocalcemia  Metastatic malignant neoplasm, unspecified site [facility])    Rx / DC Orders ED Discharge Orders     None         Dorie Rank, MD 03/05/22 [phone]

## 2022-03-05 NOTE — ED Notes (Signed)
Wound cleaned and dressed. MD aware and states that he will be to bedside to apply Dermabond.

## 2022-03-05 NOTE — Discharge Instructions (Signed)
Your electrolytes showed that your sodium and calcium levels are still slightly lower than normal but this is no significant difference compared to previous tests.  Your blood count is decreasing compared to previous values.  Monitor for any signs of bleeding.  Continue to follow-up with your cancer team as planned.  The Dermabond should eventually wear off in the next week or 2.  Avoid putting any antibiotic ointment on the wound.  Keep a dressing over the wound to keep it clean and dry.

## 2022-03-06 ENCOUNTER — Ambulatory Visit: Payer: Medicare Other

## 2022-03-07 ENCOUNTER — Ambulatory Visit
Admission: RE | Admit: 2022-03-07 | Discharge: 2022-03-07 | Disposition: A | Discharge status: Home / Self Care | Payer: Medicare Other | Source: Ambulatory Visit | Attending: Radiation Oncology | Admitting: Radiation Oncology

## 2022-03-07 ENCOUNTER — Other Ambulatory Visit: Payer: Self-pay

## 2022-03-07 ENCOUNTER — Ambulatory Visit: Payer: Medicare Other

## 2022-03-07 DIAGNOSIS — C7951 Secondary malignant neoplasm of bone: Secondary | ICD-10-CM | POA: Insufficient documentation

## 2022-03-07 DIAGNOSIS — C649 Malignant neoplasm of unspecified kidney, except renal pelvis: Secondary | ICD-10-CM | POA: Insufficient documentation

## 2022-03-07 LAB — RAD ONC ARIA SESSION SUMMARY
Course Elapsed Days: 19
Plan Fractions Treated to Date: 9
Plan Prescribed Dose Per Fraction: 3 Gy
Plan Total Fractions Prescribed: 10
Plan Total Prescribed Dose: 30 Gy
Reference Point Dosage Given to Date: 27 Gy
Reference Point Session Dosage Given: 3 Gy
Session Number: 10

## 2022-03-08 ENCOUNTER — Other Ambulatory Visit: Payer: Self-pay

## 2022-03-08 ENCOUNTER — Encounter: Payer: Self-pay | Admitting: Urology

## 2022-03-08 ENCOUNTER — Encounter: Payer: Self-pay | Admitting: Radiation Oncology

## 2022-03-08 ENCOUNTER — Ambulatory Visit
Admission: RE | Admit: 2022-03-08 | Discharge: 2022-03-08 | Disposition: A | Discharge status: Home / Self Care | Payer: Medicare Other | Source: Ambulatory Visit | Attending: Radiation Oncology | Admitting: Radiation Oncology

## 2022-03-08 DIAGNOSIS — C7951 Secondary malignant neoplasm of bone: Secondary | ICD-10-CM | POA: Diagnosis not present

## 2022-03-08 LAB — RAD ONC ARIA SESSION SUMMARY
Course Elapsed Days: 20
Plan Fractions Treated to Date: 10
Plan Prescribed Dose Per Fraction: 3 Gy
Plan Total Fractions Prescribed: 10
Plan Total Prescribed Dose: 30 Gy
Reference Point Dosage Given to Date: 30 Gy
Reference Point Session Dosage Given: 3 Gy
Session Number: 11

## 2022-03-09 ENCOUNTER — Encounter (HOSPITAL_COMMUNITY): Payer: Self-pay | Admitting: Hematology

## 2022-03-13 ENCOUNTER — Other Ambulatory Visit (HOSPITAL_COMMUNITY): Payer: Self-pay

## 2022-03-13 MED ORDER — LORAZEPAM 1 MG PO TABS: 1.0000 mg | ORAL_TABLET | Freq: Every evening | ORAL | 0 refills | Status: DC | PRN

## 2022-03-14 ENCOUNTER — Ambulatory Visit
Admission: RE | Admit: 2022-03-14 | Discharge: 2022-03-14 | Disposition: A | Discharge status: Home / Self Care | Payer: Medicare Other | Source: Ambulatory Visit | Attending: Hematology | Admitting: Hematology

## 2022-03-14 DIAGNOSIS — C649 Malignant neoplasm of unspecified kidney, except renal pelvis: Secondary | ICD-10-CM

## 2022-03-14 MED ORDER — GADOBENATE DIMEGLUMINE 529 MG/ML IV SOLN
14.0000 mL | Freq: Once | INTRAVENOUS | Status: AC | PRN
  Administered 2022-03-14: 14 mL via INTRAVENOUS

## 2022-03-22 ENCOUNTER — Inpatient Hospital Stay (HOSPITAL_COMMUNITY): Payer: Medicare Other | Attending: Hematology

## 2022-03-22 ENCOUNTER — Inpatient Hospital Stay (HOSPITAL_BASED_OUTPATIENT_CLINIC_OR_DEPARTMENT_OTHER): Payer: Medicare Other | Admitting: Hematology

## 2022-03-22 ENCOUNTER — Other Ambulatory Visit: Payer: Self-pay

## 2022-03-22 ENCOUNTER — Ambulatory Visit
Admission: RE | Admit: 2022-03-22 | Discharge: 2022-03-22 | Disposition: A | Discharge status: Home / Self Care | Payer: Medicare Other | Source: Ambulatory Visit | Attending: Radiation Oncology | Admitting: Radiation Oncology

## 2022-03-22 ENCOUNTER — Inpatient Hospital Stay (HOSPITAL_COMMUNITY): Payer: Medicare Other

## 2022-03-22 ENCOUNTER — Ambulatory Visit (HOSPITAL_COMMUNITY): Payer: Medicare Other | Admitting: Dietician

## 2022-03-22 VITALS — Wt 115.6 lb

## 2022-03-22 VITALS — BP 116/75 | HR 81 | Temp 98.5°F | Resp 18

## 2022-03-22 DIAGNOSIS — Z5112 Encounter for antineoplastic immunotherapy: Secondary | ICD-10-CM | POA: Diagnosis present

## 2022-03-22 DIAGNOSIS — Z5111 Encounter for antineoplastic chemotherapy: Secondary | ICD-10-CM | POA: Insufficient documentation

## 2022-03-22 DIAGNOSIS — Z95828 Presence of other vascular implants and grafts: Secondary | ICD-10-CM

## 2022-03-22 DIAGNOSIS — C649 Malignant neoplasm of unspecified kidney, except renal pelvis: Secondary | ICD-10-CM

## 2022-03-22 DIAGNOSIS — Z79899 Other long term (current) drug therapy: Secondary | ICD-10-CM | POA: Diagnosis not present

## 2022-03-22 DIAGNOSIS — C787 Secondary malignant neoplasm of liver and intrahepatic bile duct: Secondary | ICD-10-CM | POA: Diagnosis not present

## 2022-03-22 DIAGNOSIS — C7931 Secondary malignant neoplasm of brain: Secondary | ICD-10-CM

## 2022-03-22 DIAGNOSIS — C7951 Secondary malignant neoplasm of bone: Secondary | ICD-10-CM | POA: Insufficient documentation

## 2022-03-22 DIAGNOSIS — C7800 Secondary malignant neoplasm of unspecified lung: Secondary | ICD-10-CM | POA: Insufficient documentation

## 2022-03-22 DIAGNOSIS — C641 Malignant neoplasm of right kidney, except renal pelvis: Secondary | ICD-10-CM | POA: Insufficient documentation

## 2022-03-22 LAB — CBC WITH DIFFERENTIAL/PLATELET
Abs Immature Granulocytes: 0.12 10*3/uL — ABNORMAL HIGH (ref 0.00–0.07)
Basophils Absolute: 0.1 10*3/uL (ref 0.0–0.1)
Basophils Relative: 1 %
Eosinophils Absolute: 0.8 10*3/uL — ABNORMAL HIGH (ref 0.0–0.5)
Eosinophils Relative: 6 %
HCT: 38.8 % — ABNORMAL LOW (ref 39.0–52.0)
Hemoglobin: 12.6 g/dL — ABNORMAL LOW (ref 13.0–17.0)
Immature Granulocytes: 1 %
Lymphocytes Relative: 4 %
Lymphs Abs: 0.6 10*3/uL — ABNORMAL LOW (ref 0.7–4.0)
MCH: 26.4 pg (ref 26.0–34.0)
MCHC: 32.5 g/dL (ref 30.0–36.0)
MCV: 81.3 fL (ref 80.0–100.0)
Monocytes Absolute: 1.1 10*3/uL — ABNORMAL HIGH (ref 0.1–1.0)
Monocytes Relative: 8 %
Neutro Abs: 10.5 10*3/uL — ABNORMAL HIGH (ref 1.7–7.7)
Neutrophils Relative %: 80 %
Platelets: 259 10*3/uL (ref 150–400)
RBC: 4.77 MIL/uL (ref 4.22–5.81)
RDW: 15.9 % — ABNORMAL HIGH (ref 11.5–15.5)
WBC: 13.2 10*3/uL — ABNORMAL HIGH (ref 4.0–10.5)
nRBC: 0 % (ref 0.0–0.2)

## 2022-03-22 LAB — COMPREHENSIVE METABOLIC PANEL
ALT: 15 U/L (ref 0–44)
AST: 24 U/L (ref 15–41)
Albumin: 3 g/dL — ABNORMAL LOW (ref 3.5–5.0)
Alkaline Phosphatase: 100 U/L (ref 38–126)
Anion gap: 7 (ref 5–15)
BUN: 15 mg/dL (ref 8–23)
CO2: 25 mmol/L (ref 22–32)
Calcium: 8.8 mg/dL — ABNORMAL LOW (ref 8.9–10.3)
Chloride: 102 mmol/L (ref 98–111)
Creatinine, Ser: 0.65 mg/dL (ref 0.61–1.24)
GFR, Estimated: >60 mL/min (ref >60)
Glucose, Bld: 121 mg/dL — ABNORMAL HIGH (ref 70–99)
Potassium: 4.2 mmol/L (ref 3.5–5.1)
Sodium: 134 mmol/L — ABNORMAL LOW (ref 135–145)
Total Bilirubin: 0.5 mg/dL (ref 0.3–1.2)
Total Protein: 6.6 g/dL (ref 6.5–8.1)

## 2022-03-22 LAB — MAGNESIUM: Magnesium: 1.9 mg/dL (ref 1.7–2.4)

## 2022-03-22 LAB — TSH: TSH: 2.21 u[IU]/mL (ref 0.350–4.500)

## 2022-03-22 MED ORDER — SODIUM CHLORIDE 0.9 % IV SOLN: Freq: Once | INTRAVENOUS | Status: AC

## 2022-03-22 MED ORDER — HEPARIN SOD (PORK) LOCK FLUSH 100 UNIT/ML IV SOLN
500.0000 [IU] | Freq: Once | INTRAVENOUS | Status: AC | PRN
  Administered 2022-03-22: 500 [IU]

## 2022-03-22 MED ORDER — FAMOTIDINE IN NACL 20-0.9 MG/50ML-% IV SOLN
20.0000 mg | Freq: Once | INTRAVENOUS | Status: AC
  Administered 2022-03-22: 20 mg via INTRAVENOUS
  Filled 2022-03-22: qty 50

## 2022-03-22 MED ORDER — DIPHENHYDRAMINE HCL 50 MG/ML IJ SOLN
25.0000 mg | Freq: Once | INTRAMUSCULAR | Status: AC
  Administered 2022-03-22: 25 mg via INTRAVENOUS
  Filled 2022-03-22: qty 1

## 2022-03-22 MED ORDER — SODIUM CHLORIDE 0.9 % IV SOLN
50.0000 mg | Freq: Once | INTRAVENOUS | Status: AC
  Administered 2022-03-22: 50 mg via INTRAVENOUS
  Filled 2022-03-22: qty 10

## 2022-03-22 MED ORDER — OXYCODONE HCL 10 MG PO TABS: 10.0000 mg | ORAL_TABLET | ORAL | 0 refills | Status: DC | PRN

## 2022-03-22 MED ORDER — SODIUM CHLORIDE 0.9 % IV SOLN
157.0000 mg | Freq: Once | INTRAVENOUS | Status: AC
  Administered 2022-03-22: 157 mg via INTRAVENOUS
  Filled 2022-03-22: qty 10

## 2022-03-22 MED ORDER — SODIUM CHLORIDE 0.9% FLUSH
10.0000 mL | INTRAVENOUS | Status: DC | PRN
  Administered 2022-03-22: 10 mL

## 2022-03-22 MED ORDER — ZOLEDRONIC ACID 4 MG/100ML IV SOLN
4.0000 mg | Freq: Once | INTRAVENOUS | Status: AC
  Administered 2022-03-22: 4 mg via INTRAVENOUS
  Filled 2022-03-22: qty 100

## 2022-03-22 NOTE — Progress Notes (Signed)
Adjust doses with today's current weight due to >10% loss. Today's weight 52.4 kg  Nivolumab 3 mg/kg = 157 mg Yervoy 1 mg/kg = 50 mg (rounded for vial size)  Henreitta Leber, Pharm D

## 2022-03-22 NOTE — Progress Notes (Signed)
Ipilimumab (YERVOY) Patient Monitoring Assessment   Is the patient experiencing any of the following general symptoms?:  '[]'$ Difficulty performing normal activities '[]'$ Feeling sluggish or cold all the time '[]'$ Unusual weight gain '[]'$ Constant or unusual headaches '[]'$ Feeling dizzy or faint '[]'$ Changes in eyesight (blurry vision, double vision, or other vision problems) '[]'$ Changes in mood or behavior (ex: decreased sex drive, irritability, or forgetfulness) '[]'$ Starting new medications (ex: steroids, other medications that lower immune response) '[x]'$ Patient is not experiencing any of the general symptoms above.    Gastrointestinal  Patient is having 0-1 bowel movements each day.  Is this different from baseline? '[]'$ Yes '[]'$ No Are your stools watery or do they have a foul smell? '[]'$ Yes '[x]'$ No Have you seen blood in your stools? '[]'$ Yes '[x]'$ No Are your stools dark, tarry, or sticky? '[]'$ Yes '[x]'$ No Are you having pain or tenderness in your belly? '[]'$ Yes '[x]'$ No  Skin Does your skin itch? '[]'$ Yes '[x]'$ No Do you have a rash? '[]'$ Yes '[x]'$ No Has your skin blistered and/or peeled? '[]'$ Yes '[x]'$ No Do you have sores in your mouth? '[]'$ Yes '[x]'$ No  Hepatic Has your urine been dark or tea colored? '[]'$ Yes '[x]'$ No Have you noticed that your skin or the whites of your eyes are turning yellow? '[]'$ Yes '[x]'$ No Are you bleeding or bruising more easily than normal? '[]'$ Yes '[x]'$ No Are you nauseous and/or vomiting? '[]'$ Yes '[x]'$ No Do you have pain on the right side of your stomach? '[]'$ Yes '[x]'$ No  Neurologic  Are you having unusual weakness of legs, arms, or face? '[]'$ Yes '[x]'$ No Are you having numbness or tingling in your hands or feet? '[]'$ Yes '[x]'$ No  Steven Veazie D Madalina Rosman  Treatment given today per MD orders. Tolerated infusion without adverse affects. Vital signs stable. No complaints at this time. Discharged from clinic by wheel chair in stable condition. Alert and oriented x 3. F/U with [facility] as scheduled.

## 2022-03-22 NOTE — Patient Instructions (Addendum)
Gregory [name] at [facility] Discharge Instructions   You were seen and examined today by Dr. Delton Coombes.  He reviewed your lab work today which is normal/stable.   You have lost 10 lbs since your last visit. We will refer you to our dietician to discuss tips and tricks to gain weight.   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 We will refer you to radiation for these spots. It is very important especially that you have radiation to T12 because if this starts to compress your spinal cord it could cause paralysis.  We will proceed with your treatment today.  Return as scheduled.    Thank you for choosing [place] at [facility] to provide your oncology and hematology care.  To afford each patient quality time with our provider, please arrive at least 15 minutes before your scheduled appointment time.   If you have a lab appointment with the [facility] please come in thru the Main Entrance and check in at the main information desk.  You need to re-schedule your appointment should you arrive 10 or more minutes late.  We strive to give you quality time with our providers, and arriving late affects you and other patients whose appointments are after yours.  Also, if you no show three or more times for appointments you may be dismissed from the clinic at the providers discretion.     Again, thank you for choosing [facility].  Our hope is that these requests will decrease the amount of time that you wait before being seen by our physicians.       _____________________________________________________________  Should you have questions after your visit to [facility], please contact our office at [phone] and follow the prompts.  Our office hours are 8:00 a.m. and 4:30 p.m. Monday - Friday.  Please note that voicemails left after 4:00 p.m. may not be returned until the following business day.   We are closed weekends and major holidays.  You do have access to a nurse 24-7, just call the main number to the clinic [phone] and do not press any options, hold on the line and a nurse will answer the phone.    For prescription refill requests, have your pharmacy contact our office and allow 72 hours.    Due to Covid, you will need to wear a mask upon entering the hospital. If you do not have a mask, a mask will be given to you at the Main Entrance upon arrival. For doctor visits, patients may have 1 support person age 34 or older with them. For treatment visits, patients can not have anyone with them due to social distancing guidelines and our immunocompromised population.

## 2022-03-22 NOTE — Progress Notes (Signed)
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 There is involvement of the medial clivus, occipital condyle, petrous apex, with extension into the RIGHT cavernous sinus and sphenoid sinus...   10/21/2015 Imaging   CT abd/pelvis- Enlarged irregular enhancing mass partially exophytic from the RIGHT kidney consistent with RENAL CELL CARCINOMA. No evidence of involvement of the RIGHT renal vein.No retroperitoneal lymphadenopathy.   10/21/2015 Imaging   CT chest- No evidence thoracic metastasis. Nodule along the RIGHT oblique fissure likely represents a benign intrafissural lymph node   10/21/2015 Imaging   MRI brain- Destructive right central/posterior skullbase mass consistent with metastasis as previously described.   10/23/2015 Initial Diagnosis   Renal cell carcinoma ([place])   11/01/2015 Procedure   US guided biopsy by IR   11/02/2015 Pathology Results   CLEAR CELL RENAL CELL CARCINOMA, WHO NUCLEAR GRADE 2   Metastatic renal cell carcinoma to bone ([place])  11/06/2015 Initial Diagnosis   Metastatic renal cell carcinoma to bone ([place])   02/08/2022 -  Chemotherapy   Patient is on Treatment Plan : RENAL CELL CARCINOMA Nivolumab + Ipilimumab q21d / Nivolumab q28d       CANCER STAGING:  Cancer Staging  Metastatic renal cell carcinoma to bone [facility]) Staging form: Kidney, AJCC 8th Edition - Clinical stage from 01/03/2022: Stage IV (cT1b, cNX, pM1) - Unsigned   INTERVAL HISTORY:  [name], a 66 y.o. male,  returns for routine follow-up and consideration for next cycle of chemotherapy. Gregory [name] was last seen on 03/09/2022.  Due for cycle #3 of Ipilimumab and nivolumab today.   Overall, he tells me he has been feeling pretty well. He reports pain in his buttock and left leg. He has lost 10 lbs since 7/3. He reports poor sleep which has been helped by Gabapentin. He reports the pain in his left shoulder has improved. He spends most of the day laying on the couch. He denies back pain. He reports he is eating well. He had mild diarrhea and fatigue for 2 days following his last treatment. He has not been drinking Premier protein consistently. He has not tried Megace as he reports he does not like medicine. He denies cough and SOB.   Overall, he feels ready for next cycle of chemo today.    REVIEW OF SYSTEMS:  Review of Systems  Constitutional:  Positive for unexpected weight change (-10 lbs). Negative for appetite change and fatigue.  Respiratory:  Negative for cough and shortness of breath.   Gastrointestinal:  Positive for constipation.  Musculoskeletal:  Positive for arthralgias (4/10 L leg and shoulder) and myalgias (4/10 buttock). Negative for back pain.  Neurological:  Positive for numbness (L hand).  Psychiatric/Behavioral:  Positive for sleep disturbance.   All other systems reviewed and are negative.   PAST MEDICAL/SURGICAL HISTORY:  Past Medical History:  Diagnosis Date   Port-A-Cath in place 02/04/2022   Renal cell carcinoma ([place]) 10/23/2015   Skull lesion 10/20/2015   metastatic renal cell carcinoma   Past Surgical History:  Procedure Laterality  Date   PORTACATH PLACEMENT Right 02/23/2022   Procedure: INSERTION PORT-A-CATH;  Surgeon: Aviva Signs, MD;  Location: [facility];  Service: General;  Laterality: Right;   robotic radical right nephrectomy      SOCIAL HISTORY:  Social History   Socioeconomic History   Marital status: Single    Spouse name: Not on file   Number of  children: Not on file   Years of education: Not on file   Highest education level: Not on file  Occupational History   Occupation: Programmer, systems: OTHER    Comment: [employer]  Tobacco Use   Smoking status: Every Day    Packs/day: 0.50    Years: 45.00    Total pack years: 22.50    Types: Cigarettes   Smokeless tobacco: Never  Vaping Use   Vaping Use: Never used  Substance and Sexual Activity   Alcohol use: Not Currently    Alcohol/week: 20.0 standard drinks of alcohol    Types: 20 Shots of liquor per week    Comment: 1-5 shot of liquor per night   Drug use: Yes    Frequency: 1.0 times per week    Comment: occasionally   Sexual activity: Yes  Other Topics Concern   Not on file  Social History Narrative   Not on file   Social Determinants of Health   Financial Resource Strain: Not on file  Food Insecurity: Not on file  Transportation Needs: Not on file  Physical Activity: Not on file  Stress: Not on file  Social Connections: Not on file  Intimate Partner Violence: Not on file    FAMILY HISTORY:  Family History  Problem Relation Age of Onset   CAD Mother    CAD Father    Cancer Paternal Uncle        stomach cancer   Cancer Paternal Uncle        unknown    CURRENT MEDICATIONS:  Current Outpatient Medications  Medication Sig Dispense Refill   Ascorbic Acid (VITAMIN C PO) Take 2 tablets by mouth daily. (Patient not taking: Reported on 03/01/2022)     Biotin 2500 MCG CAPS Take 5,000 mcg by mouth daily. (Patient not taking: Reported on 03/01/2022)     Cholecalciferol (VITAMIN D) 50 MCG (2000 UT) tablet Take 4,000 Units by mouth daily. (Patient not taking: Reported on 03/01/2022)     Cyanocobalamin (B-12) 3000 MCG CAPS Take 6,000 mcg by mouth daily. (Patient not taking: Reported on 03/01/2022)     dexamethasone (DECADRON) 4 MG tablet Take 1 tablet (4 mg total) by mouth daily. 30 tablet 3   ELDERBERRY PO Take 2 capsules by mouth daily. (Patient not taking:  Reported on 03/01/2022)     fentaNYL (DURAGESIC) 50 MCG/HR Place 1 patch onto the skin every 3 (three) days. 5 patch 0   gabapentin (NEURONTIN) 100 MG capsule Take 1 capsule (100 mg total) by mouth 3 (three) times daily. (Patient not taking: Reported on 03/01/2022) 90 capsule 3   Ipilimumab (YERVOY IV) Inject into the vein every 21 ( twenty-one) days. X 4 cycles     lidocaine-prilocaine (EMLA) cream Apply a small amount to port a cath site and cover with plastic wrap 1 hour prior to infusion appointments (Patient not taking: Reported on 03/01/2022) 30 g 3   LORazepam (ATIVAN) 1 MG tablet Take 1 tablet (1 mg total) by mouth at bedtime as needed for anxiety. 30 tablet 0   megestrol (MEGACE) 400 MG/10ML suspension Take  10 mLs (400 mg total) by mouth 2 (two) times daily. 480 mL 3   Nivolumab (OPDIVO IV) Inject into the vein every 21 ( twenty-one) days.     Oxycodone HCl 10 MG TABS Take 1 tablet (10 mg total) by mouth every 4 (four) hours as needed. 168 tablet 0   senna-docusate (SENOKOT-S) 8.6-50 MG tablet Take 1 tablet by mouth 2 (two) times daily.     No current facility-administered medications for this visit.    ALLERGIES:  No Known Allergies  PHYSICAL EXAM:  Performance status (ECOG): 1 - Symptomatic but completely ambulatory  There were no vitals filed for this visit. Wt Readings from Last 3 Encounters:  03/05/22 125 lb (56.7 kg)  03/01/22 126 lb (57.2 kg)  02/20/22 127 lb (57.6 kg)   Physical Exam Vitals reviewed.  Constitutional:      Appearance: Normal appearance.     Comments: In wheelchair  Cardiovascular:     Rate and Rhythm: Normal rate and regular rhythm.     Pulses: Normal pulses.     Heart sounds: Normal heart sounds.  Pulmonary:     Effort: Pulmonary effort is normal.     Breath sounds: Normal breath sounds.  Musculoskeletal:     Right lower leg: No edema.     Left lower leg: No edema.  Neurological:     General: No focal deficit present.     Mental Status: He is  alert and oriented to person, place, and time.  Psychiatric:        Mood and Affect: Mood normal.        Behavior: Behavior normal.     LABORATORY DATA:  I have reviewed the labs as listed.     Latest Ref Rng & Units 03/05/2022    4:44 PM 03/01/2022    8:47 AM 02/08/2022    8:48 AM  CBC  WBC 4.0 - 10.5 K/uL 9.1  9.9  11.9   Hemoglobin 13.0 - 17.0 g/dL 11.5  12.4  14.0   Hematocrit 39.0 - 52.0 % 35.7  38.4  44.3   Platelets 150 - 400 K/uL 279  302  354       Latest Ref Rng & Units 03/05/2022    4:44 PM 03/01/2022    8:47 AM 02/08/2022    8:48 AM  CMP  Glucose 70 - 99 mg/dL 115  123  126   BUN 8 - 23 mg/dL '16  17  18   '$ Creatinine 0.61 - 1.24 mg/dL 0.77  0.74  1.08   Sodium 135 - 145 mmol/L 133  133  137   Potassium 3.5 - 5.1 mmol/L 4.1  4.1  3.8   Chloride 98 - 111 mmol/L 99  101  98   CO2 22 - 32 mmol/L '25  26  28   '$ Calcium 8.9 - 10.3 mg/dL 8.7  8.6  10.4   Total Protein 6.5 - 8.1 g/dL  6.7  7.6   Total Bilirubin 0.3 - 1.2 mg/dL  0.5  0.5   Alkaline Phos 38 - 126 U/L  105  132   AST 15 - 41 U/L  29  29   ALT 0 - 44 U/L  18  17     DIAGNOSTIC IMAGING:  I have independently reviewed the scans and discussed with the patient. MR Lumbar Spine W Wo Contrast  Result Date: 03/16/2022 CLINICAL DATA:  Metastatic renal carcinoma EXAM: MRI LUMBAR SPINE WITHOUT AND WITH CONTRAST TECHNIQUE: Multiplanar and multiecho  pulse sequences of the lumbar spine were obtained without and with intravenous contrast. CONTRAST:  52m MULTIHANCE GADOBENATE DIMEGLUMINE 529 MG/ML IV SOLN COMPARISON:  None Available. FINDINGS: Segmentation:  Standard. Alignment:  No significant listhesis. Vertebrae: Extensive metastatic disease involving included osseous structures. Extraosseous extension is most apparent at T12 and L4. Vertebral body heights are similar to May CT abdomen. Conus medullaris and cauda equina: Conus extends to the L1 level. Conus and cauda equina appear normal. No abnormal intrathecal enhancement.  Paraspinal and other soft tissues: Right nephrectomy. Disc levels: T12-L1: Right ventral and lateral epidural disease at the T12 level effaces the right ventral subarachnoid space and mildly encroaches on the right T12-L1 neural foramen. No canal stenosis at disc level. No left foraminal narrowing. L1-L2: No canal or foraminal stenosis. L2-L3: Disc bulge. Minor canal stenosis. No significant foraminal stenosis. L3-L4: Disc bulge. Facet arthropathy. Mild canal stenosis. No right foraminal stenosis. Mild left foraminal stenosis. Below disc level, there is left ventral epidural disease encroaching on the spinal canal including traversing left L4 nerve root. L4-L5: Disc bulge with small central protrusion. Facet arthropathy. Mild canal stenosis. Partial effacement of subarticular recesses. Mild foraminal stenosis. L5-S1: Disc bulge with superimposed central extrusion extending just below disc level. No canal stenosis. Slight effacement of subarticular recesses. Moderate right and mild left foraminal stenosis. IMPRESSION: Extensive osseous metastatic disease. Vertebral body heights are similar to May CT abdomen. Epidural disease is present and most apparent at T12 and L4 without high-grade canal narrowing. Electronically Signed   By: PMacy MisM.D.   On: 03/16/2022 12:26   DG Elbow Complete Right  Result Date: 03/05/2022 CLINICAL DATA:  Fall with elbow pain EXAM: RIGHT ELBOW - COMPLETE 3+ VIEW COMPARISON:  None Available. FINDINGS: No fracture or malalignment. No elbow effusion. Small amount of gas at the posterior elbow. IMPRESSION: 1. No acute osseous abnormality 2. Small amount of soft tissue gas at the posterior elbow could be related to laceration, correlate with physical exam. Electronically Signed   By: KDonavan FoilM.D.   On: 03/05/2022 16:21   DG Chest Port 1 View  Result Date: 02/23/2022 CLINICAL DATA:  Status post port placement. EXAM: PORTABLE CHEST 1 VIEW COMPARISON:  CT chest, abdomen, and  pelvis 01/30/2022 FINDINGS: A right subclavian Port-A-Cath has been placed and terminates over the lower SVC. The cardiomediastinal silhouette is within normal limits for portable AP technique. A background of pulmonary emphysema is again noted. There are small nodular densities bilaterally with numerous lung nodule shown on the prior CT. There is persistent right lower lobe collapse. No sizable pleural effusion or pneumothorax is identified. Osseous metastatic disease is again noted including a destructive left scapular mass. IMPRESSION: 1. Interval Port-A-Cath placement as above.  No pneumothorax. 2. Known pulmonary and osseous metastatic disease. Persistent right lower lobe collapse. Electronically Signed   By: ALogan BoresM.D.   On: 02/23/2022 10:07   DG C-Arm 1-60 Min-No Report  Result Date: 02/23/2022 Fluoroscopy was utilized by the requesting physician.  No radiographic interpretation.     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  PT1b PNx. - He was followed by Dr. SAlen Blew last seen on 05/08/2017. - He reported left shoulder pain for the last couple of years. - He was evaluated by Dr.  Mahoney and then will and MRI was ordered. - Left shoulder MRI (12/23/2021): Large heterogeneous mass lesion with soft tissue extension emanating from scapula.  Multiple rib lesions and lesion in the humerus. - He was evaluated by Dr. Tammi Klippel on 12/29/2021 and was recommended palliative XRT. - CT CAP (01/30/2022): Extensive bony metastatic disease, posterior cortical disruption and central canal extension greatest at T12 and L3.  Small pulmonary nodules bilaterally.  Hepatic metastatic disease.  Left renal lesions either metastatic or new primary. - Bone scan  (01/30/2022): Findings consistent with widespread bony metastatic disease. - IMDC intermediate risk group - Ipilimumab and nivolumab cycle 1 started on 02/08/2022. - SRS of the brain lesions on 02/12/2022    Social/family history: - He is seen today with his girlfriend.  He worked as Clinical biochemist for 46 years.  He is a current active smoker, currently smoking half pack per day.  Previously smoked up to 2 packs/day for 50 years. - Mother had throat cancer.  2 paternal uncles and 1 paternal aunt had cancers.   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 - He is remaining mostly in the couch or in the chair. - Recommend home physical therapy. - Reviewed MRI of the lumbar spine from 03/14/2022 which showed epidural disease at T12 and L4 with no cord compression.  I have recommended XRT at these areas.  We will make referral back to radiation oncology. - Reviewed labs today which showed normal LFTs with albumin 3.0.  CBC was grossly normal.  TSH was 2.2. - Proceed with cycle 3 today.  RTC 3 weeks for follow-up.  Plan to repeat scans for treatment response after cycle 4.  2.  Left shoulder pain: - He is status post left shoulder radiation therapy which improved pain. - Continue oxycodone 10 mg every 4-6 hours as needed during daytime.  3.  Weight loss: - He lost 10 pounds in the last 3 weeks.  He reports he has been eating better in the last 1 week. - He did not start taking Megace as he did not want to take any new medications.  He is also not drinking Premier protein on regular basis. - We will recommend dietary evaluation.  4.  Bone metastasis: - Continue Zometa every 4 weeks.  Calcium is 8.8.   Orders placed this encounter:  No orders of the defined types were placed in this encounter.    Derek Jack, MD [place] [phone]   I, Thana Ates, am  acting as a scribe for Dr. Derek Jack.  I, Derek Jack MD, have reviewed the above documentation for accuracy and completeness, and I agree with the above.

## 2022-03-22 NOTE — Progress Notes (Signed)
Location/Histology of Brain Tumor: Right Cerebellar Hemisphere   Dr. Delton Coombes 01/14/2022 MR Brain FINDINGS: Brain:   There is a 1.1 cm AP x 1.1 cm TV peripherally enhancing lesion in the anterior aspect of the right cerebellar hemisphere abutting the middle cerebellar peduncle with mild surrounding edema.   There is a 1.5 cm by 0.9 cm enhancing lesion more posterolaterally in the right cerebellar hemisphere with mild surrounding edema.  There is no significant mass effect or effacement of fourth ventricle.   No other parenchymal metastases are seen.   There is no acute intracranial hemorrhage, extra-axial fluid collection, or acute infarct.   Background parenchymal volume is normal. The ventricles are normal in size. Gray-white differentiation is preserved. Punctate chronic microhemorrhages in the right corona radiata and left cerebellar hemisphere are noted, nonspecific.   There is no midline shift   Vascular: Normal flow voids.   Skull and upper cervical spine: The enhancing lesion centered in the right clivus extending inferolaterally to the right occipital condyle is again seen. The lesion again encases the internal carotid artery (14-44).   Sinuses/Orbits: The paranasal sinuses are clear. The globes and orbits are unremarkable.   Other: The enhancing lesion in the left parietal scalp has slightly increased in size. There is no evidence of invasion of the underlying calvarium.   IMPRESSION: 1. 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 2. No significant interval change in size or appearance of the right skull base metastatic lesion. 3. Increased size of the left parietal scalp lesion, again favored benign. Recommend correlation with physical exam.    Dr. Tammi Klippel 02/05/2022 MR Brain with/without Contrast CLINICAL DATA:  Metastatic renal cell carcinoma. SRS treatment planning.  FINDINGS: Brain:  There is no evidence of an acute infarct, midline shift, or extra-axial fluid collection. The ventricles and sulci are within normal limits for age. Chronic  microhemorrhages in the right corona radiata and medial left cerebellar hemisphere are unchanged. T2 hyperintensities in the cerebral white matter bilaterally are unchanged and nonspecific but compatible with mild chronic small vessel ischemic disease.   Two enhancing lesions in the right cerebellar hemisphere have mildly enlarged, now measuring 1.4 cm (series 11, image 51) and 2.0 cm (series 11, image 55) with slight worsening of mild edema but no significant mass effect. No new additional enhancing brain lesions are identified.   Vascular: Major intracranial vascular flow voids are preserved.   Skull and upper cervical spine: Unchanged enhancing skull base lesion in the right occipital condyle and clivus with involvement of the hypoglossal canal and carotid canal.   Sinuses/Orbits: Unremarkable orbits. Clear paranasal sinuses. Trace bilateral mastoid fluid.   Other: Unchanged 1.6 cm enhancing left parietal scalp mass, indeterminate.   IMPRESSION: 1. Mild enlargement of two right cerebellar metastases with slight worsening of mild edema. No additional brain metastases identified. 2. Unchanged right skull base metastasis.   03/14/2022 Dr. Delton Coombes MR Lumbar Spine CLINICAL DATA:  Metastatic renal carcinoma   FINDINGS: Segmentation:  Standard.   Alignment:  No significant listhesis.   Vertebrae: Extensive metastatic disease involving included osseous structures. Extraosseous extension is most apparent at T12 and L4.  Vertebral body heights are similar to May CT abdomen.   Conus medullaris and cauda equina: Conus extends to the L1 level.  Conus and cauda equina appear normal. No abnormal intrathecal enhancement.   Paraspinal and other soft tissues: Right nephrectomy.   Disc levels:   T12-L1: Right ventral and  lateral epidural  disease at the T12 level effaces the right ventral subarachnoid space and mildly encroaches on the right T12-L1 neural foramen. No canal stenosis at disc level.  No left foraminal narrowing.   L1-L2: No canal or foraminal stenosis.   L2-L3: Disc bulge. Minor canal stenosis. No significant foraminal stenosis.   L3-L4: Disc bulge. Facet arthropathy. Mild canal stenosis. No right foraminal stenosis. Mild left foraminal stenosis. Below disc level, there is left ventral epidural disease encroaching on the spinal canal including traversing left L4 nerve root.   L4-L5: Disc bulge with small central protrusion. Facet arthropathy.  Mild canal stenosis. Partial effacement of subarticular recesses.  Mild foraminal stenosis.   L5-S1: Disc bulge with superimposed central extrusion extending just below disc level. No canal stenosis. Slight effacement of subarticular recesses. Moderate right and mild left foraminal stenosis.   IMPRESSION: Extensive osseous metastatic disease. Vertebral body heights are similar to May CT abdomen. Epidural disease is present and most apparent at T12 and L4 without high-grade canal narrowing.   Past or anticipated interventions, if any, per neurosurgery:   Past or anticipated interventions, if any, per medical oncology:   Dose of Decadron, if applicable: '4mg'$   Recent neurologic symptoms, if any:  Seizures: No Headaches: No Nausea: No Dizziness/ataxia: No Difficulty with hand coordination: No Focal numbness/weakness: Yes, left hand Visual deficits/changes: No Confusion/Memory deficits: No  Painful bone metastases at present, if any: Left leg and left thumb/ shoulder  SAFETY ISSUES: Prior radiation? Yes Pacemaker/ICD? No Possible current pregnancy? No Is the patient on methotrexate? No  Additional Complaints / other details: Port-A-Cath on right

## 2022-03-22 NOTE — Progress Notes (Signed)
Nutrition Assessment   Reason for Assessment: Provider referral (poor appetite, wt loss)   ASSESSMENT: 66 year old male with renal cell carcinoma metastatic to bone, liver, lung. S/p radical nephrectomy in 2017. Patient completed SRS to brain lesions 02/12/22. He is currently receiving nivolumab + Ipilimumab q21d and zometa q28d.   Met with patient in infusion. Daughter arrived with chick fila sandwich during visit. Patient reports appetite has been better the last week. Recently patient has been eating potatoes with scrambled eggs. This taste good to him. Yesterday he had couple of eggs for breakfast, snacked on honey bun and reese's peanut butter cups through out the day. He ate one piece of chicken, slaw, mashed potato/gravy for supper. Patient reports he has been drinking one premier protein. He is agreeable to trying chocolate Ensure Complete today. Patient denies nausea, vomiting, diarrhea, constipation.     Nutrition Focused Physical Exam:   Orbital Region: severe Buccal Region: severe Upper Arm Region: severe Thoracic and Lumbar Region: severe Temple Region: moderate Clavicle Bone Region: severe Shoulder and Acromion Bone Region: severe Scapular Bone Region: UTA Dorsal Hand: severe Patellar Region: UTA Anterior Thigh Region: UTA Posterior Calf Region: severe Edema (RD assessment): none Hair: reviewed Eyes: reviewed Mouth: reviewed Skin: dry Nails: reviewed   Medications: Vit C, Biotin, Vit D, Decadron, Gabapentin, Ativan, Oxycodone, Senokot   Labs: Na 134, glucose 121   Anthropometrics:   Height: 5' 4.75" Weight: 115 lb 9.6 oz  UBW: 139 lb 11.2 oz (01/03/22) BMI: 19.39   NUTRITION DIAGNOSIS: Severe malnutrition related to chronic illness (metastatic renal cell carcinoma) as evidenced by moderate/severe fat and muscle depletion   INTERVENTION:  Encouraged small frequent meals and snacks with adequate calories/protein q2h - snack ideas provided  Suggested soft  moist high protein foods for ease of intake - handout with ideas + high calorie/high protein shake recipes provided  Continue drinking protein shakes, suggested switching to Ensure Plus/equivalent for added calories - recommend 2-3/day  One complimentary case of Ensure Plus High Protein provided Contact information given    MONITORING, EVALUATION, GOAL: Patient will tolerate increased calories and protein to minimize further weight loss    Next Visit: Thursday August 31 during infusion

## 2022-03-22 NOTE — Patient Instructions (Signed)
Gregory [name]  Discharge Instructions: Thank you for choosing [facility] to provide your oncology and hematology care.  If you have a lab appointment with the [facility], please come in thru the Main Entrance and check in at the main information desk.  Wear comfortable clothing and clothing appropriate for easy access to any Portacath or PICC line.   We strive to give you quality time with your provider. You may need to reschedule your appointment if you arrive late (15 or more minutes).  Arriving late affects you and other patients whose appointments are after yours.  Also, if you miss three or more appointments without notifying the office, you may be dismissed from the clinic at the provider's discretion.      For prescription refill requests, have your pharmacy contact our office and allow 72 hours for refills to be completed.    Today you received the following chemotherapy and/or immunotherapy agents Opdivo,Yervoy.  Nivolumab injection What is this medication? NIVOLUMAB (nye VOL ue mab) is a monoclonal antibody. It treats certain types of cancer. Some of the cancers treated are colon cancer, head and neck cancer, Hodgkin lymphoma, lung cancer, and melanoma. This medicine may be used for other purposes; ask your health care provider or pharmacist if you have questions. COMMON BRAND NAME(S): Opdivo What should I tell my care team before I take this medication? They need to know if you have any of these conditions: Autoimmune diseases such as Crohn's disease, ulcerative colitis, or lupus Have had or planning to have an allogeneic stem cell transplant (uses someone else's stem cells) History of chest radiation Organ transplant Nervous system problems such as myasthenia gravis or Guillain-Barre syndrome An unusual or allergic reaction to nivolumab, other medicines, foods, dyes, or preservatives Pregnant or trying to get pregnant Breast-feeding How should I use  this medication? This medication is injected into a vein. It is given in a hospital or clinic setting. A special MedGuide will be given to you before each treatment. Be sure to read this information carefully each time. Talk to your care team regarding the use of this medication in children. While it may be prescribed for children as young as 12 years for selected conditions, precautions do apply. Overdosage: If you think you have taken too much of this medicine contact a poison control center or emergency room at once. NOTE: This medicine is only for you. Do not share this medicine with others. What if I miss a dose? Keep appointments for follow-up doses. It is important not to miss your dose. Call your care team if you are unable to keep an appointment. What may interact with this medication? Interactions have not been studied. This list may not describe all possible interactions. Give your health care provider a list of all the medicines, herbs, non-prescription drugs, or dietary supplements you use. Also tell them if you smoke, drink alcohol, or use illegal drugs. Some items may interact with your medicine. What should I watch for while using this medication? Your condition will be monitored carefully while you are receiving this medication. You may need blood work done while you are taking this medication. Do not become pregnant while taking this medication or for 5 months after stopping it. Women should inform their care team if they wish to become pregnant or think they might be pregnant. There is a potential for serious harm to an unborn child. Talk to your care team for more information. Do not breast-feed an  infant while taking this medication or for 5 months after stopping it. What side effects may I notice from receiving this medication? Side effects that you should report to your care team as soon as possible: Allergic reactions--skin rash, itching, hives, swelling of the face, lips,  tongue, or throat Bloody or black, tar-like stools Change in vision Chest pain Diarrhea Dry cough, shortness of breath or trouble breathing Eye pain Fast or irregular heartbeat Fever, chills High blood sugar (hyperglycemia)--increased thirst or amount of urine, unusual weakness or fatigue, blurry vision High thyroid levels (hyperthyroidism)--fast or irregular heartbeat, weight loss, excessive sweating or sensitivity to heat, tremors or shaking, anxiety, nervousness, irregular menstrual cycle or spotting Kidney injury--decrease in the amount of urine, swelling of the ankles, hands, or feet Liver injury--right upper belly pain, loss of appetite, nausea, light-colored stool, dark yellow or brown urine, yellowing skin or eyes, unusual weakness or fatigue Low red blood cell count--unusual weakness or fatigue, dizziness, headache, trouble breathing Low thyroid levels (hypothyroidism)--unusual weakness or fatigue, increased sensitivity to cold, constipation, hair loss, dry skin, weight gain, feelings of depression Mood and behavior changes-confusion, change in sex drive or performance, irritability Muscle pain or cramps Pain, tingling, or numbness in the hands or feet, muscle weakness, trouble walking, loss of balance or coordination Red or dark brown urine Redness, blistering, peeling, or loosening of the skin, including inside the mouth Stomach pain Unusual bruising or bleeding Side effects that usually do not require medical attention (report to your care team if they continue or are bothersome): Bone pain Constipation Loss of appetite Nausea Tiredness Vomiting This list may not describe all possible side effects. Call your doctor for medical advice about side effects. You may report side effects to FDA at 1-800-FDA-1088. Where should I keep my medication? This medication is given in a hospital or clinic and will not be stored at home. NOTE: This sheet is a summary. It may not cover all  possible information. If you have questions about this medicine, talk to your doctor, pharmacist, or health care provider.  2023 Elsevier/Gold Standard (2021-07-21 00:00:00)Ipilimumab injection What is this medication? IPILIMUMAB (IP i LIM ue mab) is a monoclonal antibody. It treats colorectal cancer, esophageal cancer, kidney cancer, liver cancer, lung cancer, melanoma, and mesothelioma. This medicine may be used for other purposes; ask your health care provider or pharmacist if you have questions. COMMON BRAND NAME(S): YERVOY What should I tell my care team before I take this medication? They need to know if you have any of these conditions: autoimmune diseases like Crohn's disease, ulcerative colitis, or lupus have had or planning to have an allogeneic stem cell transplant (uses someone else's stem cells) history of organ transplant nervous system problems like myasthenia gravis or Guillain-Barre syndrome an unusual or allergic reaction to ipilimumab, other medicines, foods, dyes, or preservatives pregnant or trying to get pregnant breast-feeding How should I use this medication? This medicine is for infusion into a vein. It is given by a health care professional in a hospital or clinic setting. A special MedGuide will be given to you before each treatment. Be sure to read this information carefully each time. Talk to your pediatrician regarding the use of this medicine in children. While this drug may be prescribed for children as young as 12 years for selected conditions, precautions do apply. Overdosage: If you think you have taken too much of this medicine contact a poison control center or emergency room at once. NOTE: This medicine is only for you.  Do not share this medicine with others. What if I miss a dose? It is important not to miss your dose. Call your doctor or health care professional if you are unable to keep an appointment. What may interact with this  medication? Interactions are not expected. This list may not describe all possible interactions. Give your health care provider a list of all the medicines, herbs, non-prescription drugs, or dietary supplements you use. Also tell them if you smoke, drink alcohol, or use illegal drugs. Some items may interact with your medicine. What should I watch for while using this medication? Tell your doctor or healthcare professional if your symptoms do not start to get better or if they get worse. Do not become pregnant while taking this medicine or for 3 months after stopping it. Women should inform their doctor if they wish to become pregnant or think they might be pregnant. There is a potential for serious side effects to an unborn child. Talk to your health care professional or pharmacist for more information. Do not breast-feed an infant while taking this medicine or for 3 months after the last dose. Your condition will be monitored carefully while you are receiving this medicine. You may need blood work done while you are taking this medicine. What side effects may I notice from receiving this medication? Side effects that you should report to your doctor or health care professional as soon as possible: allergic reactions like skin rash, itching or hives, swelling of the face, lips, or tongue black, tarry stools bloody or watery diarrhea changes in vision dizziness eye pain fast, irregular heartbeat feeling anxious feeling faint or lightheaded, falls nausea, vomiting pain, tingling, numbness in the hands or feet redness, blistering, peeling or loosening of the skin, including inside the mouth signs and symptoms of liver injury like dark yellow or brown urine; general ill feeling or flu-like symptoms; light-colored stools; loss of appetite; nausea; right upper belly pain; unusually weak or tired; yellowing of the eyes or skin unusual bleeding or bruising Side effects that usually do not require  medical attention (report to your doctor or health care professional if they continue or are bothersome): headache loss of appetite trouble sleeping This list may not describe all possible side effects. Call your doctor for medical advice about side effects. You may report side effects to FDA at 1-800-FDA-1088. Where should I keep my medication? This drug is given in a hospital or clinic and will not be stored at home. NOTE: This sheet is a summary. It may not cover all possible information. If you have questions about this medicine, talk to your doctor, pharmacist, or health care provider.  2023 Elsevier/Gold Standard (2021-07-21 00:00:00)        To help prevent nausea and vomiting after your treatment, we encourage you to take your nausea medication as directed.  BELOW ARE SYMPTOMS THAT SHOULD BE REPORTED IMMEDIATELY: *FEVER GREATER THAN 100.4 F (38 C) OR HIGHER *CHILLS OR SWEATING *NAUSEA AND VOMITING THAT IS NOT CONTROLLED WITH YOUR NAUSEA MEDICATION *UNUSUAL SHORTNESS OF BREATH *UNUSUAL BRUISING OR BLEEDING *URINARY PROBLEMS (pain or burning when urinating, or frequent urination) *BOWEL PROBLEMS (unusual diarrhea, constipation, pain near the anus) TENDERNESS IN MOUTH AND THROAT WITH OR WITHOUT PRESENCE OF ULCERS (sore throat, sores in mouth, or a toothache) UNUSUAL RASH, SWELLING OR PAIN  UNUSUAL VAGINAL DISCHARGE OR ITCHING   Items with * indicate a potential emergency and should be followed up as soon as possible or go to the Emergency Department  if any problems should occur.  Please show the CHEMOTHERAPY ALERT CARD or IMMUNOTHERAPY ALERT CARD at check-in to the Emergency Department and triage nurse.  Should you have questions after your visit or need to cancel or reschedule your appointment, please contact [facility] [phone]  and follow the prompts.  Office hours are 8:00 a.m. to 4:30 p.m. Monday - Friday. Please note that voicemails left after 4:00 p.m. may  not be returned until the following business day.  We are closed weekends and major holidays. You have access to a nurse at all times for urgent questions. Please call the main number to the clinic [phone] and follow the prompts.  For any non-urgent questions, you may also contact your provider using MyChart. We now offer e-Visits for anyone 58 and older to request care online for non-urgent symptoms. For details visit [email or web address].   Also download the MyChart app! Go to the app store, search "MyChart", open the app, select [facility], and log in with your MyChart username and password.  Masks are optional in the cancer centers. If you would like for your care team to wear a mask while they are taking care of you, please let them know. For doctor visits, patients may have with them one support person who is at least 67 years old. At this time, visitors are not allowed in the infusion area.

## 2022-03-22 NOTE — Progress Notes (Signed)
Patients port flushed without difficulty.  Good blood return noted with no bruising or swelling noted at site.  Stable during access and blood draw.  Patient to remain accessed for treatment. 

## 2022-03-26 ENCOUNTER — Other Ambulatory Visit: Payer: Self-pay

## 2022-03-27 ENCOUNTER — Other Ambulatory Visit: Payer: Self-pay

## 2022-03-27 ENCOUNTER — Telehealth (HOSPITAL_COMMUNITY): Payer: Self-pay | Admitting: *Deleted

## 2022-03-27 ENCOUNTER — Encounter (HOSPITAL_COMMUNITY): Payer: Self-pay | Admitting: *Deleted

## 2022-03-27 NOTE — Telephone Encounter (Signed)
Message left for patient regarding home health preference and to obtain his physical address.

## 2022-03-27 NOTE — Progress Notes (Signed)
  Radiation Oncology         [phone]) [phone] ________________________________  Name: Gregory [name]. 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     Indication for treatment:  Palliation       Radiation treatment dates:   02/20/22 - 03/08/22  Site/dose:   The painful metastasis in the left shoulder/scapula was treated to 30 Gy in 10 fractions of 3 Gy each.  Beams/energy:   A 3D field set-up was employed with 6 MV X-rays  Narrative: The patient tolerated radiation treatment relatively well.   Plan: The patient has completed radiation treatment. The patient will return to radiation oncology clinic for routine followup in one month. I advised him to call or return sooner if he has any questions or concerns related to his recovery or treatment. ________________________________  Sheral Apley. Tammi Klippel, M.D.

## 2022-03-27 NOTE — Progress Notes (Signed)
Referral sent to [facility] to evaluate and treat for nursing and PT.  Faxed to [phone] Phone [phone].  Patient aware.

## 2022-03-27 NOTE — Progress Notes (Signed)
  Radiation Oncology         [phone]) [phone] ________________________________  Name: Gregory [name]. 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     Indication for treatment:  Palliation       Radiation treatment dates:   02/16/22  Site/dose/beams/energy: Single fraction SRS brain treatment to the following targets:   Narrative: The patient tolerated radiation treatment relatively well without any ill side effects.  Plan: The patient has completed radiation treatment. The patient will return to radiation oncology clinic for routine followup in one month. I advised them to call or return sooner if they have any questions or concerns related to their recovery or treatment. ________________________________  Sheral Apley. Tammi Klippel, M.D.

## 2022-03-27 NOTE — Progress Notes (Signed)
Radiation Oncology         [phone] ________________________________  Outpatient Re-Consultation - Conducted via telephone due to current COVID-19 concerns for limiting patient exposure  Name: Gregory [name]. MRN: [id number]  Date of Service: 03/22/2022 DOB: November 08, 1955  BJ:SEGBTDV, No Pcp Per  Derek Jack, MD   REFERRING PHYSICIAN: Derek Jack, MD  DIAGNOSIS: 66 y/o male with a progressive bony metastasis in the lumbar spine secondary to his known metastatic renal cell carcinoma.    ICD-10-CM   1. 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 is a 66 y.o. male seen at the request of Dr. Delton Coombes. He is well known to our service, having previously been treated with fractionated SRS to a metastatic lesion in the right skull base in 2017 and more recently, had SRS to two brain metastases on 02/16/22 and palliative radiation to the left shoulder completed 03/08/22.  In summary, he presented to ophthalmology for a right eye injury, and a brain MRI for work-up on 10/21/15 revealed a 5 cm right central and posterior skull-based mass. Further work up with CT C/A/P from the same day showed an enhancing right renal mass. Biopsy of the renal mass on 11/01/15 revealed clear-cell renal cell carcinoma and was treated with a right, robotic-assisted laparoscopic radical nephrectomy on 01/10/17, under the care of Dr. Brendia Sacks at [facility]. He was previously seen by Dr. Whitney Muse and Dr. Alen Blew and was under observation, but he did not follow up after 2018. He has been non-compliant with follow up MRI brain scans due to the fact that he is very claustrophobic and tolerates the scans very poorly. He developed some trouble with his vision/squinting and did undergo brain MRI on 10/27/20 for further evaluation which showed a stable appearance of the previously treated skull base lesion, and no  new skull lesions or brain metastasis.  There was a new scalp lesion at the left frontoparietal region felt most likely to be a Pilar cyst so the recommendation was for a repeat scan in 3 to 4 months but he did not follow-up and refused further scans.  More recently, he developed progressive left shoulder pain so he presented to his orthopedist, Dr. Chauncey Reading, for evaluation on 12/05/2021.  Plain film x-rays of the left shoulder showed a suspicious mass in the supraclavicular region which was further evaluated with a shoulder MRI on 12/23/21.  The MRI showed a large, (11 x 9 x 8.5 cm) heterogeneous mass lesion with soft tissue extension emanating from the left scapula, as well as multiple rib and left humeral lesions, worrisome for metastatic disease.  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 Nydia Bouton, an oncologic orthopedic surgeon at [facility] with consultation scheduled for 01/11/2022 to discuss biopsy and/or surgical options. In the interim, he presented to our clinic on 12/29/21 for a second opinion. Given the extent of disease in the shoulder, it was not felt that surgery was indicated and instead, we felt that he would benefit from further evaluation with complete disease staging with medical oncology and consideration of a 10 fraction course of stereotactic body radiotherapy (SBRT) to the metastatic lesion in the left scapula for palliation of pain. He re-established care with medical oncology, under the care of Dr. Delton Coombes, on 01/03/22, with recommendation for PET and MRI  brain imaging to complete his disease restaging and biopsy of the left shoulder with IR for tissue confirmation prior to treatment.   MRI brain was performed 01/14/22 and showed two new metastatic lesions in the right cerebellar hemisphere with mild surrounding edema but no regional mass effect. The previously treated right skull base  metastatic lesion appeared stable and there was some increased size of a left parietal scalp lesion, but again favored benign. CT-guided biopsy of the destructive left scapular mass was performed on 01/17/22 and confirmed metastatic renal cell carcinoma. Unfortunately, the PET scan was denied by insurance so he had CT A/P and Bone scan 01/30/22 which did show extensive bony metastatic disease in the spine, pelvis, ribs, sternum and bilateral scapulae. There was also pulmonary metastatic, nodal and hepatic metastatic disease, intramuscular metastatic disease and contralateral LEFT renal lesions, either metastatic or new primary renal cell carcinoma with signs of prior RIGHT nephrectomy. He was started on combination immunotherapy with  ipilimumab and nivolumab every 3 weeks for 4 cycles, started 02/08/22 with plans to transition to single agent, nivolumab maintenance at cycle 5. He had SRS on 02/16/22 to treat the 2 brain lesions and then completed a 2 week course of daily, palliative radiation to the left shoulder/scapula on 03/08/22. His pain improved significantly following treatment and he has continued on immunotherapy, tolerating this well. He has had some mild low back pain that radiates into the left buttock and LLL so he had an MRI lumbar spine to further assess the findings in the spine noted on the previous CT A/P. The lumbar MRI from 03/14/22 did confirm extensive osseous metastatic disease with progressive disease with epidural involvement at T12/L1 and L4/5. We discussed these findings today and he reports that the back pain is not progressive or severe and does respond well to his current pain medications.   PREVIOUS RADIATION THERAPY: Yes   02/20/22 - 03/08/22:  The painful metastasis in the left shoulder/scapula was treated to 30 Gy in 10 fractions of 3 Gy each.  02/16/22:  Single fraction SRS brain treatment to the following targets:   11/14/15 - 11/23/15: the right skull base target treated to 25 Gy  in 5 fractions of 5 Gy each.  PAST MEDICAL HISTORY:  Past Medical History:  Diagnosis Date   Port-A-Cath in place 02/04/2022   Renal cell carcinoma ([place]) 10/23/2015   Skull lesion 10/20/2015   metastatic renal cell carcinoma      PAST SURGICAL HISTORY: Past Surgical History:  Procedure Laterality Date   PORTACATH PLACEMENT Right 02/23/2022   Procedure: INSERTION PORT-A-CATH;  Surgeon: Aviva Signs, MD;  Location: [facility];  Service: General;  Laterality: Right;   robotic radical right nephrectomy      FAMILY HISTORY:  Family History  Problem Relation Age of Onset   CAD Mother    CAD Father    Cancer Paternal Uncle        stomach cancer   Cancer Paternal Uncle        unknown    SOCIAL HISTORY:  Social History   Socioeconomic History   Marital status: Single    Spouse name: Not on file   Number of children: Not on file   Years of education: Not on file   Highest education level: Not on file  Occupational History   Occupation: Programmer, systems: OTHER    Comment: [employer]  Tobacco Use   Smoking status: Every Day    Packs/day: 0.50  Years: 45.00    Total pack years: 22.50    Types: Cigarettes   Smokeless tobacco: Never  Vaping Use   Vaping Use: Never used  Substance and Sexual Activity   Alcohol use: Not Currently    Alcohol/week: 20.0 standard drinks of alcohol    Types: 20 Shots of liquor per week    Comment: 1-5 shot of liquor per night   Drug use: Yes    Frequency: 1.0 times per week    Comment: occasionally   Sexual activity: Yes  Other Topics Concern   Not on file  Social History Narrative   Not on file   Social Determinants of Health   Financial Resource Strain: Not on file  Food Insecurity: Not on file  Transportation Needs: Not on file  Physical Activity: Not on file  Stress: Not on file  Social Connections: Not on file  Intimate Partner Violence: Not on file    ALLERGIES: Patient has no known allergies.  MEDICATIONS:   Current Outpatient Medications  Medication Sig Dispense Refill   Ascorbic Acid (VITAMIN C PO) Take 2 tablets by mouth daily.     Biotin 2500 MCG CAPS Take 5,000 mcg by mouth daily.     Cholecalciferol (VITAMIN D) 50 MCG (2000 UT) tablet Take 4,000 Units by mouth daily.     Cyanocobalamin (B-12) 3000 MCG CAPS Take 6,000 mcg by mouth daily.     dexamethasone (DECADRON) 4 MG tablet Take 1 tablet (4 mg total) by mouth daily. 30 tablet 3   ELDERBERRY PO Take 2 capsules by mouth daily.     gabapentin (NEURONTIN) 100 MG capsule Take 1 capsule (100 mg total) by mouth 3 (three) times daily. 90 capsule 3   Ipilimumab (YERVOY IV) Inject into the vein every 21 ( twenty-one) days. X 4 cycles     lidocaine-prilocaine (EMLA) cream Apply a small amount to port a cath site and cover with plastic wrap 1 hour prior to infusion appointments (Patient not taking: Reported on 03/01/2022) 30 g 3   LORazepam (ATIVAN) 1 MG tablet Take 1 tablet (1 mg total) by mouth at bedtime as needed for anxiety. 30 tablet 0   Nivolumab (OPDIVO IV) Inject into the vein every 21 ( twenty-one) days.     Oxycodone HCl 10 MG TABS Take 1 tablet (10 mg total) by mouth every 4 (four) hours as needed. 168 tablet 0   senna-docusate (SENOKOT-S) 8.6-50 MG tablet Take 1 tablet by mouth 2 (two) times daily.     No current facility-administered medications for this encounter.    REVIEW OF SYSTEMS:  On review of systems, the patient reports that he is doing well overall. He denies any chest pain, shortness of breath, cough, fevers, chills, or night sweats. He has continued with unintentional weight loss, down approximately 10 lbs over the last month. He denies any bowel or bladder disturbances, and denies abdominal pain, nausea or vomiting.  He has not had any headaches, blurry vision, changes in auditory or visual acuity, dizziness/imbalance or generalized weakness.  He reports that the left arm/shoulder pain is significantly improved and feels that  his pain is well managed with his current pain medication regimen.  His back pain is described as mild and does occasionally radiate in to the left buttock and LLE. He denies any focal weakness or paraesthesias in the LEs.  A complete review of systems is obtained and is otherwise negative.    PHYSICAL EXAM:  Wt Readings from Last 3 Encounters:  03/22/22 115 lb 9.6 oz (52.4 kg)  03/05/22 125 lb (56.7 kg)  03/01/22 126 lb (57.2 kg)   Temp Readings from Last 3 Encounters:  03/22/22 98.5 F (36.9 C) (Tympanic)  03/22/22 99.6 F (37.6 C) (Tympanic)  03/05/22 98 F (36.7 C) (Oral)   BP Readings from Last 3 Encounters:  03/22/22 116/75  03/22/22 110/78  03/05/22 114/76   Pulse Readings from Last 3 Encounters:  03/22/22 81  03/22/22 (!) 102  03/05/22 74   Pain Assessment Pain Score: 0-No pain/10 Unable to assess due to telephone re-consult visit format.   KPS = 90  100 - Normal; no complaints; no evidence of disease. 90   - Able to carry on normal activity; minor signs or symptoms of disease. 80   - Normal activity with effort; some signs or symptoms of disease. 5   - Cares for self; unable to carry on normal activity or to do active work. 60   - Requires occasional assistance, but is able to care for most of his personal needs. 50   - Requires considerable assistance and frequent medical care. 74   - Disabled; requires special care and assistance. 68   - Severely disabled; hospital admission is indicated although death not imminent. 63   - Very sick; hospital admission necessary; active supportive treatment necessary. 10   - Moribund; fatal processes progressing rapidly. 0     - Dead  Karnofsky DA, Abelmann [place], Craver LS and Burchenal JH [phone]) The use of the nitrogen mustards in the palliative treatment of carcinoma: with particular reference to bronchogenic carcinoma Cancer 1 [id number]  LABORATORY DATA:  Lab Results  Component Value Date   WBC 13.2 (H) 03/22/2022   HGB  12.6 (L) 03/22/2022   HCT 38.8 (L) 03/22/2022   MCV 81.3 03/22/2022   PLT 259 03/22/2022   Lab Results  Component Value Date   NA 134 (L) 03/22/2022   K 4.2 03/22/2022   CL 102 03/22/2022   CO2 25 03/22/2022   Lab Results  Component Value Date   ALT 15 03/22/2022   AST 24 03/22/2022   ALKPHOS 100 03/22/2022   BILITOT 0.5 03/22/2022     RADIOGRAPHY: MR Lumbar Spine W Wo Contrast  Result Date: 03/16/2022 CLINICAL DATA:  Metastatic renal carcinoma EXAM: MRI LUMBAR SPINE WITHOUT AND WITH CONTRAST TECHNIQUE: Multiplanar and multiecho pulse sequences of the lumbar spine were obtained without and with intravenous contrast. CONTRAST:  46m MULTIHANCE GADOBENATE DIMEGLUMINE 529 MG/ML IV SOLN COMPARISON:  None Available. FINDINGS: Segmentation:  Standard. Alignment:  No significant listhesis. Vertebrae: Extensive metastatic disease involving included osseous structures. Extraosseous extension is most apparent at T12 and L4. Vertebral body heights are similar to May CT abdomen. Conus medullaris and cauda equina: Conus extends to the L1 level. Conus and cauda equina appear normal. No abnormal intrathecal enhancement. Paraspinal and other soft tissues: Right nephrectomy. Disc levels: T12-L1: Right ventral and lateral epidural disease at the T12 level effaces the right ventral subarachnoid space and mildly encroaches on the right T12-L1 neural foramen. No canal stenosis at disc level. No left foraminal narrowing. L1-L2: No canal or foraminal stenosis. L2-L3: Disc bulge. Minor canal stenosis. No significant foraminal stenosis. L3-L4: Disc bulge. Facet arthropathy. Mild canal stenosis. No right foraminal stenosis. Mild left foraminal stenosis. Below disc level, there is left ventral epidural disease encroaching on the spinal canal including traversing left L4 nerve root. L4-L5: Disc bulge with small central protrusion. Facet arthropathy. Mild canal stenosis. Partial effacement  of subarticular recesses. Mild  foraminal stenosis. L5-S1: Disc bulge with superimposed central extrusion extending just below disc level. No canal stenosis. Slight effacement of subarticular recesses. Moderate right and mild left foraminal stenosis. IMPRESSION: Extensive osseous metastatic disease. Vertebral body heights are similar to May CT abdomen. Epidural disease is present and most apparent at T12 and L4 without high-grade canal narrowing. Electronically Signed   By: Macy Mis M.D.   On: 03/16/2022 12:26   DG Elbow Complete Right  Result Date: 03/05/2022 CLINICAL DATA:  Fall with elbow pain EXAM: RIGHT ELBOW - COMPLETE 3+ VIEW COMPARISON:  None Available. FINDINGS: No fracture or malalignment. No elbow effusion. Small amount of gas at the posterior elbow. IMPRESSION: 1. No acute osseous abnormality 2. Small amount of soft tissue gas at the posterior elbow could be related to laceration, correlate with physical exam. Electronically Signed   By: Donavan Foil M.D.   On: 03/05/2022 16:21      IMPRESSION/PLAN: This visit was conducted via telephone to spare the patient unnecessary potential exposure in the healthcare setting during the current COVID-19 pandemic. 1. 66 y.o.  male with a progressive bony metastasis with epidural involvement in the lumbar spine secondary to his known metastatic renal cell carcinoma.  Today, we talked to the patient and his significant other, [name], about the findings and workup thus far. We discussed the natural history of metastatic renal cell carcinoma and general treatment, highlighting the role of radiotherapy in the management. We discussed the available radiation techniques, and focused on the details and logistics of delivery which he is now quite familiar with.  Given the progressive disease and epidural involvement at T12/L1 and L4/5, the recommendation is for a 5 fraction course of stereotactic body radiotherapy (SBRT) to the disease at these levels. We reviewed the anticipated acute and  late sequelae associated with radiation in this setting.  The patient was encouraged to ask questions that were answered to his stated satisfaction.  He has given verbal consent to proceed and is tentatively scheduled for CT SIM on 03/28/22, in anticipation of beginning his treatments in the near future. He appears to have a good understanding of his disease and our treatment recommendations and is comfortable and in agreement with the stated plan.  He will sign formal written consent to proceed at the time of his CT simulation and a copy of this document will be placed in his medical record.  We enjoyed meeting with him and [name] again today and look forward to continuing to participate in his care.  We will share this discussion with Dr. Delton Coombes and proceed with treatment planning accordingly.   Given current concerns for patient exposure during the COVID-19 pandemic, this encounter was conducted via telephone. The patient was notified in advance and was offered a [place] meeting to allow for face to face communication but unfortunately reported that he did not have the appropriate resources/technology to support such a visit and instead preferred to proceed with telephone consult. The patient has given verbal consent for this type of encounter. The attendants for this meeting include Tyler Pita MD, Freeman Caldron PA-C, patient Gregory [name]. and his significant other, [name]. During the encounter, Tyler Pita MD and Freeman Caldron PA-C were located at [facility].  Patient Gregory [name]. and his significant other, [name], were located at home.   We personally spent 40 minutes in this encounter including chart review, reviewing radiological studies, meeting face-to-face with  the patient, entering orders and completing documentation.    Nicholos Johns, PA-C    Tyler Pita, MD  [facility]  Dial: [phone]  Fax: [phone] [email or web address]  Skype  [email or web address]

## 2022-03-28 ENCOUNTER — Ambulatory Visit
Admission: RE | Admit: 2022-03-28 | Discharge: 2022-03-28 | Disposition: A | Discharge status: Home / Self Care | Payer: Medicare Other | Source: Ambulatory Visit | Attending: Radiation Oncology | Admitting: Radiation Oncology

## 2022-03-28 ENCOUNTER — Other Ambulatory Visit: Payer: Self-pay

## 2022-03-28 DIAGNOSIS — C7951 Secondary malignant neoplasm of bone: Secondary | ICD-10-CM | POA: Diagnosis not present

## 2022-03-28 DIAGNOSIS — C649 Malignant neoplasm of unspecified kidney, except renal pelvis: Secondary | ICD-10-CM

## 2022-03-29 ENCOUNTER — Encounter (HOSPITAL_COMMUNITY): Payer: Self-pay | Admitting: *Deleted

## 2022-03-29 NOTE — Progress Notes (Signed)
Patient has been accepted by [facility] for Nursing and PT.  Scheduled to be seen today.

## 2022-03-31 ENCOUNTER — Ambulatory Visit
Admission: RE | Admit: 2022-03-31 | Discharge: 2022-03-31 | Disposition: A | Discharge status: Home / Self Care | Payer: Medicare Other | Source: Ambulatory Visit | Attending: Hematology | Admitting: Hematology

## 2022-03-31 ENCOUNTER — Other Ambulatory Visit: Payer: Medicare Other

## 2022-03-31 MED ORDER — GADOBENATE DIMEGLUMINE 529 MG/ML IV SOLN
12.0000 mL | Freq: Once | INTRAVENOUS | Status: AC | PRN
  Administered 2022-03-31: 12 mL via INTRAVENOUS

## 2022-04-01 ENCOUNTER — Other Ambulatory Visit: Payer: Medicare Other

## 2022-04-01 NOTE — Progress Notes (Signed)
  Radiation Oncology         [phone] ________________________________  Name: [name]. MRN: [id number]  Date: 03/28/2022  DOB: 1956/03/10  STEREOTACTIC BODY RADIOTHERAPY SIMULATION AND TREATMENT PLANNING NOTE    ICD-10-CM   1. 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)  C79.51    C64.9       DIAGNOSIS:  66 y/o [name] with a progressive bony metastasis in the lumbar spine secondary to his known metastatic renal cell carcinoma  NARRATIVE:  The patient was brought to the [place].  Identity was confirmed.  All relevant records and images related to the planned course of therapy were reviewed.  The patient freely provided informed written consent to proceed with treatment after reviewing the details related to the planned course of therapy. The consent form was witnessed and verified by the simulation staff.  Then, the patient was set-up in a stable reproducible  supine position for radiation therapy.  A BodyFix immobilization pillow was fabricated for reproducible positioning.  Surface markings were placed.  The CT images were loaded into the planning software.  The gross target volumes (GTV) and planning target volumes (PTV) were delinieated, and avoidance structures were contoured.  Treatment planning then occurred.  The radiation prescription was entered and confirmed.  A total of two complex treatment devices were fabricated in the form of the BodyFix immobilization pillow and a neck accuform cushion.  I have requested : 3D Simulation  I have requested a DVH of the following structures: targets and all normal structures near the target including kidneys, bowel, spinal cord and skin as noted on the radiation plan to maintain doses in adherence with established limits  SPECIAL TREATMENT PROCEDURE:  The planned course of therapy using radiation constitutes a special treatment procedure. Special care is required in the management of this patient for the  following reasons. High dose per fraction requiring special monitoring for increased toxicities of treatment including daily imaging..  The special nature of the planned course of radiotherapy will require increased physician supervision and oversight to ensure patient's safety with optimal treatment outcomes.    This requires extended time and effort.    PLAN:  The patient will receive 50 Gy in Gregory fractions to the lesions with extradural extension in T12 and L4.  ________________________________  Sheral Apley. Tammi Klippel, M.D.

## 2022-04-02 ENCOUNTER — Other Ambulatory Visit (HOSPITAL_COMMUNITY): Payer: Self-pay

## 2022-04-02 ENCOUNTER — Encounter (HOSPITAL_COMMUNITY): Payer: Self-pay

## 2022-04-02 DIAGNOSIS — C649 Malignant neoplasm of unspecified kidney, except renal pelvis: Secondary | ICD-10-CM

## 2022-04-02 NOTE — Progress Notes (Signed)
Order placed for MRI C-Spine STAT per Dr. Delton Coombes

## 2022-04-02 NOTE — Progress Notes (Signed)
Notification received from Dr. Delton Coombes that he received a call report on patient's MRI T-Spine concerning for C6 Cord Compression. MRI C-Spine ordered per Dr. Delton Coombes for possible C6 Cord Compression. Dr. Delton Coombes also recommends neurosurgery consult. I have called and made the patient aware. Orders placed. Schedule pending.

## 2022-04-03 ENCOUNTER — Other Ambulatory Visit: Payer: Self-pay

## 2022-04-03 DIAGNOSIS — C649 Malignant neoplasm of unspecified kidney, except renal pelvis: Secondary | ICD-10-CM | POA: Diagnosis present

## 2022-04-03 DIAGNOSIS — Z5112 Encounter for antineoplastic immunotherapy: Secondary | ICD-10-CM | POA: Diagnosis not present

## 2022-04-03 DIAGNOSIS — C7931 Secondary malignant neoplasm of brain: Secondary | ICD-10-CM | POA: Insufficient documentation

## 2022-04-03 DIAGNOSIS — Z79899 Other long term (current) drug therapy: Secondary | ICD-10-CM | POA: Insufficient documentation

## 2022-04-03 DIAGNOSIS — C7951 Secondary malignant neoplasm of bone: Secondary | ICD-10-CM | POA: Diagnosis present

## 2022-04-03 DIAGNOSIS — Z510 Encounter for antineoplastic radiation therapy: Secondary | ICD-10-CM | POA: Diagnosis not present

## 2022-04-05 ENCOUNTER — Ambulatory Visit: Payer: Self-pay | Admitting: Urology

## 2022-04-06 ENCOUNTER — Ambulatory Visit
Admission: RE | Admit: 2022-04-06 | Discharge: 2022-04-06 | Disposition: A | Discharge status: Home / Self Care | Payer: Medicare Other | Source: Ambulatory Visit | Attending: Hematology | Admitting: Hematology

## 2022-04-06 ENCOUNTER — Other Ambulatory Visit: Payer: Self-pay

## 2022-04-06 ENCOUNTER — Telehealth: Payer: Self-pay | Admitting: Radiation Therapy

## 2022-04-06 DIAGNOSIS — C649 Malignant neoplasm of unspecified kidney, except renal pelvis: Secondary | ICD-10-CM

## 2022-04-06 MED ORDER — GADOBENATE DIMEGLUMINE 529 MG/ML IV SOLN
10.0000 mL | Freq: Once | INTRAVENOUS | Status: AC | PRN
  Administered 2022-04-06: 10 mL via INTRAVENOUS

## 2022-04-06 NOTE — Telephone Encounter (Signed)
I spoke with [name] about the appointment Monday 8/7 @ 8:30 to come in for treatment planning and delivery to his cervical spine. [name] was thankful for the call and requested a place to lay down while he waited for his treatments after SIM.   Mont Dutton R.T.(R)(T) Radiation Special Procedures Navigator

## 2022-04-08 ENCOUNTER — Other Ambulatory Visit: Payer: Self-pay

## 2022-04-09 ENCOUNTER — Ambulatory Visit
Admission: RE | Admit: 2022-04-09 | Discharge: 2022-04-09 | Disposition: A | Discharge status: Home / Self Care | Payer: PPO | Source: Ambulatory Visit | Attending: Radiation Oncology | Admitting: Radiation Oncology

## 2022-04-09 ENCOUNTER — Other Ambulatory Visit: Payer: Self-pay

## 2022-04-09 VITALS — BP 101/64 | HR 93 | Temp 97.3°F | Resp 20 | Ht 67.0 in

## 2022-04-09 DIAGNOSIS — C7951 Secondary malignant neoplasm of bone: Secondary | ICD-10-CM | POA: Diagnosis present

## 2022-04-09 DIAGNOSIS — Z79899 Other long term (current) drug therapy: Secondary | ICD-10-CM | POA: Diagnosis not present

## 2022-04-09 DIAGNOSIS — C649 Malignant neoplasm of unspecified kidney, except renal pelvis: Secondary | ICD-10-CM | POA: Diagnosis present

## 2022-04-09 DIAGNOSIS — Z5112 Encounter for antineoplastic immunotherapy: Secondary | ICD-10-CM | POA: Diagnosis not present

## 2022-04-09 DIAGNOSIS — C7931 Secondary malignant neoplasm of brain: Secondary | ICD-10-CM | POA: Diagnosis present

## 2022-04-09 DIAGNOSIS — Z510 Encounter for antineoplastic radiation therapy: Secondary | ICD-10-CM | POA: Diagnosis not present

## 2022-04-09 LAB — RAD ONC ARIA SESSION SUMMARY
Course Elapsed Days: 0
Course Elapsed Days: 0
Plan Fractions Treated to Date: 1
Plan Fractions Treated to Date: 1
Plan Fractions Treated to Date: 1
Plan Prescribed Dose Per Fraction: 10 Gy
Plan Prescribed Dose Per Fraction: 10 Gy
Plan Prescribed Dose Per Fraction: 3 Gy
Plan Total Fractions Prescribed: 5
Plan Total Fractions Prescribed: 5
Plan Total Fractions Prescribed: 10
Plan Total Prescribed Dose: 50 Gy
Plan Total Prescribed Dose: 50 Gy
Plan Total Prescribed Dose: 30 Gy
Reference Point Dosage Given to Date: 10 Gy
Reference Point Dosage Given to Date: 10 Gy
Reference Point Dosage Given to Date: 3 Gy
Reference Point Session Dosage Given: 10 Gy
Reference Point Session Dosage Given: 10 Gy
Reference Point Session Dosage Given: 3 Gy
Session Number: 1
Session Number: 2

## 2022-04-09 MED ORDER — LORAZEPAM 1 MG PO TABS
1.0000 mg | ORAL_TABLET | ORAL | Status: AC
  Administered 2022-04-09: 1 mg via ORAL
  Filled 2022-04-09: qty 1

## 2022-04-09 NOTE — Progress Notes (Signed)
  Radiation Oncology         [phone]) [phone] ________________________________  Name: [name]. MRN: [id number]  Date: 04/09/2022  DOB: [birth year]  SIMULATION AND TREATMENT PLANNING NOTE    ICD-10-CM   1. 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  Identity was confirmed.  All relevant records and images related to the planned course of therapy were reviewed.  The patient freely provided informed written consent to proceed with treatment after reviewing the details related to the planned course of therapy. The consent form was witnessed and verified by the simulation staff.  Then, the patient was set-up in a stable reproducible  supine position for radiation therapy.  CT images were obtained.  Surface markings were placed.  The CT images were loaded into the planning software.  Then the target and avoidance structures were contoured.  Treatment planning then occurred.  The radiation prescription was entered and confirmed.  Then, I designed and supervised the construction of a total of 3 medically necessary complex treatment device including a custom made thermoplastic mask used for immobilization, and two MLC collimator apertures for radiotherapy from the right and left side, with independent collimation for each to account for beam divergence.  I have requested : Isodose Plan.    PLAN:  The C6 will be treated to 30 Gy in 10 fractions.  ________________________________  Sheral Apley Tammi Klippel, M.D.

## 2022-04-10 ENCOUNTER — Ambulatory Visit
Admission: RE | Admit: 2022-04-10 | Discharge: 2022-04-10 | Disposition: A | Discharge status: Home / Self Care | Payer: PPO | Source: Ambulatory Visit | Attending: Radiation Oncology | Admitting: Radiation Oncology

## 2022-04-10 ENCOUNTER — Encounter: Payer: Self-pay | Admitting: Hematology

## 2022-04-10 ENCOUNTER — Ambulatory Visit: Payer: PPO | Admitting: Radiation Oncology

## 2022-04-10 ENCOUNTER — Encounter (HOSPITAL_COMMUNITY): Payer: Self-pay | Admitting: Hematology

## 2022-04-10 ENCOUNTER — Other Ambulatory Visit: Payer: Self-pay

## 2022-04-10 DIAGNOSIS — Z5112 Encounter for antineoplastic immunotherapy: Secondary | ICD-10-CM | POA: Diagnosis not present

## 2022-04-10 DIAGNOSIS — C7931 Secondary malignant neoplasm of brain: Secondary | ICD-10-CM | POA: Diagnosis present

## 2022-04-10 DIAGNOSIS — Z79899 Other long term (current) drug therapy: Secondary | ICD-10-CM | POA: Diagnosis not present

## 2022-04-10 DIAGNOSIS — Z510 Encounter for antineoplastic radiation therapy: Secondary | ICD-10-CM | POA: Diagnosis not present

## 2022-04-10 DIAGNOSIS — C7951 Secondary malignant neoplasm of bone: Secondary | ICD-10-CM | POA: Diagnosis present

## 2022-04-10 DIAGNOSIS — C649 Malignant neoplasm of unspecified kidney, except renal pelvis: Secondary | ICD-10-CM | POA: Diagnosis present

## 2022-04-10 LAB — RAD ONC ARIA SESSION SUMMARY
Course Elapsed Days: 1
Plan Fractions Treated to Date: 2
Plan Prescribed Dose Per Fraction: 3 Gy
Plan Total Fractions Prescribed: 10
Plan Total Prescribed Dose: 30 Gy
Reference Point Dosage Given to Date: 6 Gy
Reference Point Session Dosage Given: 3 Gy
Session Number: 3

## 2022-04-11 ENCOUNTER — Other Ambulatory Visit: Payer: Self-pay

## 2022-04-11 ENCOUNTER — Ambulatory Visit
Admission: RE | Admit: 2022-04-11 | Discharge: 2022-04-11 | Disposition: A | Discharge status: Home / Self Care | Payer: PPO | Source: Ambulatory Visit | Attending: Radiation Oncology | Admitting: Radiation Oncology

## 2022-04-11 DIAGNOSIS — C649 Malignant neoplasm of unspecified kidney, except renal pelvis: Secondary | ICD-10-CM

## 2022-04-11 DIAGNOSIS — Z5112 Encounter for antineoplastic immunotherapy: Secondary | ICD-10-CM | POA: Diagnosis not present

## 2022-04-11 LAB — RAD ONC ARIA SESSION SUMMARY
Course Elapsed Days: 2
Plan Fractions Treated to Date: 2
Plan Fractions Treated to Date: 2
Plan Fractions Treated to Date: 3
Plan Prescribed Dose Per Fraction: 10 Gy
Plan Prescribed Dose Per Fraction: 10 Gy
Plan Prescribed Dose Per Fraction: 3 Gy
Plan Total Fractions Prescribed: 10
Plan Total Fractions Prescribed: 5
Plan Total Fractions Prescribed: 5
Plan Total Prescribed Dose: 30 Gy
Plan Total Prescribed Dose: 50 Gy
Plan Total Prescribed Dose: 50 Gy
Reference Point Dosage Given to Date: 20 Gy
Reference Point Dosage Given to Date: 20 Gy
Reference Point Dosage Given to Date: 9 Gy
Reference Point Session Dosage Given: 10 Gy
Reference Point Session Dosage Given: 10 Gy
Reference Point Session Dosage Given: 3 Gy
Session Number: 4

## 2022-04-12 ENCOUNTER — Other Ambulatory Visit: Payer: Self-pay

## 2022-04-12 ENCOUNTER — Encounter (HOSPITAL_COMMUNITY): Payer: Self-pay | Admitting: Hematology

## 2022-04-12 ENCOUNTER — Inpatient Hospital Stay: Payer: PPO

## 2022-04-12 ENCOUNTER — Encounter: Payer: Self-pay | Admitting: Hematology

## 2022-04-12 ENCOUNTER — Ambulatory Visit: Payer: PPO | Admitting: Radiation Oncology

## 2022-04-12 ENCOUNTER — Ambulatory Visit
Admission: RE | Admit: 2022-04-12 | Discharge: 2022-04-12 | Disposition: A | Discharge status: Home / Self Care | Payer: PPO | Source: Ambulatory Visit | Attending: Radiation Oncology | Admitting: Radiation Oncology

## 2022-04-12 ENCOUNTER — Inpatient Hospital Stay (HOSPITAL_BASED_OUTPATIENT_CLINIC_OR_DEPARTMENT_OTHER): Payer: PPO | Admitting: Hematology

## 2022-04-12 VITALS — BP 107/74 | HR 81 | Temp 98.6°F | Resp 18

## 2022-04-12 DIAGNOSIS — Z79899 Other long term (current) drug therapy: Secondary | ICD-10-CM | POA: Insufficient documentation

## 2022-04-12 DIAGNOSIS — C649 Malignant neoplasm of unspecified kidney, except renal pelvis: Secondary | ICD-10-CM

## 2022-04-12 DIAGNOSIS — C7951 Secondary malignant neoplasm of bone: Secondary | ICD-10-CM

## 2022-04-12 DIAGNOSIS — C7931 Secondary malignant neoplasm of brain: Secondary | ICD-10-CM | POA: Diagnosis not present

## 2022-04-12 DIAGNOSIS — C787 Secondary malignant neoplasm of liver and intrahepatic bile duct: Secondary | ICD-10-CM | POA: Insufficient documentation

## 2022-04-12 DIAGNOSIS — Z95828 Presence of other vascular implants and grafts: Secondary | ICD-10-CM

## 2022-04-12 DIAGNOSIS — C7800 Secondary malignant neoplasm of unspecified lung: Secondary | ICD-10-CM | POA: Insufficient documentation

## 2022-04-12 DIAGNOSIS — Z5112 Encounter for antineoplastic immunotherapy: Secondary | ICD-10-CM | POA: Insufficient documentation

## 2022-04-12 LAB — COMPREHENSIVE METABOLIC PANEL
ALT: 15 U/L (ref 0–44)
AST: 21 U/L (ref 15–41)
Albumin: 2.9 g/dL — ABNORMAL LOW (ref 3.5–5.0)
Alkaline Phosphatase: 96 U/L (ref 38–126)
Anion gap: 7 (ref 5–15)
BUN: 18 mg/dL (ref 8–23)
CO2: 23 mmol/L (ref 22–32)
Calcium: 8.2 mg/dL — ABNORMAL LOW (ref 8.9–10.3)
Chloride: 103 mmol/L (ref 98–111)
Creatinine, Ser: 0.56 mg/dL — ABNORMAL LOW (ref 0.61–1.24)
GFR, Estimated: >60 mL/min (ref >60)
Glucose, Bld: 119 mg/dL — ABNORMAL HIGH (ref 70–99)
Potassium: 4.1 mmol/L (ref 3.5–5.1)
Sodium: 133 mmol/L — ABNORMAL LOW (ref 135–145)
Total Bilirubin: 0.5 mg/dL (ref 0.3–1.2)
Total Protein: 6.5 g/dL (ref 6.5–8.1)

## 2022-04-12 LAB — RAD ONC ARIA SESSION SUMMARY
Course Elapsed Days: 3
Plan Fractions Treated to Date: 4
Plan Prescribed Dose Per Fraction: 3 Gy
Plan Total Fractions Prescribed: 10
Plan Total Prescribed Dose: 30 Gy
Reference Point Dosage Given to Date: 12 Gy
Reference Point Session Dosage Given: 3 Gy
Session Number: 5

## 2022-04-12 LAB — CBC WITH DIFFERENTIAL/PLATELET
Abs Immature Granulocytes: 0.07 10*3/uL (ref 0.00–0.07)
Basophils Absolute: 0.1 10*3/uL (ref 0.0–0.1)
Basophils Relative: 1 %
Eosinophils Absolute: 0.2 10*3/uL (ref 0.0–0.5)
Eosinophils Relative: 2 %
HCT: 37.4 % — ABNORMAL LOW (ref 39.0–52.0)
Hemoglobin: 12.1 g/dL — ABNORMAL LOW (ref 13.0–17.0)
Immature Granulocytes: 1 %
Lymphocytes Relative: 5 %
Lymphs Abs: 0.6 10*3/uL — ABNORMAL LOW (ref 0.7–4.0)
MCH: 26.2 pg (ref 26.0–34.0)
MCHC: 32.4 g/dL (ref 30.0–36.0)
MCV: 81 fL (ref 80.0–100.0)
Monocytes Absolute: 1 10*3/uL (ref 0.1–1.0)
Monocytes Relative: 8 %
Neutro Abs: 10.1 10*3/uL — ABNORMAL HIGH (ref 1.7–7.7)
Neutrophils Relative %: 83 %
Platelets: 245 10*3/uL (ref 150–400)
RBC: 4.62 MIL/uL (ref 4.22–5.81)
RDW: 17.5 % — ABNORMAL HIGH (ref 11.5–15.5)
WBC: 12.1 10*3/uL — ABNORMAL HIGH (ref 4.0–10.5)
nRBC: 0 % (ref 0.0–0.2)

## 2022-04-12 LAB — MAGNESIUM: Magnesium: 1.9 mg/dL (ref 1.7–2.4)

## 2022-04-12 LAB — TSH: TSH: 1.118 u[IU]/mL (ref 0.350–4.500)

## 2022-04-12 MED ORDER — SODIUM CHLORIDE 0.9 % IV SOLN
3.0000 mg/kg | Freq: Once | INTRAVENOUS | Status: AC
  Administered 2022-04-12: 157 mg via INTRAVENOUS
  Filled 2022-04-12: qty 8

## 2022-04-12 MED ORDER — DIPHENHYDRAMINE HCL 50 MG/ML IJ SOLN
25.0000 mg | Freq: Once | INTRAMUSCULAR | Status: AC
  Administered 2022-04-12: 25 mg via INTRAVENOUS

## 2022-04-12 MED ORDER — SODIUM CHLORIDE 0.9 % IV SOLN
1.0000 mg/kg | Freq: Once | INTRAVENOUS | Status: AC
  Administered 2022-04-12: 50 mg via INTRAVENOUS
  Filled 2022-04-12: qty 10

## 2022-04-12 MED ORDER — HEPARIN SOD (PORK) LOCK FLUSH 100 UNIT/ML IV SOLN
500.0000 [IU] | Freq: Once | INTRAVENOUS | Status: AC | PRN
  Administered 2022-04-12: 500 [IU]

## 2022-04-12 MED ORDER — SODIUM CHLORIDE 0.9 % IV SOLN: Freq: Once | INTRAVENOUS | Status: AC

## 2022-04-12 MED ORDER — FAMOTIDINE IN NACL 20-0.9 MG/50ML-% IV SOLN
20.0000 mg | Freq: Once | INTRAVENOUS | Status: AC
  Administered 2022-04-12: 20 mg via INTRAVENOUS

## 2022-04-12 MED ORDER — SODIUM CHLORIDE 0.9% FLUSH
10.0000 mL | INTRAVENOUS | Status: DC | PRN
  Administered 2022-04-12: 10 mL

## 2022-04-12 NOTE — Patient Instructions (Signed)
[facility]  Discharge Instructions: Thank you for choosing [facility] to provide your oncology and hematology care.  If you have a lab appointment with the [facility], please come in thru the Main Entrance and check in at the main information desk.  Wear comfortable clothing and clothing appropriate for easy access to any Portacath or PICC line.   We strive to give you quality time with your provider. You may need to reschedule your appointment if you arrive late (15 or more minutes).  Arriving late affects you and other patients whose appointments are after yours.  Also, if you miss three or more appointments without notifying the office, you may be dismissed from the clinic at the provider's discretion.      For prescription refill requests, have your pharmacy contact our office and allow 72 hours for refills to be completed.    Today you received the following chemotherapy and/or immunotherapy agents opidvo and Yervoy   To help prevent nausea and vomiting after your treatment, we encourage you to take your nausea medication as directed.  BELOW ARE SYMPTOMS THAT SHOULD BE REPORTED IMMEDIATELY: *FEVER GREATER THAN 100.4 F (38 C) OR HIGHER *CHILLS OR SWEATING *NAUSEA AND VOMITING THAT IS NOT CONTROLLED WITH YOUR NAUSEA MEDICATION *UNUSUAL SHORTNESS OF BREATH *UNUSUAL BRUISING OR BLEEDING *URINARY PROBLEMS (pain or burning when urinating, or frequent urination) *BOWEL PROBLEMS (unusual diarrhea, constipation, pain near the anus) TENDERNESS IN MOUTH AND THROAT WITH OR WITHOUT PRESENCE OF ULCERS (sore throat, sores in mouth, or a toothache) UNUSUAL RASH, SWELLING OR PAIN  UNUSUAL VAGINAL DISCHARGE OR ITCHING   Items with * indicate a potential emergency and should be followed up as soon as possible or go to the Emergency Department if any problems should occur.  Please show the CHEMOTHERAPY ALERT CARD or IMMUNOTHERAPY ALERT CARD at check-in to the  Emergency Department and triage nurse.  Should you have questions after your visit or need to cancel or reschedule your appointment, please contact [facility] [phone]  and follow the prompts.  Office hours are 8:00 a.m. to 4:30 p.m. Monday - Friday. Please note that voicemails left after 4:00 p.m. may not be returned until the following business day.  We are closed weekends and major holidays. You have access to a nurse at all times for urgent questions. Please call the main number to the clinic [phone] and follow the prompts.  For any non-urgent questions, you may also contact your provider using MyChart. We now offer e-Visits for anyone 61 and older to request care online for non-urgent symptoms. For details visit [email or web address].   Also download the MyChart app! Go to the app store, search "MyChart", open the app, select [facility], and log in with your MyChart username and password.  Masks are optional in the cancer centers. If you would like for your care team to wear a mask while they are taking care of you, please let them know. For doctor visits, patients may have with them one support person who is at least 67 years old. At this time, visitors are not allowed in the infusion area.  Nivolumab Injection What is this medication? NIVOLUMAB (nye VOL ue mab) treats some types of cancer. It works by helping your immune system slow or stop the spread of cancer cells. It is a monoclonal antibody. This medicine may be used for other purposes; ask your health care provider or pharmacist if you have questions. COMMON BRAND NAME(S):  Opdivo What should I tell my care team before I take this medication? They need to know if you have any of these conditions: Allogeneic stem cell transplant (uses someone else's stem cells) Autoimmune diseases, such as Crohn disease, ulcerative colitis, lupus History of chest radiation Nervous system problems, such as  Guillain-Barre syndrome or myasthenia gravis Organ transplant An unusual or allergic reaction to nivolumab, other medications, foods, dyes, or preservatives Pregnant or trying to get pregnant Breast-feeding How should I use this medication? This medication is infused into a vein. It is given in a hospital or clinic setting. A special MedGuide will be given to you before each treatment. Be sure to read this information carefully each time. Talk to your care team about the use of this medication in children. While it may be prescribed for children as young as 12 years for selected conditions, precautions do apply. Overdosage: If you think you have taken too much of this medicine contact a poison control center or emergency room at once. NOTE: This medicine is only for you. Do not share this medicine with others. What if I miss a dose? Keep appointments for follow-up doses. It is important not to miss your dose. Call your care team if you are unable to keep an appointment. What may interact with this medication? Interactions have not been studied. This list may not describe all possible interactions. Give your health care provider a list of all the medicines, herbs, non-prescription drugs, or dietary supplements you use. Also tell them if you smoke, drink alcohol, or use illegal drugs. Some items may interact with your medicine. What should I watch for while using this medication? Your condition will be monitored carefully while you are receiving this medication. You may need blood work while taking this medication. This medication may cause serious skin reactions. They can happen weeks to months after starting the medication. Contact your care team right away if you notice fevers or flu-like symptoms with a rash. The rash may be red or purple and then turn into blisters or peeling of the skin. You may also notice a red rash with swelling of the face, lips, or lymph nodes in your neck or under your  arms. Tell your care team right away if you have any change in your eyesight. Talk to your care team if you are pregnant or think you might be pregnant. A negative pregnancy test is required before starting this medication. A reliable form of contraception is recommended while taking this medication and for 5 months after the last dose. Talk to your care team about effective forms of contraception. Do not breast-feed while taking this medication and for 5 months after the last dose. What side effects may I notice from receiving this medication? Side effects that you should report to your care team as soon as possible: Allergic reactions--skin rash, itching, hives, swelling of the face, lips, tongue, or throat Dry cough, shortness of breath or trouble breathing Eye pain, redness, irritation, or discharge with blurry or decreased vision Heart muscle inflammation--unusual weakness or fatigue, shortness of breath, chest pain, fast or irregular heartbeat, dizziness, swelling of the ankles, feet, or hands Hormone gland problems--headache, sensitivity to light, unusual weakness or fatigue, dizziness, fast or irregular heartbeat, increased sensitivity to cold or heat, excessive sweating, constipation, hair loss, increased thirst or amount of urine, tremors or shaking, irritability Infusion reactions--chest pain, shortness of breath or trouble breathing, feeling faint or lightheaded Kidney injury (glomerulonephritis)--decrease in the amount of urine, red  or dark brown urine, foamy or bubbly urine, swelling of the ankles, hands, or feet Liver injury--right upper belly pain, loss of appetite, nausea, light-colored stool, dark yellow or brown urine, yellowing skin or eyes, unusual weakness or fatigue Pain, tingling, or numbness in the hands or feet, muscle weakness, change in vision, confusion or trouble speaking, loss of balance or coordination, trouble walking, seizures Rash, fever, and swollen lymph  nodes Redness, blistering, peeling, or loosening of the skin, including inside the mouth Sudden or severe stomach pain, bloody diarrhea, fever, nausea, vomiting Side effects that usually do not require medical attention (report these to your care team if they continue or are bothersome): Bone, joint, or muscle pain Diarrhea Fatigue Loss of appetite Nausea Skin rash This list may not describe all possible side effects. Call your doctor for medical advice about side effects. You may report side effects to FDA at 1-800-FDA-1088. Where should I keep my medication? This medication is given in a hospital or clinic. It will not be stored at home. NOTE: This sheet is a summary. It may not cover all possible information. If you have questions about this medicine, talk to your doctor, pharmacist, or health care provider.  2023 Elsevier/Gold Standard (2021-07-21 00:00:00)

## 2022-04-12 NOTE — Progress Notes (Signed)
Pt presents today for Opdivo and Yervoy per provider's order. Vital signs and labs WNL for treatment today. Okay to proceed with treatment today.   Marchelle Gearing given today per MD orders. Tolerated infusion without adverse affects. Vital signs stable. No complaints at this time. Discharged from clinic via wheelchair in stable condition. Alert and oriented x 3. F/U with [facility] as scheduled.

## 2022-04-12 NOTE — Progress Notes (Signed)
Ipilimumab (YERVOY) Patient Monitoring Assessment   Is the patient experiencing any of the following general symptoms?:  '[ ]'$ Difficulty performing normal activities '[ ]'$ Feeling sluggish or cold all the time '[ ]'$ Unusual weight gain '[ ]'$ Constant or unusual headaches '[ ]'$ Feeling dizzy or faint '[ ]'$ Changes in eyesight (blurry vision, double vision, or other vision problems) '[ ]'$ Changes in mood or behavior (ex: decreased sex drive, irritability, or forgetfulness) '[ ]'$ Starting new medications (ex: steroids, other medications that lower immune response) [ *]Patient is not experiencing any of the general symptoms above.   Gastrointestinal  Patient is having once weekly bowel movements each day.  Is this different from baseline? '[ ]'$ Yes [ *]No Are your stools watery or do they have a foul smell? '[ ]'$ Yes [ *]No Have you seen blood in your stools? '[ ]'$ Yes [ *]No Are your stools dark, tarry, or sticky? '[ ]'$ Yes [ *]No Are you having pain or tenderness in your belly? '[ ]'$ Yes [* ]No  Skin Does your skin itch? '[ ]'$ Yes [ *]No Do you have a rash? '[ ]'$ Yes [ *]No Has your skin blistered and/or peeled? '[ ]'$ Yes [ *]No Do you have sores in your mouth? '[ ]'$ Yes [* ]No  Hepatic Has your urine been dark or tea colored? '[ ]'$ Yes [* ]No Have you noticed that your skin or the whites of your eyes are turning yellow? '[ ]'$ Yes [ *]No Are you bleeding or bruising more easily than normal? '[ ]'$ Yes [ *]No Are you nauseous and/or vomiting? '[ ]'$ Yes [ *]No Do you have pain on the right side of your stomach? '[ ]'$ Yes [* ]No  Neurologic  Are you having unusual weakness of legs, arms, or face? '[ ]'$ Yes [* ]No Are you having numbness or tingling in your hands or feet? '[ ]'$ Yes [ *]No  Terrace [place]

## 2022-04-12 NOTE — Patient Instructions (Addendum)
[facility] at [facility] Discharge Instructions   You were seen and examined today by Dr. Delton Coombes.  He reviewed the results of your lab work which are normal/stable.   We will proceed with your treatment today.  After today's treatment, you will be scheduled every 4 weeks to receive Opdivo and Zometa only.   We will repeat bone scan and CT scan prior to your next visit.   Return as scheduled.     Thank you for choosing [facility] at [facility] to provide your oncology and hematology care.  To afford each patient quality time with our provider, please arrive at least 15 minutes before your scheduled appointment time.   If you have a lab appointment with the [facility] please come in thru the Main Entrance and check in at the main information desk.  You need to re-schedule your appointment should you arrive 10 or more minutes late.  We strive to give you quality time with our providers, and arriving late affects you and other patients whose appointments are after yours.  Also, if you no show three or more times for appointments you may be dismissed from the clinic at the providers discretion.     Again, thank you for choosing [facility].  Our hope is that these requests will decrease the amount of time that you wait before being seen by our physicians.       _____________________________________________________________  Should you have questions after your visit to [facility], please contact our office at [phone] and follow the prompts.  Our office hours are 8:00 a.m. and 4:30 p.m. Monday - Friday.  Please note that voicemails left after 4:00 p.m. may not be returned until the following business day.  We are closed weekends and major holidays.  You do have access to a nurse 24-7, just call the main number to the clinic [phone] and do not press any options, hold on the line and a nurse will answer  the phone.    For prescription refill requests, have your pharmacy contact our office and allow 72 hours.    Due to Covid, you will need to wear a mask upon entering the hospital. If you do not have a mask, a mask will be given to you at the Main Entrance upon arrival. For doctor visits, patients may have 1 support person age 26 or older with them. For treatment visits, patients can not have anyone with them due to social distancing guidelines and our immunocompromised population.

## 2022-04-12 NOTE — Progress Notes (Signed)
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 There is involvement of the medial clivus, occipital condyle, petrous apex, with extension into the RIGHT cavernous sinus and sphenoid sinus...   10/21/2015 Imaging   CT abd/pelvis- Enlarged irregular enhancing mass partially exophytic from the RIGHT kidney consistent with RENAL CELL CARCINOMA. No evidence of involvement of the RIGHT renal vein.No retroperitoneal lymphadenopathy.   10/21/2015 Imaging   CT chest- No evidence thoracic metastasis. Nodule along the RIGHT oblique fissure likely represents a benign intrafissural lymph node   10/21/2015 Imaging   MRI brain- Destructive right central/posterior skullbase mass consistent with metastasis as previously described.   10/23/2015 Initial Diagnosis   Renal cell carcinoma (Gregory [name])   11/01/2015 Procedure   US guided biopsy by IR   11/02/2015 Pathology Results   CLEAR CELL RENAL CELL CARCINOMA, WHO NUCLEAR GRADE 2   Metastatic renal cell carcinoma to bone (Gregory [name])  11/06/2015 Initial Diagnosis   Metastatic renal cell carcinoma to bone (Gregory [name])   02/08/2022 -  Chemotherapy   Patient is on Treatment Plan : RENAL CELL CARCINOMA Nivolumab + Ipilimumab q21d / Nivolumab q28d       CANCER STAGING:  Cancer Staging  Metastatic renal cell carcinoma to bone [facility]) Staging form: Kidney, AJCC 8th Edition - Clinical stage from 01/03/2022: Stage IV (cT1b, cNX, pM1) - Unsigned   INTERVAL HISTORY:  Gregory [name]., a 66 y.o. male,  is seen today for toxicity assessment and cycle 4 of immunotherapy with ipilimumab and nivolumab.  He had very mild diarrhea which lasted about a day.  He started radiation for the C6 extradural mass, for treatment today.  He complains of left leg pain more than the left arm particularly at nighttime.  Left leg is weak and he needs help when walking.  He has been eating reasonably well and his weight has been stable in the last 3 weeks.   REVIEW OF SYSTEMS:  Review of Systems  Constitutional:  Negative for appetite change, fatigue and unexpected weight change.  Respiratory:  Negative for cough and shortness of breath.   Gastrointestinal:  Positive for constipation and diarrhea.  Musculoskeletal:  Positive for arthralgias (4/10 L leg and shoulder). Negative for back pain and myalgias.  Neurological:  Negative for numbness.  Psychiatric/Behavioral:  Negative for sleep disturbance.   All other systems reviewed and are negative.   PAST MEDICAL/SURGICAL HISTORY:  Past Medical History:  Diagnosis Date   Port-A-Cath in place 02/04/2022   Renal cell carcinoma (Gregory [name]) 10/23/2015   Skull lesion 10/20/2015   metastatic renal cell carcinoma   Past Surgical History:  Procedure Laterality Date   PORTACATH PLACEMENT Right 02/23/2022   Procedure: INSERTION PORT-A-CATH;  Surgeon: Aviva Signs, MD;  Location: [facility];  Service: General;  Laterality: Right;   robotic radical right nephrectomy      SOCIAL HISTORY:  Social History   Socioeconomic History   Marital status: Single    Spouse name: Not on file   Number of children: Not on file   Years  of education: Not on file   Highest education level: Not on file  Occupational History   Occupation: Programmer, systems: OTHER    Comment: [name]  Tobacco Use   Smoking status: Every Day    Packs/day: 0.50    Years: 45.00    Total pack years: 22.50    Types: Cigarettes   Smokeless tobacco: Never  Vaping Use   Vaping Use: Never used   Substance and Sexual Activity   Alcohol use: Not Currently    Alcohol/week: 20.0 standard drinks of alcohol    Types: 20 Shots of liquor per week    Comment: 1-5 shot of liquor per night   Drug use: Yes    Frequency: 1.0 times per week    Comment: occasionally   Sexual activity: Yes  Other Topics Concern   Not on file  Social History Narrative   Not on file   Social Determinants of Health   Financial Resource Strain: Not on file  Food Insecurity: Not on file  Transportation Needs: Not on file  Physical Activity: Not on file  Stress: Not on file  Social Connections: Not on file  Intimate Partner Violence: Not on file    FAMILY HISTORY:  Family History  Problem Relation Age of Onset   CAD Mother    CAD Father    Cancer Paternal Uncle        stomach cancer   Cancer Paternal Uncle        unknown    CURRENT MEDICATIONS:  Current Outpatient Medications  Medication Sig Dispense Refill   Ascorbic Acid (VITAMIN C PO) Take 2 tablets by mouth daily.     Biotin 2500 MCG CAPS Take 5,000 mcg by mouth daily.     Cholecalciferol (VITAMIN D) 50 MCG (2000 UT) tablet Take 4,000 Units by mouth daily.     Cyanocobalamin (B-12) 3000 MCG CAPS Take 6,000 mcg by mouth daily.     dexamethasone (DECADRON) 4 MG tablet Take 1 tablet (4 mg total) by mouth daily. 30 tablet 3   ELDERBERRY PO Take 2 capsules by mouth daily.     gabapentin (NEURONTIN) 100 MG capsule Take 1 capsule (100 mg total) by mouth 3 (three) times daily. 90 capsule 3   Ipilimumab (YERVOY IV) Inject into the vein every 21 ( twenty-one) days. X 4 cycles     lidocaine-prilocaine (EMLA) cream Apply a small amount to port a cath site and cover with plastic wrap 1 hour prior to infusion appointments (Patient not taking: Reported on 03/01/2022) 30 g 3   LORazepam (ATIVAN) 1 MG tablet Take 1 tablet (1 mg total) by mouth at bedtime as needed for anxiety. 30 tablet 0   Nivolumab (OPDIVO IV) Inject into the vein every 21 ( twenty-one)  days.     Oxycodone HCl 10 MG TABS Take 1 tablet (10 mg total) by mouth every 4 (four) hours as needed. 168 tablet 0   senna-docusate (SENOKOT-S) 8.6-50 MG tablet Take 1 tablet by mouth 2 (two) times daily.     No current facility-administered medications for this visit.    ALLERGIES:  No Known Allergies  PHYSICAL EXAM:  Performance status (ECOG): 1 - Symptomatic but completely ambulatory  There were no vitals filed for this visit. Wt Readings from Last 3 Encounters:  03/22/22 115 lb 9.6 oz (52.4 kg)  03/05/22 125 lb (56.7 kg)  03/01/22 126 lb (57.2 kg)   Physical Exam Vitals reviewed.  Constitutional:  Appearance: Normal appearance.     Comments: In wheelchair  Cardiovascular:     Rate and Rhythm: Normal rate and regular rhythm.     Pulses: Normal pulses.     Heart sounds: Normal heart sounds.  Pulmonary:     Effort: Pulmonary effort is normal.     Breath sounds: Normal breath sounds.  Musculoskeletal:     Right lower leg: No edema.     Left lower leg: No edema.  Neurological:     General: No focal deficit present.     Mental Status: He is alert and oriented to person, place, and time.  Psychiatric:        Mood and Affect: Mood normal.        Behavior: Behavior normal.     LABORATORY DATA:  I have reviewed the labs as listed.     Latest Ref Rng & Units 03/22/2022    9:23 AM 03/05/2022    4:44 PM 03/01/2022    8:47 AM  CBC  WBC 4.0 - 10.5 K/uL 13.2  9.1  9.9   Hemoglobin 13.0 - 17.0 g/dL 12.6  11.5  12.4   Hematocrit 39.0 - 52.0 % 38.8  35.7  38.4   Platelets 150 - 400 K/uL 259  279  302       Latest Ref Rng & Units 03/22/2022    9:23 AM 03/05/2022    4:44 PM 03/01/2022    8:47 AM  CMP  Glucose 70 - 99 mg/dL 121  115  123   BUN 8 - 23 mg/dL '15  16  17   '$ Creatinine 0.61 - 1.24 mg/dL 0.65  0.77  0.74   Sodium 135 - 145 mmol/L 134  133  133   Potassium 3.5 - 5.1 mmol/L 4.2  4.1  4.1   Chloride 98 - 111 mmol/L 102  99  101   CO2 22 - 32 mmol/L '25  25  26    '$ Calcium 8.9 - 10.3 mg/dL 8.8  8.7  8.6   Total Protein 6.5 - 8.1 g/dL 6.6   6.7   Total Bilirubin 0.3 - 1.2 mg/dL 0.5   0.5   Alkaline Phos 38 - 126 U/L 100   105   AST 15 - 41 U/L 24   29   ALT 0 - 44 U/L 15   18     DIAGNOSTIC IMAGING:  I have independently reviewed the scans and discussed with the patient. MR Cervical Spine W Wo Contrast  Result Date: 04/06/2022 CLINICAL DATA:  Metastatic renal cell carcinoma. Suspected epidural tumor and cord compression in the cervical spine on recent thoracic MRI. EXAM: MRI CERVICAL SPINE WITHOUT AND WITH CONTRAST TECHNIQUE: Multiplanar and multiecho pulse sequences of the cervical spine, to include the craniocervical junction and cervicothoracic junction, were obtained without and with intravenous contrast. CONTRAST:  29m MULTIHANCE GADOBENATE DIMEGLUMINE 529 MG/ML IV SOLN COMPARISON:  Thoracic MRI 03/31/2022. Chest CT 01/30/2022. Brain MRI 02/05/2022. FINDINGS: Alignment: Straightening without focal angulation or significant listhesis. Vertebrae: Widespread osseous metastatic disease throughout the cervical spine. There are pathologic fractures with osseous retropulsion and epidural tumor at the C6 and T1 levels. Significant extension of tumor into the left C6 pedicle and anterolateral paraspinal soft tissues. Contiguous tumor extends into the C7 vertebral body. Stable mild pathologic fracture at T1. Cord: As seen on previous thoracic MRI, there is cord compression at C6 with narrowing of the AP diameter of the canal to 6 mm. No definite abnormal signal  or enhancement within the cord. Posterior Fossa, vertebral arteries, paraspinal tissues: 2 enhancing metastases within the right cerebellum are partially imaged, similar to previous brain MRI. No evidence of tonsillar herniation. Bilateral vertebral artery flow voids are present. Disc levels: Mild underlying multilevel spondylosis. There is no spinal stenosis at or above C4-5. Mild foraminal narrowing is present  bilaterally at C3-4 due to uncinate spurring. C5-6: Mild disc bulging and uncinate spurring. As above, pathologic fracture of C6 with anterior and lateral paraspinal and epidural tumor. Resulting cord compression and severe left foraminal narrowing with probable left C6 nerve root encroachment. C6-7: The disc space is largely engulfed by tumor. Cord compression with moderate to severe foraminal narrowing bilaterally by epidural and paraspinal tumor. C7-T1: Asymmetric facet hypertrophy. No significant spinal stenosis or nerve root encroachment. IMPRESSION: 1. As seen on recent thoracic MRI, there is extensive osseous metastatic disease throughout the cervical spine. At C6, there is a pathologic fracture with osseous retropulsion, epidural and paraspinal tumor eccentric to the left causing cord compression. 2. No abnormal cord signal or enhancement identified. 3. Right cerebellar osseous metastases, similar to previous brain MRI. 4. These results will be called to the ordering clinician or representative by the Radiologist Assistant, and communication documented in the PACS or [employer]. Electronically Signed   By: Richardean Sale M.D.   On: 04/06/2022 14:27   MR THORACIC SPINE W WO CONTRAST  Result Date: 04/02/2022 CLINICAL DATA:  History of renal cell carcinoma. Severe back pain with bilateral leg and arm pain. EXAM: MRI THORACIC WITHOUT AND WITH CONTRAST TECHNIQUE: Multiplanar and multiecho pulse sequences of the thoracic spine were obtained without and with intravenous contrast. CONTRAST:  11m MULTIHANCE GADOBENATE DIMEGLUMINE 529 MG/ML IV SOLN COMPARISON:  Lumbar spine MRI 03/14/2022, CT chest/abdomen/pelvis 01/30/2022 FINDINGS: Alignment:  Normal. Vertebrae: There is extensive osseous metastatic disease throughout the thoracic spine involving the vertebral bodies and posterior elements. There is pathologic compression deformity of the T10 vertebral body with up to approximately 30% loss of vertebral  body height centrally, similar to the prior CT chest from 01/30/2022. There is pathologic compression deformity of the T5 vertebral body with up to approximately 30% loss of vertebral body height centrally which appears worsened since the prior CT from 01/30/2022. There is no other evidence of pathologic fracture. There is no bony retropulsion. There is epidural tumor at T10 and T12, more pronounced at T12 where it measures up to approximately 7 mm in thickness in the right aspect of the spinal canal and results in mild spinal canal stenosis with flattening of the right ventral lateral cord surface without frank compression. There is no definite neural foraminal extension of tumor. The cervical spine is incompletely imaged, but there is extensive osseous metastatic disease with suspected epidural tumor at C6 with cord compression seen on the count sequence (2-4). There is extensive osseous metastatic disease throughout the ribs. Cord: Normal in signal and morphology. There is no frank cord compression. There is no abnormal cord enhancement. Paraspinal and other soft tissues: There is signal abnormality in the right lower lobe consistent with the known mass and metastatic disease and probable postobstructive atelectasis. There is extensive abnormal enhancing soft tissue in the region of the left scapula consistent with osseous metastatic disease and surrounding soft tissue tumor as seen on prior CT. Disc levels: There is overall mild multilevel degenerative endplate change and facet arthropathy throughout the thoracic spine. There is mild spinal canal stenosis at T10 and T12 due to epidural tumor. There  is no other significant spinal canal stenosis. There is overall mild multilevel facet arthropathy with up to moderate bilateral neural foraminal stenosis at T10-T11. IMPRESSION: 1. Extensive osseous metastatic disease throughout the thoracic spine with epidural tumor at T10 and T12, more pronounced at T12 where there  is mild spinal canal stenosis with flattening of the right ventrolateral cord surface but no frank cord compression or cord signal abnormality. 2. Osseous metastatic disease with epidural tumor and cord compression at C6, incompletely evaluated. Recommend dedicated cervical spine MRI with and without contrast for evaluation of the cord compression, and neurosurgical referral. 3. Pathologic compression deformities of the T5 and T10 vertebral bodies with approximately 30% loss of vertebral body height at both levels, progressed at T5 since the CT abdomen/pelvis from 01/30/2022 and not significantly changed at T10. No significant bony retropulsion at these levels. 4. Irregular enhancing tissue in the region of the left scapula consistent with osseous metastatic disease in the scapula with extensive surrounding soft tissue tumor as seen on prior CT chest. 5. Right lower lobe mass and probable postobstructive atelectasis as seen on prior CT. These results were called by telephone at the time of interpretation on 04/02/2022 at 2:03 pm to provider [facility] , who verbally acknowledged these results. Electronically Signed   By: Valetta Mole M.D.   On: 04/02/2022 14:04   MR Lumbar Spine W Wo Contrast  Result Date: 03/16/2022 CLINICAL DATA:  Metastatic renal carcinoma EXAM: MRI LUMBAR SPINE WITHOUT AND WITH CONTRAST TECHNIQUE: Multiplanar and multiecho pulse sequences of the lumbar spine were obtained without and with intravenous contrast. CONTRAST:  27m MULTIHANCE GADOBENATE DIMEGLUMINE 529 MG/ML IV SOLN COMPARISON:  None Available. FINDINGS: Segmentation:  Standard. Alignment:  No significant listhesis. Vertebrae: Extensive metastatic disease involving included osseous structures. Extraosseous extension is most apparent at T12 and L4. Vertebral body heights are similar to May CT abdomen. Conus medullaris and cauda equina: Conus extends to the L1 level. Conus and cauda equina appear normal. No abnormal  intrathecal enhancement. Paraspinal and other soft tissues: Right nephrectomy. Disc levels: T12-L1: Right ventral and lateral epidural disease at the T12 level effaces the right ventral subarachnoid space and mildly encroaches on the right T12-L1 neural foramen. No canal stenosis at disc level. No left foraminal narrowing. L1-L2: No canal or foraminal stenosis. L2-L3: Disc bulge. Minor canal stenosis. No significant foraminal stenosis. L3-L4: Disc bulge. Facet arthropathy. Mild canal stenosis. No right foraminal stenosis. Mild left foraminal stenosis. Below disc level, there is left ventral epidural disease encroaching on the spinal canal including traversing left L4 nerve root. L4-L5: Disc bulge with small central protrusion. Facet arthropathy. Mild canal stenosis. Partial effacement of subarticular recesses. Mild foraminal stenosis. L5-S1: Disc bulge with superimposed central extrusion extending just below disc level. No canal stenosis. Slight effacement of subarticular recesses. Moderate right and mild left foraminal stenosis. IMPRESSION: Extensive osseous metastatic disease. Vertebral body heights are similar to May CT abdomen. Epidural disease is present and most apparent at T12 and L4 without high-grade canal narrowing. Electronically Signed   By: PMacy MisM.D.   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  PT1b PNx. - He was followed by Dr. SAlen Blew last seen on 05/08/2017. - He reported left shoulder pain  for the last couple of years. - He was evaluated by Dr. Chauncey Reading and then will and MRI was ordered. - Left shoulder MRI (12/23/2021): Large heterogeneous mass lesion with soft tissue extension emanating from scapula.  Multiple  rib lesions and lesion in the humerus. - He was evaluated by Dr. Tammi Klippel on 12/29/2021 and was recommended palliative XRT. - CT CAP (01/30/2022): Extensive bony metastatic disease, posterior cortical disruption and central canal extension greatest at T12 and L3.  Small pulmonary nodules bilaterally.  Hepatic metastatic disease.  Left renal lesions either metastatic or Gregory primary. - Bone scan (01/30/2022): Findings consistent with widespread bony metastatic disease. - IMDC intermediate risk group - Ipilimumab and nivolumab cycle 1 started on 02/08/2022. - SRS of the brain lesions on 02/12/2022    Social/family history: - He is seen today with his girlfriend.  He worked as Clinical biochemist for 46 years.  He is a current active smoker, currently smoking half pack per day.  Previously smoked up to 2 packs/day for 50 years. - Mother had throat cancer.  2 paternal uncles and 1 paternal aunt had cancers.   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 Tammi Klippel.  He has received 3/10 treatments and will be receiving full treatment today. - He did not experience any major immunotherapy related side effects. - Reviewed labs today which showed normal LFTs and creatinine.  CBC was grossly normal.  TSH was 1.1. - I have reviewed MRI of the C-spine which showed C6 pathological fracture and epidural tumor. - He will proceed with cycle 4 of ipilimumab and nivolumab today.  RTC 3 weeks for follow-up.  I plan to repeat CT CAP with contrast and bone scan prior to next visit.  2.  Left shoulder pain: - He has left leg pain more than left arm pain. - Continue oxycodone 10 mg every 4-6 hours as needed.  3.  Weight loss: - He lost 10 pounds after cycle 2.  He reports that he has been eating well.  He did not start taking Megace. - His weight has been stable in the last 3 weeks.  4.  Bone metastasis: - Calcium  today is 8.2.  Continue Zometa every 4-6 weeks.   Orders placed this encounter:  No orders of the defined types were placed in this encounter.    Derek Jack, MD [place] [phone]

## 2022-04-13 ENCOUNTER — Ambulatory Visit: Payer: PPO

## 2022-04-13 ENCOUNTER — Other Ambulatory Visit: Payer: Self-pay | Admitting: Radiation Oncology

## 2022-04-13 ENCOUNTER — Ambulatory Visit
Admission: RE | Admit: 2022-04-13 | Discharge: 2022-04-13 | Disposition: A | Discharge status: Home / Self Care | Payer: PPO | Source: Ambulatory Visit | Attending: Radiation Oncology | Admitting: Radiation Oncology

## 2022-04-13 ENCOUNTER — Other Ambulatory Visit: Payer: Self-pay

## 2022-04-13 DIAGNOSIS — Z5112 Encounter for antineoplastic immunotherapy: Secondary | ICD-10-CM | POA: Diagnosis not present

## 2022-04-13 DIAGNOSIS — C7951 Secondary malignant neoplasm of bone: Secondary | ICD-10-CM

## 2022-04-13 LAB — RAD ONC ARIA SESSION SUMMARY
Course Elapsed Days: 4
Plan Fractions Treated to Date: 3
Plan Fractions Treated to Date: 3
Plan Fractions Treated to Date: 5
Plan Prescribed Dose Per Fraction: 10 Gy
Plan Prescribed Dose Per Fraction: 10 Gy
Plan Prescribed Dose Per Fraction: 3 Gy
Plan Total Fractions Prescribed: 10
Plan Total Fractions Prescribed: 5
Plan Total Fractions Prescribed: 5
Plan Total Prescribed Dose: 30 Gy
Plan Total Prescribed Dose: 50 Gy
Plan Total Prescribed Dose: 50 Gy
Reference Point Dosage Given to Date: 15 Gy
Reference Point Dosage Given to Date: 30 Gy
Reference Point Dosage Given to Date: 30 Gy
Reference Point Session Dosage Given: 10 Gy
Reference Point Session Dosage Given: 10 Gy
Reference Point Session Dosage Given: 3 Gy
Session Number: 6

## 2022-04-13 MED ORDER — CEPHALEXIN 500 MG PO CAPS: 500.0000 mg | ORAL_CAPSULE | Freq: Four times a day (QID) | ORAL | 0 refills | Status: DC

## 2022-04-16 ENCOUNTER — Other Ambulatory Visit: Payer: Self-pay

## 2022-04-16 ENCOUNTER — Ambulatory Visit
Admission: RE | Admit: 2022-04-16 | Discharge: 2022-04-16 | Disposition: A | Discharge status: Home / Self Care | Payer: PPO | Source: Ambulatory Visit | Attending: Radiation Oncology | Admitting: Radiation Oncology

## 2022-04-16 DIAGNOSIS — C7949 Secondary malignant neoplasm of other parts of nervous system: Secondary | ICD-10-CM | POA: Diagnosis not present

## 2022-04-16 DIAGNOSIS — R29898 Other symptoms and signs involving the musculoskeletal system: Secondary | ICD-10-CM | POA: Diagnosis not present

## 2022-04-16 LAB — RAD ONC ARIA SESSION SUMMARY
Course Elapsed Days: 7
Plan Fractions Treated to Date: 6
Plan Prescribed Dose Per Fraction: 3 Gy
Plan Total Fractions Prescribed: 10
Plan Total Prescribed Dose: 30 Gy
Reference Point Dosage Given to Date: 18 Gy
Reference Point Session Dosage Given: 3 Gy
Session Number: 7

## 2022-04-17 ENCOUNTER — Telehealth: Payer: Self-pay | Admitting: Radiation Oncology

## 2022-04-17 ENCOUNTER — Other Ambulatory Visit: Payer: Self-pay

## 2022-04-17 ENCOUNTER — Emergency Department (HOSPITAL_COMMUNITY): Payer: PPO

## 2022-04-17 ENCOUNTER — Encounter (HOSPITAL_COMMUNITY): Payer: Self-pay

## 2022-04-17 ENCOUNTER — Ambulatory Visit: Payer: PPO | Admitting: Radiation Oncology

## 2022-04-17 ENCOUNTER — Telehealth (HOSPITAL_COMMUNITY): Payer: Self-pay | Admitting: *Deleted

## 2022-04-17 ENCOUNTER — Inpatient Hospital Stay (HOSPITAL_COMMUNITY)
Admission: EM | Admit: 2022-04-17 | Discharge: 2022-05-20 | DRG: 055 | Disposition: A | Discharge status: Skilled Nursing Facility | Payer: PPO | Attending: Internal Medicine | Admitting: Internal Medicine

## 2022-04-17 ENCOUNTER — Encounter: Payer: Self-pay | Admitting: Hematology

## 2022-04-17 ENCOUNTER — Encounter (HOSPITAL_COMMUNITY): Payer: Self-pay | Admitting: Hematology

## 2022-04-17 ENCOUNTER — Ambulatory Visit: Payer: PPO

## 2022-04-17 DIAGNOSIS — R29898 Other symptoms and signs involving the musculoskeletal system: Principal | ICD-10-CM

## 2022-04-17 DIAGNOSIS — R636 Underweight: Secondary | ICD-10-CM | POA: Diagnosis present

## 2022-04-17 DIAGNOSIS — Z8249 Family history of ischemic heart disease and other diseases of the circulatory system: Secondary | ICD-10-CM

## 2022-04-17 DIAGNOSIS — F17210 Nicotine dependence, cigarettes, uncomplicated: Secondary | ICD-10-CM | POA: Diagnosis present

## 2022-04-17 DIAGNOSIS — C7951 Secondary malignant neoplasm of bone: Secondary | ICD-10-CM | POA: Diagnosis present

## 2022-04-17 DIAGNOSIS — F17200 Nicotine dependence, unspecified, uncomplicated: Secondary | ICD-10-CM | POA: Diagnosis present

## 2022-04-17 DIAGNOSIS — Z79899 Other long term (current) drug therapy: Secondary | ICD-10-CM | POA: Diagnosis not present

## 2022-04-17 DIAGNOSIS — M5416 Radiculopathy, lumbar region: Secondary | ICD-10-CM | POA: Diagnosis not present

## 2022-04-17 DIAGNOSIS — Z681 Body mass index (BMI) 19 or less, adult: Secondary | ICD-10-CM

## 2022-04-17 DIAGNOSIS — Z993 Dependence on wheelchair: Secondary | ICD-10-CM

## 2022-04-17 DIAGNOSIS — R54 Age-related physical debility: Secondary | ICD-10-CM | POA: Diagnosis present

## 2022-04-17 DIAGNOSIS — M8458XA Pathological fracture in neoplastic disease, other specified site, initial encounter for fracture: Secondary | ICD-10-CM | POA: Diagnosis not present

## 2022-04-17 DIAGNOSIS — F40240 Claustrophobia: Secondary | ICD-10-CM | POA: Diagnosis present

## 2022-04-17 DIAGNOSIS — G9529 Other cord compression: Secondary | ICD-10-CM | POA: Diagnosis not present

## 2022-04-17 DIAGNOSIS — D631 Anemia in chronic kidney disease: Secondary | ICD-10-CM | POA: Diagnosis present

## 2022-04-17 DIAGNOSIS — R64 Cachexia: Secondary | ICD-10-CM | POA: Diagnosis present

## 2022-04-17 DIAGNOSIS — C787 Secondary malignant neoplasm of liver and intrahepatic bile duct: Secondary | ICD-10-CM | POA: Diagnosis present

## 2022-04-17 DIAGNOSIS — C7802 Secondary malignant neoplasm of left lung: Secondary | ICD-10-CM | POA: Diagnosis not present

## 2022-04-17 DIAGNOSIS — Z85528 Personal history of other malignant neoplasm of kidney: Secondary | ICD-10-CM

## 2022-04-17 DIAGNOSIS — C7949 Secondary malignant neoplasm of other parts of nervous system: Principal | ICD-10-CM | POA: Diagnosis present

## 2022-04-17 DIAGNOSIS — N182 Chronic kidney disease, stage 2 (mild): Secondary | ICD-10-CM | POA: Diagnosis present

## 2022-04-17 DIAGNOSIS — Z751 Person awaiting admission to adequate facility elsewhere: Secondary | ICD-10-CM

## 2022-04-17 DIAGNOSIS — D638 Anemia in other chronic diseases classified elsewhere: Secondary | ICD-10-CM | POA: Diagnosis not present

## 2022-04-17 DIAGNOSIS — C7931 Secondary malignant neoplasm of brain: Secondary | ICD-10-CM | POA: Diagnosis not present

## 2022-04-17 DIAGNOSIS — C649 Malignant neoplasm of unspecified kidney, except renal pelvis: Secondary | ICD-10-CM | POA: Diagnosis present

## 2022-04-17 DIAGNOSIS — Z66 Do not resuscitate: Secondary | ICD-10-CM | POA: Diagnosis not present

## 2022-04-17 DIAGNOSIS — S34104A Unspecified injury to L4 level of lumbar spinal cord, initial encounter: Secondary | ICD-10-CM | POA: Diagnosis present

## 2022-04-17 DIAGNOSIS — K5900 Constipation, unspecified: Secondary | ICD-10-CM | POA: Diagnosis not present

## 2022-04-17 DIAGNOSIS — R339 Retention of urine, unspecified: Secondary | ICD-10-CM | POA: Diagnosis not present

## 2022-04-17 DIAGNOSIS — C7801 Secondary malignant neoplasm of right lung: Secondary | ICD-10-CM | POA: Diagnosis present

## 2022-04-17 DIAGNOSIS — Z800 Family history of malignant neoplasm of digestive organs: Secondary | ICD-10-CM

## 2022-04-17 DIAGNOSIS — D497 Neoplasm of unspecified behavior of endocrine glands and other parts of nervous system: Secondary | ICD-10-CM | POA: Diagnosis not present

## 2022-04-17 DIAGNOSIS — Z7952 Long term (current) use of systemic steroids: Secondary | ICD-10-CM

## 2022-04-17 DIAGNOSIS — Z95828 Presence of other vascular implants and grafts: Secondary | ICD-10-CM

## 2022-04-17 DIAGNOSIS — Z8589 Personal history of malignant neoplasm of other organs and systems: Secondary | ICD-10-CM

## 2022-04-17 DIAGNOSIS — C799 Secondary malignant neoplasm of unspecified site: Secondary | ICD-10-CM

## 2022-04-17 LAB — CBC WITH DIFFERENTIAL/PLATELET
Abs Immature Granulocytes: 0.07 10*3/uL (ref 0.00–0.07)
Basophils Absolute: 0.1 10*3/uL (ref 0.0–0.1)
Basophils Relative: 1 %
Eosinophils Absolute: 0 10*3/uL (ref 0.0–0.5)
Eosinophils Relative: 0 %
HCT: 34.3 % — ABNORMAL LOW (ref 39.0–52.0)
Hemoglobin: 11 g/dL — ABNORMAL LOW (ref 13.0–17.0)
Immature Granulocytes: 1 %
Lymphocytes Relative: 5 %
Lymphs Abs: 0.4 10*3/uL — ABNORMAL LOW (ref 0.7–4.0)
MCH: 26.4 pg (ref 26.0–34.0)
MCHC: 32.1 g/dL (ref 30.0–36.0)
MCV: 82.3 fL (ref 80.0–100.0)
Monocytes Absolute: 0.7 10*3/uL (ref 0.1–1.0)
Monocytes Relative: 8 %
Neutro Abs: 7.5 10*3/uL (ref 1.7–7.7)
Neutrophils Relative %: 85 %
Platelets: 257 10*3/uL (ref 150–400)
RBC: 4.17 MIL/uL — ABNORMAL LOW (ref 4.22–5.81)
RDW: 17.6 % — ABNORMAL HIGH (ref 11.5–15.5)
WBC: 8.7 10*3/uL (ref 4.0–10.5)
nRBC: 0 % (ref 0.0–0.2)

## 2022-04-17 LAB — BASIC METABOLIC PANEL
Anion gap: 5 (ref 5–15)
BUN: 17 mg/dL (ref 8–23)
CO2: 25 mmol/L (ref 22–32)
Calcium: 8.2 mg/dL — ABNORMAL LOW (ref 8.9–10.3)
Chloride: 107 mmol/L (ref 98–111)
Creatinine, Ser: 0.55 mg/dL — ABNORMAL LOW (ref 0.61–1.24)
GFR, Estimated: >60 mL/min (ref >60)
Glucose, Bld: 111 mg/dL — ABNORMAL HIGH (ref 70–99)
Potassium: 4.4 mmol/L (ref 3.5–5.1)
Sodium: 137 mmol/L (ref 135–145)

## 2022-04-17 LAB — PROTIME-INR
INR: 1.1 (ref 0.8–1.2)
Prothrombin Time: 14.4 seconds (ref 11.4–15.2)

## 2022-04-17 LAB — MAGNESIUM: Magnesium: 2 mg/dL (ref 1.7–2.4)

## 2022-04-17 LAB — PHOSPHORUS: Phosphorus: 1.9 mg/dL — ABNORMAL LOW (ref 2.5–4.6)

## 2022-04-17 MED ORDER — OXYCODONE HCL 10 MG PO TABS: 10.0000 mg | ORAL_TABLET | ORAL | 0 refills | Status: DC | PRN

## 2022-04-17 MED ORDER — LORAZEPAM 1 MG PO TABS
1.0000 mg | ORAL_TABLET | Freq: Every evening | ORAL | Status: DC | PRN
  Administered 2022-04-17 – 2022-04-18 (×2): 1 mg via ORAL
  Filled 2022-04-17 (×2): qty 1

## 2022-04-17 MED ORDER — SENNOSIDES-DOCUSATE SODIUM 8.6-50 MG PO TABS
1.0000 | ORAL_TABLET | Freq: Every evening | ORAL | Status: DC | PRN
  Administered 2022-04-30: 1 via ORAL
  Filled 2022-04-17: qty 1

## 2022-04-17 MED ORDER — SODIUM CHLORIDE 0.9% FLUSH: 3.0000 mL | INTRAVENOUS | Status: DC | PRN

## 2022-04-17 MED ORDER — DEXAMETHASONE SODIUM PHOSPHATE 10 MG/ML IJ SOLN
10.0000 mg | Freq: Once | INTRAMUSCULAR | Status: AC
  Administered 2022-04-17: 10 mg via INTRAVENOUS
  Filled 2022-04-17: qty 1

## 2022-04-17 MED ORDER — SENNOSIDES-DOCUSATE SODIUM 8.6-50 MG PO TABS
1.0000 | ORAL_TABLET | Freq: Two times a day (BID) | ORAL | Status: DC
  Administered 2022-04-17 – 2022-05-20 (×58): 1 via ORAL
  Filled 2022-04-17 (×65): qty 1

## 2022-04-17 MED ORDER — SODIUM CHLORIDE 0.9% FLUSH
3.0000 mL | Freq: Two times a day (BID) | INTRAVENOUS | Status: DC
  Administered 2022-04-17: 3 mL via INTRAVENOUS

## 2022-04-17 MED ORDER — ACETAMINOPHEN 325 MG PO TABS: 650.0000 mg | ORAL_TABLET | Freq: Four times a day (QID) | ORAL | Status: DC | PRN

## 2022-04-17 MED ORDER — ONDANSETRON HCL 4 MG PO TABS: 4.0000 mg | ORAL_TABLET | Freq: Four times a day (QID) | ORAL | Status: DC | PRN

## 2022-04-17 MED ORDER — BISACODYL 5 MG PO TBEC
5.0000 mg | DELAYED_RELEASE_TABLET | Freq: Every day | ORAL | Status: DC | PRN
  Administered 2022-04-19 – 2022-05-09 (×2): 5 mg via ORAL
  Filled 2022-04-17 (×3): qty 1

## 2022-04-17 MED ORDER — OXYCODONE HCL 5 MG PO TABS
10.0000 mg | ORAL_TABLET | ORAL | Status: DC | PRN
  Administered 2022-04-17 – 2022-04-18 (×3): 10 mg via ORAL
  Filled 2022-04-17 (×3): qty 2

## 2022-04-17 MED ORDER — SODIUM CHLORIDE 0.9 % IV SOLN: 250.0000 mL | INTRAVENOUS | Status: DC | PRN

## 2022-04-17 MED ORDER — LEVALBUTEROL HCL 0.63 MG/3ML IN NEBU: 0.6300 mg | INHALATION_SOLUTION | Freq: Four times a day (QID) | RESPIRATORY_TRACT | Status: DC | PRN

## 2022-04-17 MED ORDER — BIOTIN 2500 MCG PO CAPS: 5000.0000 ug | ORAL_CAPSULE | Freq: Every day | ORAL | Status: DC

## 2022-04-17 MED ORDER — SODIUM CHLORIDE 0.9 % IV SOLN: INTRAVENOUS | Status: DC

## 2022-04-17 MED ORDER — TRAZODONE HCL 50 MG PO TABS
25.0000 mg | ORAL_TABLET | Freq: Every evening | ORAL | Status: DC | PRN
Start: 2022-04-17 — End: 2022-05-05
  Administered 2022-04-17 – 2022-05-03 (×8): 25 mg via ORAL
  Filled 2022-04-17 (×12): qty 1

## 2022-04-17 MED ORDER — SODIUM CHLORIDE 0.9% FLUSH
3.0000 mL | Freq: Two times a day (BID) | INTRAVENOUS | Status: DC
  Administered 2022-04-17 – 2022-05-20 (×62): 3 mL via INTRAVENOUS

## 2022-04-17 MED ORDER — NICOTINE 14 MG/24HR TD PT24
14.0000 mg | MEDICATED_PATCH | Freq: Every day | TRANSDERMAL | Status: DC
  Administered 2022-04-17 – 2022-05-20 (×34): 14 mg via TRANSDERMAL
  Filled 2022-04-17 (×34): qty 1

## 2022-04-17 MED ORDER — VITAMIN D 25 MCG (1000 UNIT) PO TABS
4000.0000 [IU] | ORAL_TABLET | Freq: Every day | ORAL | Status: DC
  Administered 2022-04-18 – 2022-04-21 (×4): 4000 [IU] via ORAL
  Filled 2022-04-17 (×4): qty 4

## 2022-04-17 MED ORDER — FLEET ENEMA 7-19 GM/118ML RE ENEM: 1.0000 | ENEMA | Freq: Once | RECTAL | Status: DC | PRN

## 2022-04-17 MED ORDER — IPRATROPIUM BROMIDE 0.02 % IN SOLN: 0.5000 mg | Freq: Four times a day (QID) | RESPIRATORY_TRACT | Status: DC | PRN

## 2022-04-17 MED ORDER — VITAMIN B-12 1000 MCG PO TABS
1000.0000 ug | ORAL_TABLET | Freq: Every day | ORAL | Status: DC
  Administered 2022-04-18 – 2022-04-21 (×4): 1000 ug via ORAL
  Filled 2022-04-17 (×4): qty 1

## 2022-04-17 MED ORDER — HYDRALAZINE HCL 20 MG/ML IJ SOLN: 10.0000 mg | INTRAMUSCULAR | Status: DC | PRN

## 2022-04-17 MED ORDER — HEPARIN SODIUM (PORCINE) 5000 UNIT/ML IJ SOLN
5000.0000 [IU] | Freq: Three times a day (TID) | INTRAMUSCULAR | Status: DC
  Administered 2022-04-17 – 2022-05-16 (×80): 5000 [IU] via SUBCUTANEOUS
  Filled 2022-04-17 (×85): qty 1

## 2022-04-17 MED ORDER — CEPHALEXIN 500 MG PO CAPS
500.0000 mg | ORAL_CAPSULE | Freq: Four times a day (QID) | ORAL | Status: DC
  Administered 2022-04-17 – 2022-04-21 (×15): 500 mg via ORAL
  Filled 2022-04-17 (×16): qty 1

## 2022-04-17 MED ORDER — HYDROMORPHONE HCL 1 MG/ML IJ SOLN: 0.5000 mg | INTRAMUSCULAR | Status: DC | PRN

## 2022-04-17 MED ORDER — ONDANSETRON HCL 4 MG/2ML IJ SOLN: 4.0000 mg | Freq: Four times a day (QID) | INTRAMUSCULAR | Status: DC | PRN

## 2022-04-17 MED ORDER — ACETAMINOPHEN 650 MG RE SUPP: 650.0000 mg | Freq: Four times a day (QID) | RECTAL | Status: DC | PRN

## 2022-04-17 MED ORDER — DEXAMETHASONE 4 MG PO TABS: 4.0000 mg | ORAL_TABLET | Freq: Every day | ORAL | Status: DC

## 2022-04-17 MED ORDER — ASCORBIC ACID 500 MG PO TABS
500.0000 mg | ORAL_TABLET | Freq: Every day | ORAL | Status: DC
  Administered 2022-04-17 – 2022-04-20 (×4): 500 mg via ORAL
  Filled 2022-04-17 (×5): qty 1

## 2022-04-17 MED ORDER — GABAPENTIN 100 MG PO CAPS
100.0000 mg | ORAL_CAPSULE | Freq: Three times a day (TID) | ORAL | Status: DC
  Administered 2022-04-17 – 2022-05-20 (×99): 100 mg via ORAL
  Filled 2022-04-17 (×99): qty 1

## 2022-04-17 MED ORDER — DEXAMETHASONE SODIUM PHOSPHATE 4 MG/ML IJ SOLN
4.0000 mg | Freq: Four times a day (QID) | INTRAMUSCULAR | Status: AC
  Administered 2022-04-17 – 2022-04-18 (×4): 4 mg via INTRAVENOUS
  Filled 2022-04-17 (×4): qty 1

## 2022-04-17 MED ORDER — DEXAMETHASONE 4 MG PO TABS
4.0000 mg | ORAL_TABLET | Freq: Two times a day (BID) | ORAL | Status: DC
Start: 2022-04-19 — End: 2022-05-07
  Administered 2022-04-19 – 2022-05-07 (×37): 4 mg via ORAL
  Filled 2022-04-17 (×37): qty 1

## 2022-04-17 MED ORDER — OXYCODONE HCL 5 MG PO TABS: 5.0000 mg | ORAL_TABLET | ORAL | Status: DC | PRN

## 2022-04-17 NOTE — Telephone Encounter (Signed)
Received call from pt's girlfriend, [name], stating that Gregory [name]'s left lower extremity weakness is worse this morning, to the point where he cannot stand up at all or move. Given his hx, advised to report to the ER. They verbalize understanding and are agreeable to come to [facility] ER. They are currently awaiting assistance from his daughter and son-in-law to help transfer him to the car. Report called to [facility], ED Charge RN.

## 2022-04-17 NOTE — Assessment & Plan Note (Signed)
-   Discussed with Dr. Delton Coombes, will continue Decadron, -Patient is to see Dr. Tammi Klippel for radiation therapy -Continue as needed analgesics -PT

## 2022-04-17 NOTE — Hospital Course (Addendum)
66 year old man PMH metastatic renal cell cancer with widespread bony metastasis to the spine, currently undergoing radiation therapy for known epidural tumor, undergoing chemotherapy, presented with worsening left leg weakness to any pain.  After discussion with his oncologist,transferred to [facility] for radiation therapy.Palliative care consulted, primary oncologist basically said no further treatment so palliative is looking into him getting into residential hospice, he is not at the end of his life but mainly for symptom control. New urinary retention 9/5, possibly due to his progressive metastatic disease started on Flomax, may need a catheter if persists.  At this time patient remains medically stable for discharge to facility with DNR-TOC consult for residential hospice at [facility].

## 2022-04-17 NOTE — ED Triage Notes (Signed)
Pt to ED from home c/o increased weakness and pain in left leg. Pt has metastatic bone cancer to entire spine, left shoulder and pelvis per pt report. Unable to bear weight on left leg.

## 2022-04-17 NOTE — H&P (Signed)
History and Physical   Patient: Gregory [name].                            PCP: Pcp, No                    DOB: 1956-04-02            DOA: 04/17/2022 NGE:[id number]             DOS: 04/17/2022, 2:44 PM  Pcp, No  Patient coming from:   HOME  I have personally reviewed patient's medical records, in electronic medical records, including:  [name], and care everywhere.    Chief Complaint:   Chief Complaint  Patient presents with   Leg Pain    History of present illness:    No notes on file    Patient Denies having: Fever, Chills, Cough, SOB, Chest Pain, Abd pain, N/V/D, headache, dizziness, lightheadedness,  Dysuria, Joint pain, rash, open wounds  ED Course:   Blood pressure 102/68, pulse 80, temperature 99.1 F (37.3 C), resp. rate 18, height '5\' 7"'$  (1.702 m), weight 52 kg, SpO2 99 %. Abnormal labs;   Review of Systems: As per HPI, otherwise 10 point review of systems were negative.   ----------------------------------------------------------------------------------------------------------------------  No Known Allergies  Home MEDs:  Prior to Admission medications   Medication Sig Start Date End Date Taking? Authorizing Provider  Ascorbic Acid (VITAMIN C PO) Take 2 tablets by mouth daily.   Yes [provider]  Biotin 2500 MCG CAPS Take 5,000 mcg by mouth daily.   Yes [provider]  cephALEXin (KEFLEX) 500 MG capsule Take 1 capsule (500 mg total) by mouth 4 (four) times daily. 04/13/22  Yes Kyung Rudd, MD  Cholecalciferol (VITAMIN D) 50 MCG (2000 UT) tablet Take 4,000 Units by mouth daily.   Yes [provider]  Cyanocobalamin (B-12) 3000 MCG CAPS Take 6,000 mcg by mouth daily.   Yes [provider]  dexamethasone (DECADRON) 4 MG tablet Take 1 tablet (4 mg total) by mouth daily. 02/26/22  Yes Derek Jack, MD  ELDERBERRY PO Take 2 capsules by mouth daily.   Yes [provider]  gabapentin (NEURONTIN)  100 MG capsule Take 1 capsule (100 mg total) by mouth 3 (three) times daily. 02/26/22  Yes Derek Jack, MD  lidocaine-prilocaine (EMLA) cream Apply a small amount to port a cath site and cover with plastic wrap 1 hour prior to infusion appointments 02/04/22  Yes Derek Jack, MD  LORazepam (ATIVAN) 1 MG tablet Take 1 tablet (1 mg total) by mouth at bedtime as needed for anxiety. 03/13/22  Yes Derek Jack, MD  Oxycodone HCl 10 MG TABS Take 1 tablet (10 mg total) by mouth every 4 (four) hours as needed. 04/17/22  Yes Derek Jack, MD  senna-docusate (SENOKOT-S) 8.6-50 MG tablet Take 1 tablet by mouth 2 (two) times daily.   Yes [provider]  Ipilimumab (YERVOY IV) Inject into the vein every 21 ( twenty-one) days. X 4 cycles 02/08/22   [provider]  Nivolumab (OPDIVO IV) Inject into the vein every 21 ( twenty-one) days. 02/08/22   [provider]    PRN MEDs: sodium chloride, acetaminophen **OR** acetaminophen, bisacodyl, hydrALAZINE, HYDROmorphone (DILAUDID) injection, ipratropium, levalbuterol, LORazepam, ondansetron **OR** ondansetron (ZOFRAN) IV, oxyCODONE, oxyCODONE, senna-docusate, sodium chloride flush, sodium phosphate, traZODone  Past Medical History:  Diagnosis Date   Port-A-Cath in place 02/04/2022  Renal cell carcinoma ([place]) 10/23/2015   Skull lesion 10/20/2015   metastatic renal cell carcinoma    Past Surgical History:  Procedure Laterality Date   PORTACATH PLACEMENT Right 02/23/2022   Procedure: INSERTION PORT-A-CATH;  Surgeon: Aviva Signs, MD;  Location: [facility];  Service: General;  Laterality: Right;   robotic radical right nephrectomy       reports that he has been smoking cigarettes. He has a 22.50 pack-year smoking history. He has never used smokeless tobacco. He reports that he does not currently use alcohol after a past usage of about 20.0 standard drinks of alcohol per week. He reports current drug use. Frequency:  1.00 time per week.   Family History  Problem Relation Age of Onset   CAD Mother    CAD Father    Cancer Paternal Uncle        stomach cancer   Cancer Paternal Uncle        unknown    Physical Exam:   Vitals:   04/17/22 1118 04/17/22 1121 04/17/22 1200 04/17/22 1330  BP: 110/70  99/66 102/68  Pulse: (!) 101  83 80  Resp:  '18 16 18  '$ Temp:  99.1 F (37.3 C)    SpO2: 93%  97% 99%  Weight:      Height:       Constitutional: NAD, calm, comfortable Eyes: PERRL, lids and conjunctivae normal ENMT: Mucous membranes are moist. Posterior pharynx clear of any exudate or lesions.Normal dentition.  Neck: normal, supple, no masses, no thyromegaly Respiratory: clear to auscultation bilaterally, no wheezing, no crackles. Normal respiratory effort. No accessory muscle use.  Cardiovascular: Regular rate and rhythm, no murmurs / rubs / gallops. No extremity edema. 2+ pedal pulses. No carotid bruits.  Abdomen: no tenderness, no masses palpated. No hepatosplenomegaly. Bowel sounds positive.  Musculoskeletal: left leg pain, no clubbing / cyanosis. No joint deformity upper and lower extremities. Good ROM, no contractures. Normal muscle tone.  Neurologic: CN II-XII grossly intact. Sensation intact, DTR normal. Strength 5/5 in all 4.  Psychiatric: Normal judgment and insight. Alert and oriented x 3. Normal mood.  Skin: no rashes, lesions, ulcers. No induration Decubitus/ulcers:  Wounds: per nursing documentation      Labs on admission:    I have personally reviewed following labs and imaging studies  CBC: Recent Labs  Lab 04/12/22 0922 04/17/22 1248  WBC 12.1* 8.7  NEUTROABS 10.1* 7.5  HGB 12.1* 11.0*  HCT 37.4* 34.3*  MCV 81.0 82.3  PLT 245 409   Basic Metabolic Panel: Recent Labs  Lab 04/12/22 0922 04/17/22 1248  NA 133* 137  K 4.1 4.4  CL 103 107  CO2 23 25  GLUCOSE 119* 111*  BUN 18 17  CREATININE 0.56* 0.55*  CALCIUM 8.2* 8.2*  MG 1.9 2.0  PHOS  --  1.9*    GFR: Estimated Creatinine Clearance: 65.9 mL/min (A) (by C-G formula based on SCr of 0.55 mg/dL (L)). Liver Function Tests: Recent Labs  Lab 04/12/22 0922  AST 21  ALT 15  ALKPHOS 96  BILITOT 0.5  PROT 6.5  ALBUMIN 2.9*    Last A1C:  No results found for: "HGBA1C"   Radiologic Exams on Admission:   No results found.  EKG:   Independently reviewed.  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 Delton Coombes.. Was agreed for the patient to be admitted to [facility], for continuous radiation therapy -and further evaluation and imaging -Attempting to reach his primary radiation oncologist Dr. Tammi Klippel (Per patient needs sedation for MRI or transferred to a facility with open MRI) -Continue with p.o. 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 Delton Coombes, will continue Decadron, -Patient is to see Dr. 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 Delton Coombes and radiation oncologist Dr. Tammi Klippel  -Previous brain, cervical, lumbar, shoulder and thoracic MRI from 5/14, 6/7, 7/12, 7/29 04/06/2022 were all reviewed Extensive bony metastatic disease was noted throughout the spinal cord  MRI 04/06/2022 IMPRESSION: Cervical spine. 1. As seen on recent thoracic MRI, there is extensive osseous metastatic disease throughout the cervical spine. At C6, there is a pathologic fracture with osseous retropulsion, epidural and paraspinal tumor eccentric to the left causing cord compression. 2. No abnormal cord signal or enhancement identified. 3. Right cerebellar osseous metastases, similar to previous brain MRI.        Consults called: Radiation oncologist/oncologist Dr. Delton Coombes  (Case was discussed with Dr. Delton Coombes who recommended patient to be admitted to [facility] for ongoing radiation oncology evaluation and possible imaging) -------------------------------------------------------------------------------------------------------------------------------------------- DVT prophylaxis:  heparin injection 5,000 Units Start: 04/17/22 1400 TED hose Start: 04/17/22 1336 SCDs Start: 04/17/22 1336   Code Status:   Code Status: Full Code   Admission status: Patient will be admitted as Observation, with a greater than 2 midnight length of stay. Level of care: Med-Surg   Family Communication:  none at bedside  (The above findings and plan of care has been discussed with patient in detail, the patient expressed understanding and agreement  of above plan)  --------------------------------------------------------------------------------------------------------------------------------------------------  Disposition Plan:  Anticipated 1-2 days Status is: Observation The patient remains OBS appropriate and will d/c before 2 midnights.  Needing inpatient evaluation by radiation oncology.  Primary oncologist recommended further imaging and IV steroids     ----------------------------------------------------------------------------------------------------------------------------------------------------  Time spent: > than  33  Min.   SIGNED: [name], MD, FHM. [facility],  Pager (Please use [email or web address] to page to text)  If 7PM-7AM, please contact night-coverage [email or web address],  04/17/2022, 2:44 PM

## 2022-04-17 NOTE — Assessment & Plan Note (Signed)
-   Chronic tobacco abuse -Patient was counseled, NicoDerm patch has been provided

## 2022-04-17 NOTE — ED Provider Notes (Signed)
[facility] EMERGENCY DEPARTMENT Provider Note   CSN: [id number] Arrival date & time: 04/17/22  1105     History  Chief Complaint  Patient presents with   Leg Pain    Gregory [name]. is a 66 y.o. male.  HPI Patient presenting for left leg weakness that occurred when he went to stand up from bed this morning.  He slept well last night, better than usual.  He is on chronic narcotic treatment for bone pain from metastatic cancer.  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  He is receiving chemotherapy.      Patient last seen by his oncologist on 04/12/2022 at that time he plan to continue chemotherapy.  He plans on evaluating metastatic renal cancer, further with CT of the chest abdomen pelvis and bone scan before next office visit.  He has pre-existing epidural tumors of the lower thoracic spine and lumbar spines.  The latter has been compressing the L4 nerve root.  He has been receiving palliative radiation to the spinal abnormalities.  He is an ongoing smoker.  Is currently being treated with 4 mg Decadron daily.  Home Medications Prior to Admission medications   Medication Sig Start Date End Date Taking? Authorizing Provider  Ascorbic Acid (VITAMIN C PO) Take 2 tablets by mouth daily.   Yes [provider]  Biotin 2500 MCG CAPS Take 5,000 mcg by mouth daily.   Yes [provider]  cephALEXin (KEFLEX) 500 MG capsule Take 1 capsule (500 mg total) by mouth 4 (four) times daily. 04/13/22  Yes Kyung Rudd, MD  Cholecalciferol (VITAMIN D) 50 MCG (2000 UT) tablet Take 4,000 Units by mouth daily.   Yes [provider]  Cyanocobalamin (B-12) 3000 MCG CAPS Take 6,000 mcg by mouth daily.   Yes [provider]  dexamethasone (DECADRON) 4 MG tablet Take 1 tablet (4 mg total) by mouth daily. 02/26/22  Yes Derek Jack, MD  ELDERBERRY PO Take 2 capsules by mouth daily.   Yes [provider]  gabapentin (NEURONTIN)  100 MG capsule Take 1 capsule (100 mg total) by mouth 3 (three) times daily. 02/26/22  Yes Derek Jack, MD  lidocaine-prilocaine (EMLA) cream Apply a small amount to port a cath site and cover with plastic wrap 1 hour prior to infusion appointments 02/04/22  Yes Derek Jack, MD  LORazepam (ATIVAN) 1 MG tablet Take 1 tablet (1 mg total) by mouth at bedtime as needed for anxiety. 03/13/22  Yes Derek Jack, MD  Oxycodone HCl 10 MG TABS Take 1 tablet (10 mg total) by mouth every 4 (four) hours as needed. 04/17/22  Yes Derek Jack, MD  senna-docusate (SENOKOT-S) 8.6-50 MG tablet Take 1 tablet by mouth 2 (two) times daily.   Yes [provider]  Ipilimumab (YERVOY IV) Inject into the vein every 21 ( twenty-one) days. X 4 cycles 02/08/22   [provider]  Nivolumab (OPDIVO IV) Inject into the vein every 21 ( twenty-one) days. 02/08/22   [provider]      Allergies    Patient has no known allergies.    Review of Systems   Review of Systems  Physical Exam Updated Vital Signs BP 102/68   Pulse 80   Temp 99.1 F (37.3 C)   Resp 18   Ht '5\' 7"'$  (1.702 m)   Wt 52 kg   SpO2 99%   BMI 17.96 kg/m  Physical Exam Vitals and nursing note reviewed.  Constitutional:  General: He is not in acute distress.    Appearance: He is well-developed. He is not ill-appearing, toxic-appearing or diaphoretic.  HENT:     Head: Normocephalic and atraumatic.     Right Ear: External ear normal.     Left Ear: External ear normal.  Eyes:     Conjunctiva/sclera: Conjunctivae normal.     Pupils: Pupils are equal, round, and reactive to light.  Neck:     Trachea: Phonation normal.  Cardiovascular:     Rate and Rhythm: Normal rate and regular rhythm.     Heart sounds: Normal heart sounds.  Pulmonary:     Effort: Pulmonary effort is normal.     Breath sounds: Normal breath sounds.  Abdominal:     General: There is no distension.     Palpations: Abdomen  is soft.     Tenderness: There is no abdominal tenderness.  Musculoskeletal:        General: Normal range of motion.     Cervical back: Normal range of motion and neck supple.  Skin:    General: Skin is warm and dry.  Neurological:     Mental Status: He is alert and oriented to person, place, and time.     Cranial Nerves: No cranial nerve deficit.     Motor: No abnormal muscle tone.     Coordination: Coordination normal.     Comments: Weakness left knee extension consistent with quadriceps muscle deficit.  Intact dorsiflexion and plantarflexion of the feet bilaterally.  Psychiatric:        Mood and Affect: Mood normal.        Behavior: Behavior normal.        Thought Content: Thought content normal.        Judgment: Judgment normal.     ED Results / Procedures / Treatments   Labs (all labs ordered are listed, but only abnormal results are displayed) Labs Reviewed  BASIC METABOLIC PANEL - Abnormal; Notable for the following components:      Result Value   Glucose, Bld 111 (*)    Creatinine, Ser 0.55 (*)    Calcium 8.2 (*)    All other components within normal limits  CBC WITH DIFFERENTIAL/PLATELET - Abnormal; Notable for the following components:   RBC 4.17 (*)    Hemoglobin 11.0 (*)    HCT 34.3 (*)    RDW 17.6 (*)    Lymphs Abs 0.4 (*)    All other components within normal limits  HIV ANTIBODY (ROUTINE TESTING W REFLEX)  MAGNESIUM  PHOSPHORUS  PROTIME-INR    EKG None  Radiology No results found.  Procedures Procedures    Medications Ordered in ED Medications  0.9 %  sodium chloride infusion ( Intravenous New Bag/Given 04/17/22 1236)  heparin injection 5,000 Units (has no administration in time range)  sodium chloride flush (NS) 0.9 % injection 3 mL (has no administration in time range)  0.9 %  sodium chloride infusion ( Intravenous Rate/Dose Verify 04/17/22 1358)  sodium chloride flush (NS) 0.9 % injection 3 mL (3 mLs Intravenous Given 04/17/22 1358)  sodium  chloride flush (NS) 0.9 % injection 3 mL (has no administration in time range)  0.9 %  sodium chloride infusion (has no administration in time range)  acetaminophen (TYLENOL) tablet 650 mg (has no administration in time range)    Or  acetaminophen (TYLENOL) suppository 650 mg (has no administration in time range)  oxyCODONE (Oxy IR/ROXICODONE) immediate release tablet 5 mg (has no administration in  time range)  HYDROmorphone (DILAUDID) injection 0.5-1 mg (has no administration in time range)  traZODone (DESYREL) tablet 25 mg (has no administration in time range)  senna-docusate (Senokot-S) tablet 1 tablet (has no administration in time range)  bisacodyl (DULCOLAX) EC tablet 5 mg (has no administration in time range)  sodium phosphate (FLEET) 7-19 GM/118ML enema 1 enema (has no administration in time range)  ondansetron (ZOFRAN) tablet 4 mg (has no administration in time range)    Or  ondansetron (ZOFRAN) injection 4 mg (has no administration in time range)  ipratropium (ATROVENT) nebulizer solution 0.5 mg (has no administration in time range)  levalbuterol (XOPENEX) nebulizer solution 0.63 mg (has no administration in time range)  hydrALAZINE (APRESOLINE) injection 10 mg (has no administration in time range)  oxyCODONE (Oxy IR/ROXICODONE) immediate release tablet 10 mg (has no administration in time range)  cephALEXin (KEFLEX) capsule 500 mg (has no administration in time range)  LORazepam (ATIVAN) tablet 1 mg (has no administration in time range)  dexamethasone (DECADRON) tablet 4 mg (has no administration in time range)  senna-docusate (Senokot-S) tablet 1 tablet (has no administration in time range)  B-12 CAPS 6,000 mcg (has no administration in time range)  gabapentin (NEURONTIN) capsule 100 mg (has no administration in time range)  ascorbic acid (VITAMIN C) tablet 500 mg (has no administration in time range)  cholecalciferol (VITAMIN D3) 25 MCG (1000 UNIT) tablet 4,000 Units (has no  administration in time range)  dexamethasone (DECADRON) injection 10 mg (10 mg Intravenous Given 04/17/22 1234)    ED Course/ Medical Decision Making/ A&P                           Medical Decision Making Patient presenting with weakness of the left leg, acute on chronic, suddenly occurred this morning.  He has known L4 nerve root compression which is likely progress.  We will give a dose of Decadron, and consult his oncologist.  He may need to be hospitalized for further management.  He has not seen neurosurgery recently however this does not appear to be a problem that can be managed operatively.  Treatment started in the ED with Decadron.  Oncology consulted who recommends repeat MRI imaging.  Patient refuses MRI imaging without general anesthesia or open MRI.  Problems Addressed: Lumbar nerve root compression: acute illness or injury with systemic symptoms    Details: Worsening L4 left nerve root compression causing left leg weakness and inability to walk. Metastatic malignant neoplasm, unspecified site [facility]):    Details: Progressive symptoms with lumbar radiculopathy  Amount and/or Complexity of Data Reviewed Independent Historian:     Details: He is a cogent historian External Data Reviewed: notes.    Details: Patient last seen by his oncologist on 04/12/2022 at that time he plan to continue chemotherapy.  He plans on evaluating metastatic renal cancer, further with CT of the chest abdomen pelvis and bone scan before next office visit.  He has pre-existing epidural tumors of the lower thoracic spine and lumbar spines.  The latter has been compressing the L4 nerve root.  He has been receiving palliative radiation to the spinal abnormalities.  He is an ongoing smoker.  Is currently being treated with 4 mg Decadron daily. Labs: ordered.    Details: CBC, metabolic panel -- normal except hemoglobin low Radiology:     Details: Ordered MRI imaging of the cervical, thoracic and lumbar spines.   Patient declined. Discussion of management or test  interpretation with external provider(s): Case discussed with oncologist, Dr. Delton Coombes who agrees with initiation of steroid treatment, and suggest repeat imaging with MRI of the cervical, thoracic and lumbar spines to evaluate the pre-existing cervical, thoracic and lumbar paraspinal tumors.   Hospitalist contacted will admit, anticipate transfer to [facility] long hospital.  Risk Prescription drug management. Decision regarding hospitalization. Risk Details: Patient is unable to walk.  He likely has progression of L4 nerve root, left, due to cancer.  He has refused MRI imaging to assess this.  He will require hospitalization because he cannot walk, and needs to have ongoing management with daily radiation therapy.  Parenteral steroids initiated.  Hospitalist consulted to admit with transfer to [facility] long hospital.           Final Clinical Impression(s) / ED Diagnoses Final diagnoses:  Left leg weakness  Lumbar nerve root compression  Metastatic malignant neoplasm, unspecified site [facility])    Rx / DC Orders ED Discharge Orders     None         Daleen Bo, MD 04/17/22 1359

## 2022-04-17 NOTE — Telephone Encounter (Signed)
Patient called stating he is experiencing loss of balance, wanted to cancel [facility] 8/15. L1 notified.

## 2022-04-17 NOTE — ED Notes (Signed)
Pt refusing MRI due to being claustrophobic. EDP informed.

## 2022-04-17 NOTE — Assessment & Plan Note (Addendum)
Acute on chronic left leg weakness, due to metastatic disease  - worsening left leg weakness this morning -Unable to balance or ambulate -With pain at lower back approximately L4 area -Due to severe claustrophobia, unable to obtain imaging including MRI which he needs -ED provider has discussed the case with Dr. Delton Coombes.. Was agreed for the patient to be admitted to [facility], for continuous radiation therapy -and further evaluation and imaging -Attempting to reach his primary radiation oncologist Dr. Tammi Klippel (Per patient needs sedation for MRI or transferred to a facility with open MRI) -Continue with p.o. and IV as needed analgesics -PT/OT

## 2022-04-17 NOTE — Assessment & Plan Note (Signed)
Under radiation therapy with Dr. Tammi Klippel

## 2022-04-17 NOTE — Assessment & Plan Note (Addendum)
-   Under radiation and chemotherapy -Last treatment 04/16/2022 -Primary oncologist Dr. Delton Coombes and radiation oncologist Dr. Tammi Klippel  -Previous brain, cervical, lumbar, shoulder and thoracic MRI from 5/14, 6/7, 7/12, 7/29 04/06/2022 were all reviewed Extensive bony metastatic disease was noted throughout the spinal cord  MRI 04/06/2022 IMPRESSION: Cervical spine. 1. As seen on recent thoracic MRI, there is extensive osseous metastatic disease throughout the cervical spine. At C6, there is a pathologic fracture with osseous retropulsion, epidural and paraspinal tumor eccentric to the left causing cord compression. 2. No abnormal cord signal or enhancement identified. 3. Right cerebellar osseous metastases, similar to previous brain MRI.

## 2022-04-17 NOTE — Plan of Care (Signed)

## 2022-04-17 NOTE — Assessment & Plan Note (Signed)
-  Functional -May be used as needed

## 2022-04-17 NOTE — Assessment & Plan Note (Signed)
On the radiation and chemotherapy

## 2022-04-18 ENCOUNTER — Ambulatory Visit
Admission: RE | Admit: 2022-04-18 | Discharge: 2022-04-18 | Disposition: A | Discharge status: Home / Self Care | Payer: PPO | Source: Ambulatory Visit | Attending: Radiation Oncology | Admitting: Radiation Oncology

## 2022-04-18 ENCOUNTER — Ambulatory Visit: Admission: RE | Admit: 2022-04-18 | Payer: PPO | Source: Ambulatory Visit

## 2022-04-18 DIAGNOSIS — C7949 Secondary malignant neoplasm of other parts of nervous system: Secondary | ICD-10-CM | POA: Diagnosis present

## 2022-04-18 DIAGNOSIS — R29898 Other symptoms and signs involving the musculoskeletal system: Secondary | ICD-10-CM

## 2022-04-18 DIAGNOSIS — D638 Anemia in other chronic diseases classified elsewhere: Secondary | ICD-10-CM | POA: Diagnosis present

## 2022-04-18 DIAGNOSIS — Z7189 Other specified counseling: Secondary | ICD-10-CM | POA: Diagnosis not present

## 2022-04-18 DIAGNOSIS — K5900 Constipation, unspecified: Secondary | ICD-10-CM | POA: Diagnosis not present

## 2022-04-18 DIAGNOSIS — C7951 Secondary malignant neoplasm of bone: Secondary | ICD-10-CM

## 2022-04-18 DIAGNOSIS — F17210 Nicotine dependence, cigarettes, uncomplicated: Secondary | ICD-10-CM | POA: Diagnosis present

## 2022-04-18 DIAGNOSIS — Z79899 Other long term (current) drug therapy: Secondary | ICD-10-CM | POA: Diagnosis not present

## 2022-04-18 DIAGNOSIS — G9529 Other cord compression: Secondary | ICD-10-CM | POA: Diagnosis present

## 2022-04-18 DIAGNOSIS — R54 Age-related physical debility: Secondary | ICD-10-CM | POA: Diagnosis present

## 2022-04-18 DIAGNOSIS — R339 Retention of urine, unspecified: Secondary | ICD-10-CM | POA: Diagnosis not present

## 2022-04-18 DIAGNOSIS — C649 Malignant neoplasm of unspecified kidney, except renal pelvis: Secondary | ICD-10-CM | POA: Diagnosis not present

## 2022-04-18 DIAGNOSIS — Z66 Do not resuscitate: Secondary | ICD-10-CM | POA: Diagnosis present

## 2022-04-18 DIAGNOSIS — Z515 Encounter for palliative care: Secondary | ICD-10-CM | POA: Diagnosis not present

## 2022-04-18 DIAGNOSIS — R636 Underweight: Secondary | ICD-10-CM | POA: Diagnosis present

## 2022-04-18 DIAGNOSIS — Z993 Dependence on wheelchair: Secondary | ICD-10-CM | POA: Diagnosis not present

## 2022-04-18 DIAGNOSIS — D631 Anemia in chronic kidney disease: Secondary | ICD-10-CM | POA: Diagnosis present

## 2022-04-18 DIAGNOSIS — C787 Secondary malignant neoplasm of liver and intrahepatic bile duct: Secondary | ICD-10-CM | POA: Diagnosis present

## 2022-04-18 DIAGNOSIS — M8458XA Pathological fracture in neoplastic disease, other specified site, initial encounter for fracture: Secondary | ICD-10-CM | POA: Diagnosis present

## 2022-04-18 DIAGNOSIS — C7802 Secondary malignant neoplasm of left lung: Secondary | ICD-10-CM | POA: Diagnosis present

## 2022-04-18 DIAGNOSIS — M5416 Radiculopathy, lumbar region: Secondary | ICD-10-CM | POA: Diagnosis present

## 2022-04-18 DIAGNOSIS — D497 Neoplasm of unspecified behavior of endocrine glands and other parts of nervous system: Secondary | ICD-10-CM | POA: Diagnosis not present

## 2022-04-18 DIAGNOSIS — C7931 Secondary malignant neoplasm of brain: Secondary | ICD-10-CM | POA: Diagnosis present

## 2022-04-18 DIAGNOSIS — F40240 Claustrophobia: Secondary | ICD-10-CM | POA: Diagnosis present

## 2022-04-18 DIAGNOSIS — R64 Cachexia: Secondary | ICD-10-CM | POA: Diagnosis present

## 2022-04-18 DIAGNOSIS — Z681 Body mass index (BMI) 19 or less, adult: Secondary | ICD-10-CM | POA: Diagnosis not present

## 2022-04-18 DIAGNOSIS — C7801 Secondary malignant neoplasm of right lung: Secondary | ICD-10-CM | POA: Diagnosis present

## 2022-04-18 DIAGNOSIS — N182 Chronic kidney disease, stage 2 (mild): Secondary | ICD-10-CM | POA: Diagnosis present

## 2022-04-18 LAB — CBC
HCT: 33.5 % — ABNORMAL LOW (ref 39.0–52.0)
Hemoglobin: 10.8 g/dL — ABNORMAL LOW (ref 13.0–17.0)
MCH: 26.2 pg (ref 26.0–34.0)
MCHC: 32.2 g/dL (ref 30.0–36.0)
MCV: 81.1 fL (ref 80.0–100.0)
Platelets: 270 10*3/uL (ref 150–400)
RBC: 4.13 MIL/uL — ABNORMAL LOW (ref 4.22–5.81)
RDW: 17.2 % — ABNORMAL HIGH (ref 11.5–15.5)
WBC: 8.2 10*3/uL (ref 4.0–10.5)
nRBC: 0 % (ref 0.0–0.2)

## 2022-04-18 LAB — PROTIME-INR
INR: 1.1 (ref 0.8–1.2)
Prothrombin Time: 14 seconds (ref 11.4–15.2)

## 2022-04-18 LAB — BASIC METABOLIC PANEL
Anion gap: 7 (ref 5–15)
BUN: 21 mg/dL (ref 8–23)
CO2: 21 mmol/L — ABNORMAL LOW (ref 22–32)
Calcium: 8.2 mg/dL — ABNORMAL LOW (ref 8.9–10.3)
Chloride: 107 mmol/L (ref 98–111)
Creatinine, Ser: 0.66 mg/dL (ref 0.61–1.24)
GFR, Estimated: >60 mL/min (ref >60)
Glucose, Bld: 121 mg/dL — ABNORMAL HIGH (ref 70–99)
Potassium: 4.1 mmol/L (ref 3.5–5.1)
Sodium: 135 mmol/L (ref 135–145)

## 2022-04-18 LAB — GLUCOSE, CAPILLARY: Glucose-Capillary: 128 mg/dL — ABNORMAL HIGH (ref 70–99)

## 2022-04-18 LAB — HIV ANTIBODY (ROUTINE TESTING W REFLEX): HIV Screen 4th Generation wRfx: REACTIVE — AB

## 2022-04-18 LAB — APTT: aPTT: 27 seconds (ref 24–36)

## 2022-04-18 MED ORDER — OXYCODONE HCL 5 MG PO TABS
10.0000 mg | ORAL_TABLET | ORAL | Status: DC | PRN
  Administered 2022-04-19 – 2022-05-18 (×118): 10 mg via ORAL
  Filled 2022-04-18 (×120): qty 2

## 2022-04-18 MED ORDER — DEXTROSE 5 % IV SOLN
30.0000 mmol | Freq: Once | INTRAVENOUS | Status: AC
  Administered 2022-04-18: 30 mmol via INTRAVENOUS
  Filled 2022-04-18: qty 10

## 2022-04-18 MED ORDER — HYDROMORPHONE HCL 1 MG/ML IJ SOLN
0.5000 mg | INTRAMUSCULAR | Status: DC | PRN
  Filled 2022-04-18 (×2): qty 1

## 2022-04-18 MED ORDER — CHLORHEXIDINE GLUCONATE CLOTH 2 % EX PADS
6.0000 | MEDICATED_PAD | Freq: Every day | CUTANEOUS | Status: DC
  Administered 2022-04-18 – 2022-05-03 (×15): 6 via TOPICAL

## 2022-04-18 NOTE — Evaluation (Signed)
Occupational Therapy Evaluation Patient Details Name: Gregory [name]. MRN: [id number] DOB: 07/24/1956 Today's Date: 04/18/2022   History of Present Illness 66 y.o. male presenting for left leg weakness that occurred when he went to stand up from bed.  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   Clinical Impression   Gregory [name] is a 66 year old man who presents with above medical history. At baseline he has had significant difficulties with mobility, ambulation and ADLs for the last three weeks due to progressively worsening weakness of LLE. 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 ON evaluation he required assistance for supine to sit, standing and pivot transfer. He requires assistance for all ADLs and activity very limited to chair and bed. Patient will benefit from skilled OT services while in hospital to improve deficits and learn compensatory strategies as needed in order to improve patient's functional abilities and reduce caregiver burden.         Recommendations for follow up therapy are one component of a multi-disciplinary discharge planning process, led by the attending physician.  Recommendations may be updated based on patient status, additional functional criteria and insurance authorization.   Follow Up Recommendations  Skilled nursing-short term rehab (<3 hours/day)    Assistance Recommended at Discharge Frequent or constant Supervision/Assistance  Patient can return home with the following A lot of help with walking and/or transfers;A lot of help with bathing/dressing/bathroom;Assistance with cooking/housework;Help with stairs or ramp for entrance;Assist for transportation    Functional Status Assessment  Patient has had a recent decline in their functional status and/or  demonstrates limited ability to make significant improvements in function in a reasonable and predictable amount of time  Equipment Recommendations  Tub/shower bench    Recommendations for Other Services       Precautions / Restrictions Precautions Precautions: Fall Precaution Comments: 2 falls per month, LUE weak with shoulder tumor, LLE weakness Restrictions Weight Bearing Restrictions: No      Mobility Bed Mobility                    Transfers                          Balance Overall balance assessment: Needs assistance Sitting-balance support: Feet supported Sitting balance-Leahy Scale: Poor Sitting balance - Comments: mostly okay at edge of bed but cannot resist a challenge   Standing balance support: Bilateral upper extremity supported, Reliant on assistive device for balance Standing balance-Leahy Scale: Poor                             ADL either performed or assessed with clinical judgement   ADL Overall ADL's : Needs assistance/impaired Eating/Feeding: Set up;Bed level   Grooming: Set up;Bed level;Wash/dry face;Wash/dry hands   Upper Body Bathing: Moderate assistance;Bed level   Lower Body Bathing: Total assistance;Bed level   Upper Body Dressing : Maximal assistance;Bed level   Lower Body Dressing: Total assistance;Bed level   Toilet Transfer: Moderate assistance;BSC/3in1;+2 for safety/equipment   Toileting- Clothing Manipulation and Hygiene: Total assistance;Sit to/from stand       Functional mobility during ADLs: Moderate assistance General ADL Comments: Mod assist to power up - patient heavily reliant on  pulling himself up using RUE over therapist shoulders. Once in standing can take a couple of steps with +2 hand holds     Vision Patient Visual Report: No change from baseline       Perception     Praxis      Pertinent Vitals/Pain Pain Assessment Pain Descriptors / Indicators: Sore     Hand Dominance  Right   Extremity/Trunk Assessment Upper Extremity Assessment Upper Extremity Assessment: RUE deficits/detail;LUE deficits/detail RUE Deficits / Details: WFL ROM, grossly 4-/5 strength in shoulder and elbow, wrist 5/5, grip 3+/5 RUE Sensation: WNL RUE Coordination: WNL LUE Deficits / Details: 2+/5 shoulder, elbow 3/5, wrist 3/5, grip 3/5 LUE Sensation:  (reports numbness in thumn) LUE Coordination: WNL   Lower Extremity Assessment Lower Extremity Assessment: Defer to PT evaluation RLE Deficits / Details: right foot drop RLE Sensation: WNL LLE Deficits / Details: left unable 0/5 hip felxion LLE Sensation: WNL   Cervical / Trunk Assessment Cervical / Trunk Assessment: Other exceptions Cervical / Trunk Exceptions: broken cervical bone?   Communication Communication Communication: No difficulties   Cognition Arousal/Alertness: Awake/alert Behavior During Therapy: WFL for tasks assessed/performed Overall Cognitive Status: Within Functional Limits for tasks assessed                                       General Comments       Exercises     Shoulder Instructions      Home Living Family/patient expects to be discharged to:: Private residence Living Arrangements: Spouse/significant other Available Help at Discharge: Family;Available 24 hours/day Type of Home: House Home Access: Ramped entrance     Home Layout: One level     Bathroom Shower/Tub: Teacher, early years/pre: Standard     Home Equipment: Conservation officer, nature (2 wheels);BSC/3in1;Wheelchair - manual;Shower seat          Prior Functioning/Environment Prior Level of Function : Needs assist;History of Falls (last six months) (2 falls per month)       Physical Assist : Mobility (physical);ADLs (physical) Mobility (physical): Transfers;Gait ADLs (physical): Bathing;Dressing;Toileting (Sponge baths,) Mobility Comments: fiance assists him with standing and transferring ADLs Comments: has  assistance for all ADLs        OT Problem List: Decreased strength;Decreased range of motion;Decreased activity tolerance;Impaired balance (sitting and/or standing);Decreased coordination;Decreased knowledge of use of DME or AE;Pain;Impaired UE functional use      OT Treatment/Interventions: Self-care/ADL training;Therapeutic exercise;DME and/or AE instruction;Therapeutic activities;Balance training;Patient/family education    OT Goals(Current goals can be found in the care plan section) Acute Rehab OT Goals Patient Stated Goal: to walk again OT Goal Formulation: With patient Time For Goal Achievement: 05/02/22 Potential to Achieve Goals: Fair  OT Frequency: Min 2X/week    Co-evaluation PT/OT/SLP Co-Evaluation/Treatment: Yes (co-eval) Reason for Co-Treatment: Complexity of the patient's impairments (multi-system involvement);To address functional/ADL transfers;For patient/therapist safety          AM-PAC OT "6 Clicks" Daily Activity     Outcome Measure Help from another person eating meals?: A Little Help from another person taking care of personal grooming?: A Little Help from another person toileting, which includes using toliet, bedpan, or urinal?: Total Help from another person bathing (including washing, rinsing, drying)?: A Lot Help from another person to put on and taking off regular upper body clothing?: A Lot Help from another person to put on and taking off regular lower body clothing?:  Total 6 Click Score: 12   End of Session Equipment Utilized During Treatment: Rolling walker (2 wheels) Nurse Communication: Mobility status  Activity Tolerance: Patient tolerated treatment well Patient left: in bed;with call bell/phone within reach;with family/visitor present  OT Visit Diagnosis: Hemiplegia and hemiparesis Hemiplegia - Right/Left: Left Hemiplegia - dominant/non-dominant: Dominant Hemiplegia - caused by: Other cerebrovascular disease                Time:  1400-1426 OT Time Calculation (min): 26 min Charges:  OT General Charges $OT Visit: 1 Visit OT Evaluation $OT Eval Low Complexity: 1 Low  Kallyn Demarcus, OTR/L [place]  Office [phone] Pager: [place] 04/18/2022, 2:41 PM

## 2022-04-18 NOTE — Progress Notes (Signed)
PT Cancellation Note  Patient Details Name: Gregory [name]. MRN: [id number] DOB: [birth year]   Cancelled Treatment:    Reason Eval/Treat Not Completed: (P) Pain limiting ability to participate; pt has been unable to void and is pain which caused him to be unable to tolerate radiation treatment today; is scheduled for in-and-out catheterization, requesting return after. Will follow up as schedule and pt status allows.   [name], PT, DPT [name] Office: [phone] Pager: [phone] [name] 04/18/2022, 12:09 PM

## 2022-04-18 NOTE — Evaluation (Signed)
Physical Therapy Evaluation Patient Details Name: Gregory [name]. MRN: [id number] DOB: [birth year] Today's Date: 04/18/2022  History of Present Illness  66 y.o. male presenting for left leg weakness that occurred when he went to stand up from bed.  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   Clinical Impression  Pt admitted with above diagnosis. Pt has had significant functional limitations over the past month secondary to weakening of LLE; at baseline he requires significant assist with transfers to [facility].  Pt currently with functional limitations due to the deficits listed below (see PT Problem List). Upon exam, pt demonstrated significant weakness of BLE, R foot drop, and nearly absent L hip flexion. Pt required min assist for bed mobility, mod assist +2 for transfers. Pt will benefit from skilled PT to increase their independence and safety with mobility to allow discharge to the venue listed below.  We will continue to follow acutely.               Recommendations for follow up therapy are one component of a multi-disciplinary discharge planning process, led by the attending physician.  Recommendations may be updated based on patient status, additional functional criteria and insurance authorization.  Follow Up Recommendations Skilled nursing-short term rehab (<3 hours/day) Can patient physically be transported by private vehicle: Yes    Assistance Recommended at Discharge Frequent or constant Supervision/Assistance  Patient can return home with the following  A lot of help with walking and/or transfers;A lot of help with bathing/dressing/bathroom;Assistance with cooking/housework;Assist for transportation;Help with stairs or ramp for entrance    Equipment Recommendations None recommended by PT (TBD at SNF)  Recommendations  for Other Services       Functional Status Assessment Patient has had a recent decline in their functional status and demonstrates the ability to make significant improvements in function in a reasonable and predictable amount of time.     Precautions / Restrictions Precautions Precautions: Fall Precaution Comments: 2 falls per month, LUE weak with shoulder tumor, LLE weakness Restrictions Weight Bearing Restrictions: No      Mobility  Bed Mobility Overal bed mobility: Needs Assistance Bed Mobility: Supine to Sit, Sit to Supine     Supine to sit: Min assist Sit to supine: Supervision   General bed mobility comments: Pt required min assist for trunk elevation, otherwise pt able to use bedrails and self-assist to come to EOB. During supine to sit, pt suddenly lay back down, unsure if pt lost trunk control due to fatigue or simply flung himself down into the bed.    Transfers Overall transfer level: Needs assistance Equipment used: Rolling walker (2 wheels) Transfers: Bed to chair/wheelchair/BSC, Sit to/from Stand Sit to Stand: Mod assist, +2 safety/equipment   Step pivot transfers: Mod assist, +2 safety/equipment, +2 physical assistance       General transfer comment: Pt required mod assist for sit to stand with assist to power up - patient heavily reliant on pulling himself up using RUE over therapist shoulders. Step pivot: pt required +2HHA with min assist +2 for steadying, min assist for sliding RLE on floor secondary to foot drop.    Ambulation/Gait               General Gait Details: deferred  Science writer  Modified Rankin (Stroke Patients Only)       Balance Overall balance assessment: Needs assistance Sitting-balance support: Feet supported Sitting balance-Leahy Scale: Poor Sitting balance - Comments: After mobility completed, pt lay back down suddenly   Standing balance support: Bilateral upper extremity supported,  Reliant on assistive device for balance Standing balance-Leahy Scale: Poor                               Pertinent Vitals/Pain Pain Assessment Pain Assessment: Faces Faces Pain Scale: Hurts little more Pain Location: BLEs Pain Descriptors / Indicators: Sore Pain Intervention(s): Limited activity within patient's tolerance, Monitored during session, Repositioned    Home Living Family/patient expects to be discharged to:: Private residence Living Arrangements: Spouse/significant other Available Help at Discharge: Family;Available 24 hours/day Type of Home: House Home Access: Ramped entrance       Home Layout: One level Home Equipment: Conservation officer, nature (2 wheels);BSC/3in1;Wheelchair - manual;Shower seat      Prior Function Prior Level of Function : Needs assist;History of Falls (last six months) (2 falls per month)       Physical Assist : Mobility (physical);ADLs (physical) Mobility (physical): Transfers;Gait ADLs (physical): Bathing;Dressing;Toileting (Sponge baths,) Mobility Comments: fiance assists him with standing and transferring ADLs Comments: has assistance for all ADLs     Hand Dominance   Dominant Hand: Right    Extremity/Trunk Assessment   Upper Extremity Assessment Upper Extremity Assessment: Defer to OT evaluation    Lower Extremity Assessment Lower Extremity Assessment: RLE deficits/detail;LLE deficits/detail RLE Deficits / Details: MMT grossly 4/5 with R foot drop 0/5, pt unable to DF. RLE Sensation: WNL LLE Deficits / Details: MMT grossly 4/5 except left hip flexion 2/5 LLE Sensation: WNL    Cervical / Trunk Assessment Cervical / Trunk Assessment: Other exceptions Cervical / Trunk Exceptions: broken cervical bone?  Communication   Communication: No difficulties  Cognition Arousal/Alertness: Awake/alert Behavior During Therapy: WFL for tasks assessed/performed Overall Cognitive Status: Within Functional Limits for tasks assessed                                           General Comments      Exercises Other Exercises Other Exercises: educated pt on using LUE as much as possible including for AAROM using RUE. Encouraged pt to complete BLE ROM exercises to tolerance.   Assessment/Plan    PT Assessment Patient needs continued PT services  PT Problem List Decreased strength;Decreased range of motion;Decreased activity tolerance;Decreased balance;Decreased mobility;Decreased coordination;Decreased knowledge of use of DME;Impaired sensation;Pain       PT Treatment Interventions DME instruction;Gait training;Stair training;Functional mobility training;Therapeutic activities;Therapeutic exercise;Balance training;Neuromuscular re-education;Patient/family education;Wheelchair mobility training    PT Goals (Current goals can be found in the Care Plan section)  Acute Rehab PT Goals Patient Stated Goal: "I would love to walk again" PT Goal Formulation: With patient Time For Goal Achievement: 05/02/22 Potential to Achieve Goals: Fair Additional Goals Additional Goal #1: Pt and caregiver will be educated on safe transfer technique from bed to [facility] and back and be able to demonstrate with verbal cues / supervision from PT for improved quality of life for both pt and caregiver.    Frequency Min 2X/week     Co-evaluation PT/OT/SLP Co-Evaluation/Treatment: Yes Reason for Co-Treatment: Complexity of the patient's impairments (multi-system involvement);To address functional/ADL transfers;For patient/therapist safety PT goals  addressed during session: Mobility/safety with mobility;Proper use of DME;Strengthening/ROM         AM-PAC PT "6 Clicks" Mobility  Outcome Measure Help needed turning from your back to your side while in a flat bed without using bedrails?: A Little Help needed moving from lying on your back to sitting on the side of a flat bed without using bedrails?: A Lot Help needed moving to and from  a bed to a chair (including a wheelchair)?: A Lot Help needed standing up from a chair using your arms (e.g., wheelchair or bedside chair)?: A Lot Help needed to walk in hospital room?: A Lot Help needed climbing 3-5 steps with a railing? : Total 6 Click Score: 12    End of Session Equipment Utilized During Treatment: Gait belt Activity Tolerance: Patient limited by fatigue Patient left: in bed;with call bell/phone within reach;with bed alarm set;with family/visitor present Nurse Communication: Mobility status PT Visit Diagnosis: Hemiplegia and hemiparesis;History of falling (Z91.81);Muscle weakness (generalized) (M62.81);Other abnormalities of gait and mobility (R26.89) Hemiplegia - Right/Left: Left    Time: 4540-9811 PT Time Calculation (min) (ACUTE ONLY): 26 min   Charges:   PT Evaluation $PT Eval Moderate Complexity: 1 Mod          [name], PT, DPT [place] [facility] Office: [phone] Pager: [phone]  Hernan Turnage 04/18/2022, 8:02 PM

## 2022-04-18 NOTE — Consult Note (Signed)
Consultation Note Date: 04/18/2022   Patient Name: Gregory [name].  DOB: 12-30-1955  MRN: [id number]  Age / Sex: 67 y.o., male  PCP: Pcp, No Referring Physician: Samuella Cota, MD  Reason for Consultation: Establishing goals of care  HPI/Patient Profile: 66 y.o. 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 Alen Blew; now with Dr. Delton Coombes), current smoker admitted on 04/17/2022 with weakness and cannot stand with known L4 nerve root compression and MRI results on 04/06/22 for C6 cord compression and pathological fracture. Extensive bony mets throughout spine and transfer from [facility] to [facility] for ongoing radiation therapy. With severe claustrophobia limiting ability to tolerate MRI.   Clinical Assessment and Goals of Care: I met today at [facility] bedside along with he and his significant other, Gregory [name] after reviewing records and notes including plans from medical and radiation oncology. Gregory [name] is very sleepy today after just having relief from bladder distention and severe pain and tells me "I feel great now." He has some strength in his left leg but struggles to break gravity but can move it in the bed. He has also experienced incontinence of stool recently as well. I did express my concern his worsening symptoms of weakness and pain as well as urine retention and bowel incontinence in relation to his known spinal mets and concern for compression of spinal cord with worsening symptoms. I discussed with them about radiation and steroids as main treatment for this. Gregory [name] is his primary caregiver and he has been living with her in [place] but then they have to drive over 1 hr one way to even get radiation treatment. This has been exhausting and difficult for them and he has been increasingly difficult for Gregory [name] to manage with his  worsening symptoms. Last systemic treatment was 04/12/22. I discussed with them the importance of having further conversation knowing the expectations with his progressing cancer and knowing that this could become more difficult for him into the future. They acknowledge the importance but at this time Gregory [name] just wants to rest. He agrees with a visit tomorrow to discuss this further. At this time he wishes to rest.   All questions/concerns addressed. Emotional support provided.   Primary Decision Maker PATIENT    SUMMARY OF RECOMMENDATIONS   - Ongoing goals of care discussions  Code Status/Advance Care Planning: Full code   Symptom Management:  Per attending.   Prognosis:  Overall prognosis poor with advanced cancer and declining functional status.   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 The following aspects are pertinent.  Past Medical History:  Diagnosis Date   Port-A-Cath in place 02/04/2022   Renal cell carcinoma ([place]) 10/23/2015   Skull lesion 10/20/2015   metastatic renal cell carcinoma   Social History   Socioeconomic History   Marital status: Single    Spouse name: Not on file   Number of children: Not on file   Years of education: Not on file   Highest education level: Not on file  Occupational History   Occupation: Programmer, systems: OTHER    Comment: [employer]  Tobacco Use   Smoking status: Every Day    Packs/day: 0.50    Years: 45.00    Total pack years: 22.50    Types: Cigarettes   Smokeless tobacco: Never  Vaping Use   Vaping  Use: Never used  Substance and Sexual Activity   Alcohol use: Not Currently    Alcohol/week: 20.0 standard drinks of alcohol    Types: 20 Shots of liquor per week    Comment: 1-5 shot of liquor per night   Drug use: Yes    Frequency: 1.0 times per week    Comment: occasionally   Sexual activity: Yes  Other Topics Concern   Not on file  Social History Narrative   Not on file   Social Determinants of Health   Financial Resource Strain: Not on file  Food Insecurity: Not on file  Transportation Needs: Not on file  Physical Activity: Not on file  Stress: Not on file  Social Connections: Not on file   Family History  Problem Relation Age of Onset   CAD Mother    CAD Father    Cancer Paternal Uncle        stomach cancer   Cancer Paternal Uncle        unknown   Scheduled Meds:  ascorbic acid  500 mg Oral Daily   cephALEXin  500 mg Oral QID   Chlorhexidine Gluconate Cloth  6 each Topical Daily   cholecalciferol  4,000 Units Oral Daily   cyanocobalamin  1,000 mcg Oral Daily   dexamethasone (DECADRON) injection  4 mg Intravenous Q6H   [START ON 04/19/2022] dexamethasone  4 mg Oral Q12H   gabapentin  100 mg Oral TID   heparin  5,000 Units Subcutaneous Q8H   nicotine  14 mg Transdermal Daily   senna-docusate  1 tablet Oral BID   sodium chloride flush  3 mL Intravenous Q12H   Continuous Infusions:  sodium chloride 50 mL/hr at 04/17/22 2337   sodium chloride Stopped (04/17/22 1533)   PRN Meds:.acetaminophen **OR** acetaminophen, bisacodyl, hydrALAZINE, HYDROmorphone (DILAUDID) injection, ipratropium, levalbuterol, LORazepam, ondansetron **OR** ondansetron (ZOFRAN) IV, oxyCODONE, senna-docusate, sodium phosphate, traZODone No Known Allergies Review of Systems  Constitutional:  Positive for activity change, appetite change and fatigue.  Musculoskeletal:  Positive for back pain.  Neurological:  Positive for weakness.    Physical Exam Vitals and nursing note reviewed.   Constitutional:      General: He is not in acute distress.    Appearance: He is ill-appearing.     Comments: Thin, frail  Cardiovascular:     Rate and Rhythm: Normal rate.  Pulmonary:     Effort: No tachypnea, accessory muscle usage or respiratory distress.  Abdominal:     Palpations: Abdomen is soft.  Neurological:     Mental Status: He is alert and oriented to person, place, and time.     Vital Signs: BP 107/66 (BP Location: Right Arm)   [name]  75   Temp 98.2 F (36.8 C) (Oral)   Resp 18   Ht _0  (1.702 m)   Wt 59.1 kg   SpO2 97%   BMI 20.41 kg/m  Pain Scale: 0-10 POSS *See Group Information*: S-Acceptable,Sleep, easy to arouse Pain Score: 4    SpO2: SpO2: 97 % O2 Device:SpO2: 97 % O2 Flow Rate: .   IO: Intake/output summary:  Intake/Output Summary (Last 24 hours) at 04/18/2022 0900 Last data filed at 04/17/2022 2331 Gross per 24 hour  Intake 737.91 ml  Output --  Net 737.91 ml    LBM: Last BM Date : 04/17/22 Baseline Weight: Weight: 52 kg Most recent weight: Weight: 59.1 kg     Palliative Assessment/Data:    Time In: 1245  Time Total: 55 min  Greater than 50%  of this time was spent counseling and coordinating care related to the above assessment and plan.  Signed by: Vinie Sill, NP Palliative Medicine Team Pager # [phone] (M-F 8a-5p) Team Phone # [phone] (Nights/Weekends)

## 2022-04-18 NOTE — Plan of Care (Signed)

## 2022-04-18 NOTE — Progress Notes (Signed)
  Progress Note   Patient: Gregory [name]. QQV:[id number] DOB: 1955/12/27 DOA: 04/17/2022     0 DOS: the patient was seen and examined on 04/18/2022   Brief hospital course: 66 year old man PMH metastatic renal cell cancer with widespread bony metastasis to the spine, currently undergoing radiation therapy for known epidural tumor, undergoing chemotherapy, presented with worsening left leg weakness to any pain.  After discussion with his oncologist, plan was made to transfer to [facility] for radiation therapy.  Patient refused MRI imaging.  Assessment and Plan: Left leg weakness, acute on chronic secondary to metastatic renal cell carcinoma, metastatic sinuses to bones, lungs, liver. --Patient refused MRI secondary to severe claustrophobia. --Continue radiation therapy per radiation oncology.  Renal cell carcinoma ([place]) --Currently undergoing radiation and chemotherapy. Primary oncologist Dr. Delton Coombes and radiation oncologist Dr. Tammi Klippel  Extensive metastatic disease throughout the cervical spine with pathologic fracture and osseous retropulsion at C6 with epidural paraspinal tumor causing cord compression.  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  Epidural disease present L4. --Continue Decadron per Dr. Delton Coombes -- Radiation therapy -- Pain control  Port-A-Cath in place  Tobacco use disorder -- NicoDerm patch  HIV screen reactive -- Suspect spurious.  Follow-up confirmatory testing.     Subjective:  Feels ok right now Pain controlled Left leg is weak Had urinary retention earlier  Physical Exam: Vitals:   04/18/22 0342 04/18/22 0500 04/18/22 0550 04/18/22 1359  BP: 126/72  107/66 109/74  Pulse: 74  75 77  Resp: 18   17  Temp: 98 F (36.7 C)  98.2 F (36.8 C) 98.1 F (36.7 C)  TempSrc: Oral  Oral Oral  SpO2: 97%  97% 99%  Weight:  59.1 kg    Height:       Physical Exam Vitals reviewed.  Constitutional:       General: He is not in acute distress.    Appearance: He is not ill-appearing or toxic-appearing.  Cardiovascular:     Rate and Rhythm: Normal rate and regular rhythm.     Heart sounds: No murmur heard. Pulmonary:     Effort: Pulmonary effort is normal. No respiratory distress.     Breath sounds: No wheezing or rales.  Musculoskeletal:     Right lower leg: No edema.     Left lower leg: No edema.     Comments: Moves both legs to command, left weaker than right  Neurological:     Mental Status: He is alert.  Psychiatric:        Mood and Affect: Mood normal.        Behavior: Behavior normal.     Data Reviewed:  No new labs Phos was 1.9 yesterday  Family Communication: none  Disposition: Status is: Inpatient Remains inpatient appropriate because: needs XRT  Planned Discharge Destination: Home    Time spent: 35 minutes  Author: Murray Hodgkins, MD 04/18/2022 8:48 PM  For on call review [email or web address].

## 2022-04-19 ENCOUNTER — Ambulatory Visit
Admission: RE | Admit: 2022-04-19 | Discharge: 2022-04-19 | Disposition: A | Discharge status: Home / Self Care | Payer: PPO | Source: Ambulatory Visit | Attending: Radiation Oncology | Admitting: Radiation Oncology

## 2022-04-19 ENCOUNTER — Ambulatory Visit: Payer: PPO | Admitting: Radiation Oncology

## 2022-04-19 ENCOUNTER — Other Ambulatory Visit: Payer: Self-pay

## 2022-04-19 DIAGNOSIS — D497 Neoplasm of unspecified behavior of endocrine glands and other parts of nervous system: Secondary | ICD-10-CM | POA: Diagnosis not present

## 2022-04-19 DIAGNOSIS — R29898 Other symptoms and signs involving the musculoskeletal system: Secondary | ICD-10-CM | POA: Diagnosis not present

## 2022-04-19 DIAGNOSIS — C649 Malignant neoplasm of unspecified kidney, except renal pelvis: Secondary | ICD-10-CM | POA: Diagnosis not present

## 2022-04-19 DIAGNOSIS — Z515 Encounter for palliative care: Secondary | ICD-10-CM

## 2022-04-19 DIAGNOSIS — Z7189 Other specified counseling: Secondary | ICD-10-CM | POA: Diagnosis not present

## 2022-04-19 DIAGNOSIS — C7951 Secondary malignant neoplasm of bone: Secondary | ICD-10-CM | POA: Diagnosis not present

## 2022-04-19 LAB — RAD ONC ARIA SESSION SUMMARY
Course Elapsed Days: 10
Plan Fractions Treated to Date: 4
Plan Fractions Treated to Date: 4
Plan Fractions Treated to Date: 7
Plan Prescribed Dose Per Fraction: 10 Gy
Plan Prescribed Dose Per Fraction: 10 Gy
Plan Prescribed Dose Per Fraction: 3 Gy
Plan Total Fractions Prescribed: 10
Plan Total Fractions Prescribed: 5
Plan Total Fractions Prescribed: 5
Plan Total Prescribed Dose: 30 Gy
Plan Total Prescribed Dose: 50 Gy
Plan Total Prescribed Dose: 50 Gy
Reference Point Dosage Given to Date: 21 Gy
Reference Point Dosage Given to Date: 40 Gy
Reference Point Dosage Given to Date: 40 Gy
Reference Point Session Dosage Given: 10 Gy
Reference Point Session Dosage Given: 10 Gy
Reference Point Session Dosage Given: 3 Gy
Session Number: 8

## 2022-04-19 LAB — GLUCOSE, CAPILLARY: Glucose-Capillary: 94 mg/dL (ref 70–99)

## 2022-04-19 MED ORDER — ENSURE ENLIVE PO LIQD
237.0000 mL | Freq: Two times a day (BID) | ORAL | Status: DC
  Administered 2022-04-20 – 2022-05-09 (×20): 237 mL via ORAL

## 2022-04-19 NOTE — NC FL2 (Signed)
[place] LEVEL OF CARE SCREENING TOOL     IDENTIFICATION  Patient Name: Gregory [name]. Birthdate: 31-Mar-1956 Sex: male Admission Date (Current Location): 04/17/2022  [facility] and [place] Number:  Herbalist and Address:  [facility],  [address] [place], [place]      Provider Number: [id number]  Attending Physician Name and Address:  Samuella Cota, MD  Relative Name and Phone Number:  Gwynneth Albright (Daughter)   [phone]    Current Level of Care: Hospital Recommended Level of Care: [place] Prior Approval Number:    Date Approved/Denied:   PASRR Number: [id number]  Discharge Plan: SNF    Current Diagnoses: Patient Active Problem List   Diagnosis Date Noted   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   L4 spinal cord injury ([place]) 04/17/2022   Left leg weakness 04/17/2022   Port-A-Cath in place 02/04/2022   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 ([place]) 01/19/2022   History of malignant neoplasm metastatic to brain 10/28/2020   Tobacco use disorder 06/18/2016   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   Metastatic renal cell carcinoma to bone ([place]) 11/06/2015   Renal cell carcinoma ([place]) 10/23/2015   Skull lesion 10/20/2015    Orientation RESPIRATION BLADDER Height & Weight     Self, Time, Situation, Place  Normal Continent Weight: 128 lb 15.5 oz (58.5 kg) Height:  '5\' 7"'$  (170.2 cm)  BEHAVIORAL SYMPTOMS/MOOD NEUROLOGICAL BOWEL NUTRITION STATUS      Continent Diet (regular)  AMBULATORY STATUS COMMUNICATION OF NEEDS Skin   Extensive Assist Verbally Other (Comment) (non pressure wound -right groin under scrotum)                       Personal Care Assistance Level of Assistance  Bathing, Feeding, Dressing Bathing Assistance: Limited assistance Feeding assistance: Independent Dressing Assistance: Limited assistance     Functional Limitations Info  Sight, Hearing, Speech Sight Info:  Adequate Hearing Info: Adequate Speech Info: Adequate    SPECIAL CARE FACTORS FREQUENCY  PT (By licensed PT), OT (By licensed OT)     PT Frequency: 5 x a week OT Frequency: 5 x a week            Contractures Contractures Info: Not present    Additional Factors Info  Code Status, Allergies Code Status Info: full Allergies Info: no known Allergies           Current Medications (04/19/2022):  This is the current hospital active medication list Current Facility-Administered Medications  Medication Dose Route Frequency Provider Last Rate Last Admin   acetaminophen (TYLENOL) tablet 650 mg  650 mg Oral Q6H PRN Shahmehdi, Seyed A, MD       Or   acetaminophen (TYLENOL) suppository 650 mg  650 mg Rectal Q6H PRN Shahmehdi, Seyed A, MD       ascorbic acid (VITAMIN C) tablet 500 mg  500 mg Oral Daily Shahmehdi, Seyed A, MD   500 mg at 04/19/22 5631   bisacodyl (DULCOLAX) EC tablet 5 mg  5 mg Oral Daily PRN Shahmehdi, Seyed A, MD       cephALEXin (KEFLEX) capsule 500 mg  500 mg Oral QID Shahmehdi, Seyed A, MD   500 mg at 04/19/22 4970   Chlorhexidine Gluconate Cloth 2 % PADS 6 each  6 each Topical Daily [name], MD   6 each at 04/18/22 1240   cholecalciferol (VITAMIN D3) 25 MCG (1000 UNIT) tablet 4,000 Units  4,000 Units Oral Daily [name], MD   4,000 Units at 04/19/22 7121   cyanocobalamin (VITAMIN B12) tablet 1,000 mcg  1,000 mcg Oral Daily Shahmehdi, Seyed A, MD   1,000 mcg at 04/19/22 0928   dexamethasone (DECADRON) tablet 4 mg  4 mg Oral Q12H Shahmehdi, Seyed A, MD   4 mg at 04/19/22 9758   gabapentin (NEURONTIN) capsule 100 mg  100 mg Oral TID Skipper Cliche A, MD   100 mg at 04/19/22 0929   heparin injection 5,000 Units  5,000 Units Subcutaneous Q8H Shahmehdi, Erling Conte A, MD   5,000 Units at 04/19/22 0513   hydrALAZINE (APRESOLINE) injection 10 mg  10 mg Intravenous Q4H PRN Shahmehdi, Seyed A, MD       HYDROmorphone (DILAUDID) injection 0.5-1 mg  0.5-1 mg  Intravenous Q2H PRN Samuella Cota, MD       ipratropium (ATROVENT) nebulizer solution 0.5 mg  0.5 mg Nebulization Q6H PRN Shahmehdi, Seyed A, MD       levalbuterol (XOPENEX) nebulizer solution 0.63 mg  0.63 mg Nebulization Q6H PRN Shahmehdi, Seyed A, MD       LORazepam (ATIVAN) tablet 1 mg  1 mg Oral QHS PRN Shahmehdi, Seyed A, MD   1 mg at 04/18/22 2148   nicotine (NICODERM CQ - dosed in mg/24 hours) patch 14 mg  14 mg Transdermal Daily Shahmehdi, Seyed A, MD   14 mg at 04/19/22 0929   ondansetron (ZOFRAN) tablet 4 mg  4 mg Oral Q6H PRN Shahmehdi, Seyed A, MD       Or   ondansetron (ZOFRAN) injection 4 mg  4 mg Intravenous Q6H PRN Shahmehdi, Seyed A, MD       oxyCODONE (Oxy IR/ROXICODONE) immediate release tablet 10 mg  10 mg Oral Q4H PRN Samuella Cota, MD   10 mg at 04/19/22 0803   senna-docusate (Senokot-S) tablet 1 tablet  1 tablet Oral QHS PRN Shahmehdi, Seyed A, MD       senna-docusate (Senokot-S) tablet 1 tablet  1 tablet Oral BID Skipper Cliche A, MD   1 tablet at 04/19/22 8325   sodium chloride flush (NS) 0.9 % injection 3 mL  3 mL Intravenous Q12H Shahmehdi, Seyed A, MD   3 mL at 04/19/22 0930   sodium phosphate (FLEET) 7-19 GM/118ML enema 1 enema  1 enema Rectal Once PRN Shahmehdi, Seyed A, MD       traZODone (DESYREL) tablet 25 mg  25 mg Oral QHS PRN [name], MD   25 mg at 04/17/22 2109     Discharge Medications: Please see discharge summary for a list of discharge medications.  Relevant Imaging Results:  Relevant Lab Results:   Additional Information [phone]  [place], LCSW

## 2022-04-19 NOTE — TOC Initial Note (Signed)
Transition of Care (TOC) - Initial/Assessment Note    Patient Details  Name: Gregory [name]. MRN: [id number] Date of Birth: [birth year]  Transition of Care [facility]) CM/SW Contact:    Illene Regulus, LCSW Phone Number: 04/19/2022, 10:30 AM  Clinical Narrative:                 CSW spoke with pt and his Gf, they reported living together. Pt said he uses a wheelchair at baseline. Pt's GF stated she has been helping pt with transfers. Pt reported having a wheelchair, 3in 1, and walker at home. CSW discussed PT's recommendations for SNF placement with pt and his family, pt has agreed to SNF placement. Pt is requesting his information sent out to [facility] [place] and to [facility]. CSW will work on SNF work up. TOC to follow.   Expected Discharge Plan: Skilled Nursing Facility Barriers to Discharge: Continued Medical Work up, SNF Pending bed offer, Insurance Authorization   Patient Goals and CMS Choice Patient states their goals for this hospitalization and ongoing recovery are:: go to rehab CMS Medicare.gov Compare Post Acute Care list provided to:: Patient Represenative (must comment) Choice offered to / list presented to : Patient, Spouse  Expected Discharge Plan and Services Expected Discharge Plan: [place]       Living arrangements for the past 2 months: Single Family Home                                      Prior Living Arrangements/Services Living arrangements for the past 2 months: Single Family Home Lives with:: Self, Domestic Partner Patient language and need for interpreter reviewed:: Yes Do you feel safe going back to the place where you live?: Yes      Need for Family Participation in Patient Care: Yes (Comment) Care giver support system in place?: Yes (comment) Current home services: DME Criminal Activity/Legal Involvement Pertinent to Current Situation/Hospitalization: No - Comment as  needed  Activities of Daily Living Home Assistive Devices/Equipment: Wheelchair ADL Screening (condition at time of admission) Patient's cognitive ability adequate to safely complete daily activities?: Yes Is the patient deaf or have difficulty hearing?: No Does the patient have difficulty seeing, even when wearing glasses/contacts?: No Does the patient have difficulty concentrating, remembering, or making decisions?: No Patient able to express need for assistance with ADLs?: Yes Does the patient have difficulty dressing or bathing?: Yes Independently performs ADLs?: No Communication: Independent Dressing (OT): Needs assistance Is this a change from baseline?: Change from baseline, expected to last >3 days Grooming: Needs assistance Is this a change from baseline?: Change from baseline, expected to last >3 days Feeding: Independent Bathing: Needs assistance Is this a change from baseline?: Change from baseline, expected to last >3 days Toileting: Dependent Is this a change from baseline?: Change from baseline, expected to last >3days In/Out Bed: Dependent Is this a change from baseline?: Change from baseline, expected to last >3 days Walks in Home: Dependent Is this a change from baseline?: Change from baseline, expected to last >3 days Does the patient have difficulty walking or climbing stairs?: Yes Weakness of Legs: Both Weakness of Arms/Hands: Left  Permission Sought/Granted   Permission granted to share information with : Yes, Verbal Permission Granted              Emotional Assessment Appearance:: Appears stated age Attitude/Demeanor/Rapport: Lethargic, Gracious  Affect (typically observed): Accepting Orientation: : Oriented to Self, Oriented to Place, Oriented to  Time, Oriented to Situation Alcohol / Substance Use: Not Applicable Psych Involvement: No (comment)  Admission diagnosis:  Left leg weakness [R29.898] Intradural extramedullary spinal tumor  [D49.7] Lumbar nerve root compression [M54.16] Metastatic malignant neoplasm, unspecified site [facility]) [C79.9] Patient Active Problem List   Diagnosis Date Noted   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   L4 spinal cord injury ([place]) 04/17/2022    Class: Acute   Left leg weakness 04/17/2022    Class: Acute   Port-A-Cath in place 02/04/2022   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

## 2022-04-19 NOTE — Progress Notes (Signed)
  Progress Note   Patient: Gregory [name]. UJW:[id number] DOB: 20-Jul-1956 DOA: 04/17/2022     1 DOS: the patient was seen and examined on 04/19/2022   Brief hospital course: 66 year old man PMH metastatic renal cell cancer with widespread bony metastasis to the spine, currently undergoing radiation therapy for known epidural tumor, undergoing chemotherapy, presented with worsening left leg weakness to any pain.  After discussion with his oncologist, plan was made to transfer to [facility] for radiation therapy.  Patient refused MRI imaging.  Assessment and Plan: Left leg weakness, acute on chronic secondary to metastatic renal cell carcinoma, metastatic disease to bones, lungs, liver. --Patient refused MRI secondary to severe claustrophobia. --Continue radiation therapy per radiation oncology.   Renal cell carcinoma ([place]) --Currently undergoing radiation and chemotherapy. Primary oncologist Dr. Delton Coombes and radiation oncologist Dr. 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  Epidural disease present L4. --Continue Decadron per Dr. Delton Coombes --Continue Radiation therapy -- Pain control   Port-A-Cath in place   Tobacco use disorder -- NicoDerm patch   HIV screen reactive -- Suspect spurious.  Follow-up confirmatory testing.      Subjective:  Feels about the same Urinating ok now after difficulty earlier. Pain controlled. No new issues voiced.  Physical Exam: Vitals:   04/18/22 2104 04/19/22 0500 04/19/22 0557 04/19/22 1415  BP: 119/78  106/75 111/71  Pulse: 72  74 84  Resp: '18  18 16  '$ Temp: 98.6 F (37 C)  97.9 F (36.6 C) 97.8 F (36.6 C)  TempSrc: Oral  Oral Oral  SpO2: 99%  97% 95%  Weight:  58.5 kg    Height:       Physical Exam Vitals  reviewed.  Constitutional:      General: He is not in acute distress.    Appearance: He is not ill-appearing or toxic-appearing.  Cardiovascular:     Rate and Rhythm: Normal rate and regular rhythm.     Heart sounds: No murmur heard. Pulmonary:     Effort: Pulmonary effort is normal. No respiratory distress.     Breath sounds: No wheezing, rhonchi or rales.  Neurological:     Mental Status: He is alert.     Comments: Moves both legs  Psychiatric:        Mood and Affect: Mood normal.        Behavior: Behavior normal.     Data Reviewed:  There are no new results to review at this time.  Family Communication: girlfriend at bedside  Disposition: Status is: Inpatient Remains inpatient appropriate because: inpatient XRT  Planned Discharge Destination: Skilled nursing facility    Time spent: 20 minutes  Author: Murray Hodgkins, MD 04/19/2022 5:28 PM  For on call review [email or web address].

## 2022-04-19 NOTE — TOC Progression Note (Signed)
Transition of Care (TOC) - Progression Note    Patient Details  Name: Gregory [name]. MRN: [id number] Date of Birth: [birth year]  Transition of Care [facility]) CM/SW [place], LCSW Phone Number: 04/19/2022, 3:54 PM  Clinical Narrative:    CSW spoke with [place] [phone]) admission with [facility] rehab, she reported having open rehab beds and is willing to review pt's information.CSW faxed pt's information out to facility. TOC to follow.    Expected Discharge Plan: [place] Barriers to Discharge: Continued Medical Work up, SNF Pending bed offer, Ship broker  Expected Discharge Plan and Services Expected Discharge Plan: [place] arrangements for the past 2 months: Single Family Home                                       Social Determinants of Health (SDOH) Interventions    Readmission Risk Interventions     No data to display

## 2022-04-19 NOTE — Plan of Care (Signed)

## 2022-04-19 NOTE — Progress Notes (Signed)
PT Cancellation Note  Patient Details Name: Gregory [name]. MRN: [id number] DOB: 08-22-1956   Cancelled Treatment:    Reason Eval/Treat Not Completed: (P) Patient at procedure or test/unavailable. Pt receiving radiation treatment, will follow up as schedule and pt status allows which may be another day.  [name], PT, DPT [facility] Department Office: [phone] Pager: [phone] Somtochukwu Woollard 04/19/2022, 11:50 AM

## 2022-04-19 NOTE — Progress Notes (Signed)
Palliative:  HPI: 66 y.o. 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 Alen Blew; now with Dr. Delton Coombes), current smoker admitted on 04/17/2022 with weakness and cannot stand with known L4 nerve root compression and MRI results on 04/06/22 for C6 cord compression and pathological fracture. Extensive bony mets throughout spine and transfer from [facility] to [facility] for ongoing radiation therapy. With severe claustrophobia limiting ability to tolerate MRI.   I met again today with Gregory [name]. He is lying in bed and is initially sleeping but awakens shortly after I enter room. We discussed his pain and condition. He reports that he is feeling much better than he was yesterday. He is tired after radiation. I discussed with he and [name] at bedside the importance of Advance Directives. I left him a copy to review. He asks me "you mean because I am dying?" I explained that we do know that he has cancer we cannot cure and this is unfortunately a life limiting disease. He expresses understanding. I discussed with him the importance of considering code status. They share that he has one daughter and she is nurse oncology navigator in [place], [place]. Discussed expectations and outcomes of resuscitation with his current medical condition. He shares with me that he trusts his daughter and what she thinks is best. I discussed with him the importance of following his wishes and the importance of considering his wishes now before an emergency situation. I also encouraged that this is the best gift he can give his family to take the weight of this decision of their shoulders and make this decision himself. He expresses understanding and that he will consider his wishes. [name] was appropriately tearful - she shares that her husband had died so she has experience with end of life. I offered to call his daughter for update and questions but he declines.   All  questions/concerns addressed. Emotional support provided.   Exam: Alert, oriented. Thin, frail. No distress. Breathing regular, unlabored, Abd soft. Moves all extremities. LLE weakness.   Plan: - Ongoing goals of care conversation.  - Advance Directive packet provided.  - Plans for SNF rehab - would benefit from ongoing outpatient palliative care.   [place], NP Palliative Medicine Team Pager [phone] (Please see [email or web address] for schedule) Team Phone [phone]    Greater than 50%  of this time was spent counseling and coordinating care related to the above assessment and plan

## 2022-04-20 ENCOUNTER — Ambulatory Visit: Payer: PPO

## 2022-04-20 ENCOUNTER — Other Ambulatory Visit: Payer: Self-pay

## 2022-04-20 ENCOUNTER — Ambulatory Visit: Payer: PPO | Admitting: Radiation Oncology

## 2022-04-20 ENCOUNTER — Ambulatory Visit
Admission: RE | Admit: 2022-04-20 | Discharge: 2022-04-20 | Disposition: A | Discharge status: Home / Self Care | Payer: PPO | Source: Ambulatory Visit | Attending: Radiation Oncology | Admitting: Radiation Oncology

## 2022-04-20 DIAGNOSIS — D497 Neoplasm of unspecified behavior of endocrine glands and other parts of nervous system: Secondary | ICD-10-CM | POA: Diagnosis not present

## 2022-04-20 DIAGNOSIS — C649 Malignant neoplasm of unspecified kidney, except renal pelvis: Secondary | ICD-10-CM | POA: Diagnosis not present

## 2022-04-20 DIAGNOSIS — C7951 Secondary malignant neoplasm of bone: Secondary | ICD-10-CM | POA: Diagnosis not present

## 2022-04-20 DIAGNOSIS — R29898 Other symptoms and signs involving the musculoskeletal system: Secondary | ICD-10-CM | POA: Diagnosis not present

## 2022-04-20 LAB — RAD ONC ARIA SESSION SUMMARY
Course Elapsed Days: 11
Plan Fractions Treated to Date: 8
Plan Prescribed Dose Per Fraction: 3 Gy
Plan Total Fractions Prescribed: 10
Plan Total Prescribed Dose: 30 Gy
Reference Point Dosage Given to Date: 24 Gy
Reference Point Session Dosage Given: 3 Gy
Session Number: 9

## 2022-04-20 LAB — HIV-1/2 AB - DIFFERENTIATION
HIV 1 Ab: NONREACTIVE
HIV 2 Ab: NONREACTIVE
Note: NEGATIVE

## 2022-04-20 LAB — HIV-1/HIV-2 QUALITATIVE RNA
Final Interpretation: NEGATIVE
HIV-1 RNA, Qualitative: NONREACTIVE
HIV-2 RNA, Qualitative: NONREACTIVE

## 2022-04-20 LAB — GLUCOSE, CAPILLARY: Glucose-Capillary: 101 mg/dL — ABNORMAL HIGH (ref 70–99)

## 2022-04-20 NOTE — Progress Notes (Signed)
Physical Therapy Treatment Patient Details Name: Gregory [name]. MRN: [id number] DOB: 03-30-1956 Today's Date: 04/20/2022   History of Present Illness 66 y.o. male presenting for left leg weakness that occurred when he went to stand up from bed.  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    PT Comments    General Comments: AxO x 3 requires MAX encouragement.  Relies heavily on Fiancee to care for him.  "Been staying in bed alot lately". General bed mobility comments: had Fiancee demonstarte how she assist getting pt to EOB.  Pt required Mod Assist to support upper body due to non function L UE (shoulder) and Mod Assist for L LE due to weakness.  "That whole left side is weak" stated pt.  Pt was bale to static sit EOB at Close Supervision level.  Pt using R UE to support self. General transfer comment: Had Fiancee demonstarte how she assists pt from bed to recliner (would be a wheelchair at home).  She has pt throw B UE u and around her as she pulls near waist to perform sit to stand.  Once standing, pt is briefly able to support self with R LE but L LE buckles.  1/4 pivot turn, pt and Fiancee "dance around" [facility] with difficulty moving that L LE.  Then she assists to control stand to sit.  "he was able to help me more before all this" stated Fiance. General Gait Details: pt has been non amb "for some time" and uses a wheelchair at home.  Positioned pt in recliner with multiple pillows.  "Now I can't sit here too long, my back will start to really hurt".  Agreed to return in 30 min to assist pt back to bed.  Lunch tray just arrived.   Pt will need ST Rehab at SNF to address mobility and functional decline prior to safely returning home.   Recommendations for follow up therapy are one component of a multi-disciplinary discharge planning  process, led by the attending physician.  Recommendations may be updated based on patient status, additional functional criteria and insurance authorization.  Follow Up Recommendations  Skilled nursing-short term rehab (<3 hours/day) Can patient physically be transported by private vehicle: Yes   Assistance Recommended at Discharge Frequent or constant Supervision/Assistance  Patient can return home with the following A lot of help with walking and/or transfers;A lot of help with bathing/dressing/bathroom;Assistance with cooking/housework;Assist for transportation;Help with stairs or ramp for entrance   Equipment Recommendations  None recommended by PT    Recommendations for Other Services       Precautions / Restrictions Precautions Precautions: Fall Precaution Comments: 2 falls per month, LUE weak with shoulder tumor, LLE weakness, R foot drop Restrictions Weight Bearing Restrictions: No     Mobility  Bed Mobility Overal bed mobility: Needs Assistance Bed Mobility: Supine to Sit     Supine to sit: Mod assist     General bed mobility comments: had Fiancee demonstarte how she assist getting pt to EOB.  Pt required Mod Assist to support upper body due to non function L UE (shoulder) and Mod Assist for L LE due to weakness.  "That whole left side is weak" stated pt.  Pt was bale to static sit EOB at Close Supervision level.  Pt using R UE to support self.  Transfers Overall transfer level: Needs assistance Equipment used: None Transfers: Bed to chair/wheelchair/BSC   Stand pivot transfers: Mod assist, Max assist         General transfer comment: Had Celesta Gentile demonstarte how she assists pt from bed to recliner (would be a wheelchair at home).  She has pt throw B UE u and around her as she pulls near waist to perform sit to stand.  Once standing, pt is briefly able to support self with R LE but L LE buckles.  1/4 pivot turn, pt and Fiancee "dance around" [facility] with  difficulty moving that L LE.  Then she assists to control stand to sit.  "he was able to help me more before all this" stated Fiance.    Ambulation/Gait               General Gait Details: pt has been non amb "for some time" and uses a wheelchair at home   Stairs             Wheelchair Mobility    Modified Rankin (Stroke Patients Only)       Balance                                            Cognition Arousal/Alertness: Awake/alert Behavior During Therapy: WFL for tasks assessed/performed Overall Cognitive Status: Within Functional Limits for tasks assessed                                 General Comments: AxO x 3 requires MAX encouragement.  Relies heavily on Fiancee to care for him.  "Been staying in bed alot lately".        Exercises      General Comments        Pertinent Vitals/Pain Pain Assessment Pain Assessment: Faces Faces Pain Scale: Hurts little more Pain Location: back Pain Descriptors / Indicators: Constant, Grimacing Pain Intervention(s): Monitored during session, Repositioned    Home Living                          Prior Function            PT Goals (current goals can now be found in the care plan section) Progress towards PT goals: Progressing toward goals    Frequency    Min 2X/week      PT Plan Current plan remains appropriate    Co-evaluation       OT goals addressed during session: Strengthening/ROM      AM-PAC PT "6 Clicks" Mobility   Outcome Measure  Help needed turning from your back to your side while in a flat bed without using bedrails?: A Lot Help needed moving from lying on your back to sitting on the side of a flat bed without using bedrails?: A Lot Help needed moving to and from a bed to a chair (including a wheelchair)?: A Lot Help needed standing up from a chair using your arms (e.g., wheelchair or bedside chair)?: A Lot Help needed to walk in hospital  room?: Total Help needed climbing 3-5 steps with a railing? : Total 6 Click Score: 10    End of Session Equipment Utilized During Treatment: Gait belt Activity Tolerance: Patient limited by fatigue;Patient limited by pain Patient left: in chair;with call  bell/phone within reach;with family/visitor present Nurse Communication: Mobility status PT Visit Diagnosis: Hemiplegia and hemiparesis;History of falling (Z91.81);Muscle weakness (generalized) (M62.81);Other abnormalities of gait and mobility (R26.89) Hemiplegia - Right/Left: Left     Time: 2423-5361 PT Time Calculation (min) (ACUTE ONLY): 18 min  Charges:  $Therapeutic Activity: 8-22 mins                     Rica Koyanagi  PTA Acute  Rehabilitation Services Office M-F          [phone] Weekend pager [phone]

## 2022-04-20 NOTE — Plan of Care (Signed)
Pt reported this am of 7/10.  PRNs administered per MAR.  No other issues or complaints at this time.  Problem: Activity: Goal: Risk for activity intolerance will decrease Outcome: Not Progressing   Problem: Skin Integrity: Goal: Risk for impaired skin integrity will decrease Outcome: Not Progressing

## 2022-04-20 NOTE — Progress Notes (Signed)
  Progress Note   Patient: Gregory [name]. NOI:[id number] DOB: 05/07/1956 DOA: 04/17/2022     2 DOS: the patient was seen and examined on 04/20/2022   Brief hospital course: 66 year old man PMH metastatic renal cell cancer with widespread bony metastasis to the spine, currently undergoing radiation therapy for known epidural tumor, undergoing chemotherapy, presented with worsening left leg weakness to any pain.  After discussion with his oncologist, plan was made to transfer to [facility] for radiation therapy.  Patient refused MRI imaging.  Assessment and Plan: Left leg weakness, acute on chronic secondary to metastatic renal cell carcinoma, metastatic disease to bones, lungs, liver. --Patient refused MRI secondary to severe claustrophobia. --Continue radiation therapy per radiation oncology.   Renal cell carcinoma ([place]) --Currently undergoing radiation and chemotherapy. Primary oncologist Dr. Delton Coombes and radiation oncologist Dr. 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  Epidural disease present L4. --Continue Decadron per Dr. Delton Coombes --Continue Radiation therapy -- Pain control   Port-A-Cath in place   Tobacco use disorder -- NicoDerm patch   HIV screen reactive -- Suspect spurious.  Follow-up confirmatory testing.      Subjective:  Feels ok No reported changes  Physical Exam: Vitals:   04/19/22 1415 04/19/22 2245 04/20/22 0713 04/20/22 1341  BP: 111/71 113/81 106/76 108/73  Pulse: 84 81 83 93  Resp: '16 18 20 16  '$ Temp: 97.8 F (36.6 C) 98.4 F (36.9 C) 98.1 F (36.7 C) 98.1 F (36.7 C)  TempSrc: Oral Oral Oral Oral  SpO2: 95% 98% 98% 97%  Weight:   56.3 kg   Height:       Physical Exam Vitals reviewed.  Constitutional:      General:  He is not in acute distress.    Appearance: He is not ill-appearing or toxic-appearing.  Cardiovascular:     Rate and Rhythm: Normal rate and regular rhythm.     Heart sounds: No murmur heard. Pulmonary:     Effort: Pulmonary effort is normal. No respiratory distress.     Breath sounds: No wheezing, rhonchi or rales.  Musculoskeletal:     Comments: Moves both legs and arms to command  Neurological:     Mental Status: He is alert.  Psychiatric:        Mood and Affect: Mood normal.        Behavior: Behavior normal.     Data Reviewed:  CBG stable  Family Communication: girlfriend at bedside  Disposition: Status is: Inpatient Remains inpatient appropriate because: XRT  Planned Discharge Destination: Skilled nursing facility    Time spent: 20 minutes  Author: Murray Hodgkins, MD 04/20/2022 8:26 PM  For on call review [email or web address].

## 2022-04-20 NOTE — Consult Note (Addendum)
WOC Nurse Consult Note: Reason for Consult: Consult requested for right inner groin, near  scrotum. Pt states the lesion and wound has been there "a couple of years."  He denies ever having a biopsy performed to this location or having a dermatologist or other physician assess the site. 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  Family member at the bedside states he will not let anyone look at the wound and has not told his physicians about the area, which has significant bleeding at times. Topical treatment will be minimally effective to promote healing.  Goals will be directed towards absorbing drainage and minimizing discomfort.  Wound type: Right inner groin with full thickness lesion; red, moist and painful to touch, 3X1X1cm, red raised bumps surrounding wound edges, mod amt tan drainage.  Dressing procedure/placement/frequency: Recommended patient see a dermatologist after discharge to obtain a biopsy and they could provide further plan of care. There are no dermatologists available at [facility] who see inpatients.  Topical treatment orders provided for the bedside nurses to perform as follows: Apply a piece of Aquacel Kellie Simmering # [phone]) to right groin, using swab to fill, then cover with foam dressing. Change foam dressing Q 3 days or PRN soiling) Hold foam dressing in place with mesh underwear when out of bed.  Avoid use of tape if possible. Please re-consult if further assistance is needed.  Thank-you,  Julien Girt MSN, [place], [place], [place], [place]

## 2022-04-20 NOTE — Progress Notes (Signed)
Occupational Therapy Treatment Patient Details Name: Gregory [name]. MRN: [id number] DOB: February 08, 1956 Today's Date: 04/20/2022   History of present illness 66 y.o. male presenting for left leg weakness that occurred when he went to stand up from bed.  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   OT comments  Patient and fiance instructed on UE HEP in supine. Patient provided with yellow band, HEP handouts. Patient performed each exercise x 10 reps. Recommended patient get a splint for left wrist as well to stabilize for exercises and using left arm on walker. Patient's fiance verbalized understanding. Patient still agreeable to rehab. Cont POC.   Recommendations for follow up therapy are one component of a multi-disciplinary discharge planning process, led by the attending physician.  Recommendations may be updated based on patient status, additional functional criteria and insurance authorization.    Follow Up Recommendations  Skilled nursing-short term rehab (<3 hours/day)    Assistance Recommended at Discharge    Patient can return home with the following  A lot of help with walking and/or transfers;A lot of help with bathing/dressing/bathroom;Assistance with cooking/housework;Help with stairs or ramp for entrance;Assist for transportation   Equipment Recommendations  Tub/shower bench    Recommendations for Other Services      Precautions / Restrictions Precautions Precautions: Fall Precaution Comments: 2 falls per month, LUE weak with shoulder tumor, LLE weakness Restrictions Weight Bearing Restrictions: No        ADL either performed or assessed with clinical judgement     Vision Patient Visual Report: No change from baseline     Perception     Praxis      Cognition Arousal/Alertness:  Awake/alert Behavior During Therapy: WFL for tasks assessed/performed Overall Cognitive Status: Within Functional Limits for tasks assessed                                          Exercises Other Exercises Other Exercises: Educated patient on UE HEP and handouts provided. Had patient perform 10 reps of bicep flexion, tricep extension, shoulder internal and external rotation with yellow band. Also had patient doe wrist extension reps. Recommended fiance purchase patient a wrist brace to stablize weak wrist for exercises.    Shoulder Instructions       General Comments      Pertinent Vitals/ Pain       Pain Assessment Pain Assessment: No/denies pain  Home Living                                          Prior Functioning/Environment              Frequency  Min 2X/week        Progress Toward Goals  OT Goals(current goals can now be found in the care plan section)  Progress towards OT goals: Progressing toward goals  Acute Rehab OT Goals Patient Stated Goal: to walk again OT Goal Formulation: With patient Time For Goal Achievement: 05/02/22 Potential to Achieve Goals: [place] Discharge plan remains appropriate    Co-evaluation          OT goals addressed during session:  Strengthening/ROM      AM-PAC OT "6 Clicks" Daily Activity     Outcome Measure   Help from another person eating meals?: A Little Help from another person taking care of personal grooming?: A Little Help from another person toileting, which includes using toliet, bedpan, or urinal?: Total Help from another person bathing (including washing, rinsing, drying)?: A Lot Help from another person to put on and taking off regular upper body clothing?: A Lot Help from another person to put on and taking off regular lower body clothing?: Total 6 Click Score: 12    End of Session    OT Visit Diagnosis: Hemiplegia and hemiparesis Hemiplegia - Right/Left:  Left Hemiplegia - dominant/non-dominant: Dominant Hemiplegia - caused by: Other cerebrovascular disease   Activity Tolerance Patient tolerated treatment well   Patient Left in bed;with call bell/phone within reach;with family/visitor present   Nurse Communication Mobility status        Time: 1120-1140 OT Time Calculation (min): 20 min  Charges: OT General Charges $OT Visit: 1 Visit OT Treatments $Therapeutic Exercise: 8-22 mins  Derl Barrow, OTR/L [place]  Office [phone] Pager: [place] 04/20/2022, 12:44 PM

## 2022-04-20 NOTE — Plan of Care (Signed)
  Problem: Clinical Measurements: Goal: Will remain free from infection Outcome: Progressing   Problem: Clinical Measurements: Goal: Diagnostic test results will improve Outcome: Progressing   Problem: Activity: Goal: Risk for activity intolerance will decrease Outcome: Progressing   Problem: Nutrition: Goal: Adequate nutrition will be maintained Outcome: Progressing   

## 2022-04-20 NOTE — Progress Notes (Signed)
Physical Therapy Treatment Patient Details Name: Gregory [name]. MRN: [id number] DOB: 09-May-1956 Today's Date: 04/20/2022   History of Present Illness 66 y.o. male presenting for left leg weakness that occurred when he went to stand up from bed.  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    PT Comments    Pt only tolerated OOB in recliner 30 min due to pain.  Had [facility] assist pt back to bed.  General transfer comment: Had Fiancee demonstarte how she assists pt from bed to recliner (would be a wheelchair at home).  She has pt throw B UE u and around her as she pulls near waist to perform sit to stand.  Once standing, pt is briefly able to support self with R LE but L LE buckles.  1/4 pivot turn, pt and Fiancee "dance around" [facility] with difficulty moving that L LE.  Then she assists to control stand to sit.  "he was able to help me more before all this" stated Fiance. "don't drop me" stated pt. General bed mobility comments: had Fiancee assist pt back to bed.  Required Max Asisst to support B LE up onto bed.  "Normally, he can do that much on his own", but know needs "more help".  Pt will need ST Rehab at SNF to address mobility and functional decline prior to safely returning home.   Recommendations for follow up therapy are one component of a multi-disciplinary discharge planning process, led by the attending physician.  Recommendations may be updated based on patient status, additional functional criteria and insurance authorization.  Follow Up Recommendations  Skilled nursing-short term rehab (<3 hours/day) Can patient physically be transported by private vehicle: Yes   Assistance Recommended at Discharge Frequent or constant Supervision/Assistance  Patient can return home with the following A lot of help with walking and/or  transfers;A lot of help with bathing/dressing/bathroom;Assistance with cooking/housework;Assist for transportation;Help with stairs or ramp for entrance   Equipment Recommendations  None recommended by PT    Recommendations for Other Services       Precautions / Restrictions Precautions Precautions: Fall Precaution Comments: 2 falls per month, LUE weak with shoulder tumor, LLE weakness, R foot drop Restrictions Weight Bearing Restrictions: No     Mobility  Bed Mobility Overal bed mobility: Needs Assistance Bed Mobility: Sit to Supine     Supine to sit: Mod assist Sit to supine: Max assist   General bed mobility comments: had Fiancee assist pt back to bed.  Required Max Asisst to support B LE up onto bed.  "Normally, he can do that much on his own", but know needs "more help".    Transfers Overall transfer level: Needs assistance Equipment used: None Transfers: Bed to chair/wheelchair/BSC Sit to Stand: Mod assist, +2 safety/equipment Stand pivot transfers: Mod assist, Max assist         General transfer comment: Had Celesta Gentile demonstarte how she assists pt from bed to recliner (would be a wheelchair at home).  She has pt throw B UE u and around her as she pulls near waist to perform sit to stand.  Once standing, pt is briefly able to support self with R LE but L LE buckles.  1/4 pivot turn, pt and Fiancee "dance around" [facility] with difficulty moving that L LE.  Then she assists to control  stand to sit.  "he was able to help me more before all this" stated Fiance. "don't drop me" stated pt.    Ambulation/Gait               General Gait Details: pt has been non amb "for some time" and uses a wheelchair at home   Stairs             Wheelchair Mobility    Modified Rankin (Stroke Patients Only)       Balance                                            Cognition Arousal/Alertness: Awake/alert Behavior During Therapy: WFL for tasks  assessed/performed Overall Cognitive Status: Within Functional Limits for tasks assessed                                 General Comments: AxO x 3 requires MAX encouragement.  Relies heavily on Fiancee to care for him.  "Been staying in bed alot lately".        Exercises      General Comments        Pertinent Vitals/Pain Pain Assessment Pain Assessment: Faces Faces Pain Scale: Hurts little more Pain Location: back Pain Descriptors / Indicators: Constant, Grimacing Pain Intervention(s): Monitored during session, Repositioned    Home Living                          Prior Function            PT Goals (current goals can now be found in the care plan section) Progress towards PT goals: Progressing toward goals    Frequency    Min 2X/week      PT Plan Current plan remains appropriate    Co-evaluation       OT goals addressed during session: Strengthening/ROM      AM-PAC PT "6 Clicks" Mobility   Outcome Measure  Help needed turning from your back to your side while in a flat bed without using bedrails?: A Lot Help needed moving from lying on your back to sitting on the side of a flat bed without using bedrails?: A Lot Help needed moving to and from a bed to a chair (including a wheelchair)?: A Lot Help needed standing up from a chair using your arms (e.g., wheelchair or bedside chair)?: A Lot Help needed to walk in hospital room?: Total Help needed climbing 3-5 steps with a railing? : Total 6 Click Score: 10    End of Session Equipment Utilized During Treatment: Gait belt Activity Tolerance: Patient limited by fatigue;Patient limited by pain Patient left: in bed Nurse Communication: Mobility status PT Visit Diagnosis: Hemiplegia and hemiparesis;History of falling (Z91.81);Muscle weakness (generalized) (M62.81);Other abnormalities of gait and mobility (R26.89) Hemiplegia - Right/Left: Left     Time: 1358-1410 PT Time Calculation  (min) (ACUTE ONLY): 12 min  Charges:  $Therapeutic Activity: 8-22 mins                     Rica Koyanagi  PTA Acute  Rehabilitation Services Office M-F          [phone] Weekend pager [phone]

## 2022-04-21 DIAGNOSIS — R29898 Other symptoms and signs involving the musculoskeletal system: Secondary | ICD-10-CM | POA: Diagnosis not present

## 2022-04-21 DIAGNOSIS — D497 Neoplasm of unspecified behavior of endocrine glands and other parts of nervous system: Secondary | ICD-10-CM | POA: Diagnosis not present

## 2022-04-21 DIAGNOSIS — C7951 Secondary malignant neoplasm of bone: Secondary | ICD-10-CM | POA: Diagnosis not present

## 2022-04-21 DIAGNOSIS — C649 Malignant neoplasm of unspecified kidney, except renal pelvis: Secondary | ICD-10-CM | POA: Diagnosis not present

## 2022-04-21 LAB — GLUCOSE, CAPILLARY: Glucose-Capillary: 99 mg/dL (ref 70–99)

## 2022-04-21 NOTE — Progress Notes (Signed)
  Progress Note   Patient: Gregory [name]. 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  After discussion with his oncologist, plan was made to transfer to [facility] for radiation therapy.  Plan to continue radiation therapy and discharge to SNF when able.  Assessment and Plan: Left leg weakness, acute on chronic secondary to metastatic renal cell carcinoma, metastatic disease to bones, lungs, liver. --Patient refused MRI secondary to severe claustrophobia. --Continue radiation therapy per radiation oncology, plan for completion this coming Tuesday.   Renal cell carcinoma ([place]) --Currently undergoing radiation and chemotherapy. Primary oncologist Dr. Delton Coombes and radiation oncologist Dr. 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  Epidural disease present L4. --Continue Decadron per Dr. Delton Coombes --Continue Radiation therapy -- Pain control   Port-A-Cath in place   Tobacco use disorder -- NicoDerm patch   HIV screen reactive -- Spurious.  Confirmatory testing negative.  No further evaluation suggested.     Subjective:  Right foot a little numb No other changes Reports right elbow better, doesn't think he needs more abx for it (was draining 8 days ago and was prescribed abx)  Physical Exam: Vitals:   04/20/22 2309 04/21/22 0705 04/21/22 0828 04/21/22 1431  BP: 94/70 101/70 107/71 112/76  Pulse: 73 78 76 72  Resp: '18 20 18 16  '$ Temp:  98.5 F (36.9 C) 98.3 F (36.8 C) 98.6 F (37 C) (!) 97.5 F (36.4 C)  TempSrc: Oral Oral Oral Oral  SpO2: 97% 99% 98% 100%  Weight:  59.3 kg    Height:       Physical Exam Vitals reviewed.  Constitutional:      General: He is not in acute distress.    Appearance: He is not ill-appearing or toxic-appearing.  Cardiovascular:     Rate and Rhythm: Normal rate and regular rhythm.     Heart sounds: No murmur heard. Pulmonary:     Effort: Pulmonary effort is normal. No respiratory distress.     Breath sounds: No wheezing, rhonchi or rales.  Abdominal:     General: There is distension.     Palpations: Abdomen is soft.     Tenderness: There is no abdominal tenderness.  Musculoskeletal:     Right lower leg: No edema.     Left lower leg: No edema.     Comments: Can slide left leg up bed Moves right leg to command Sensation to right foot grossly intact  Neurological:     Mental Status: He is alert.  Psychiatric:        Mood and Affect: Mood normal.        Behavior: Behavior normal.     Data Reviewed:  UOP 1150  Family Communication: girlfriend at bedsdie  Disposition: Status is: Inpatient Remains inpatient appropriate because: XRT  Planned Discharge Destination: Skilled nursing facility    Time spent: 20 minutes  Author: Murray Hodgkins, MD 04/21/2022 4:20 PM  For on call review [email or web address].

## 2022-04-22 DIAGNOSIS — D497 Neoplasm of unspecified behavior of endocrine glands and other parts of nervous system: Secondary | ICD-10-CM | POA: Diagnosis not present

## 2022-04-22 DIAGNOSIS — C7951 Secondary malignant neoplasm of bone: Secondary | ICD-10-CM | POA: Diagnosis not present

## 2022-04-22 DIAGNOSIS — C649 Malignant neoplasm of unspecified kidney, except renal pelvis: Secondary | ICD-10-CM | POA: Diagnosis not present

## 2022-04-22 LAB — GLUCOSE, CAPILLARY
Glucose-Capillary: 90 mg/dL (ref 70–99)
Glucose-Capillary: 101 mg/dL — ABNORMAL HIGH (ref 70–99)

## 2022-04-22 MED ORDER — NYSTATIN 100000 UNIT/ML MT SUSP
5.0000 mL | Freq: Four times a day (QID) | OROMUCOSAL | Status: DC
  Administered 2022-04-22 – 2022-05-10 (×45): 500000 [IU] via ORAL
  Filled 2022-04-22 (×48): qty 5

## 2022-04-22 NOTE — Progress Notes (Signed)
  Progress Note   Patient: Gregory [name]. 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  After discussion with his oncologist, plan was made to transfer to [facility] for radiation therapy.  Plan to continue radiation therapy and discharge to SNF when able.  Assessment and Plan: Left leg weakness, acute on chronic secondary to metastatic renal cell carcinoma, metastatic disease to bones, lungs, liver. --Patient refused MRI secondary to severe claustrophobia. --Continue radiation therapy per radiation oncology.   Renal cell carcinoma ([place]) --Currently undergoing radiation and chemotherapy. Primary oncologist Dr. Delton Coombes and radiation oncologist Dr. 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  Epidural disease present L4. --Continue Decadron per Dr. Delton Coombes --Continue Radiation therapy -- Pain control   Port-A-Cath in place   Tobacco use disorder -- NicoDerm patch   HIV screen reactive -- Suspect spurious.  Follow-up confirmatory testing.   Sore throat.  No apparent thrush on exam.  May be secondary To radiation.  Add empiric nystatin in case Candida.      Subjective:  Feels ok No new changes Does report some throat pain Poor oral intake Doesn't want to get up  Physical Exam: Vitals:   04/21/22 1431 04/21/22 2046 04/22/22 0611 04/22/22 1252  BP: 112/76 111/74 108/74 104/67  Pulse: 72 82 79 83  Resp: '16 16 16 16  '$ Temp: (!)  97.5 F (36.4 C) 97.6 F (36.4 C) 98.2 F (36.8 C) 98.1 F (36.7 C)  TempSrc: Oral Oral Oral Oral  SpO2: 100% 99% 100% 99%  Weight:   50.4 kg   Height:       Physical Exam Vitals reviewed.  Constitutional:      General: He is not in acute distress.    Appearance: He is not ill-appearing or toxic-appearing.  Cardiovascular:     Rate and Rhythm: Normal rate and regular rhythm.     Heart sounds: No murmur heard. Pulmonary:     Effort: Pulmonary effort is normal. No respiratory distress.     Breath sounds: No wheezing, rhonchi or rales.  Musculoskeletal:     Comments: Moves both legs  Neurological:     Mental Status: He is alert.  Psychiatric:        Mood and Affect: Mood normal.        Behavior: Behavior normal.     Data Reviewed:  CBG stable  Family Communication: daughter at bedside  Disposition: Status is: Inpatient Remains inpatient appropriate because: XRT  Planned Discharge Destination: Skilled nursing facility    Time spent: 15 minutes  Author: Murray Hodgkins, MD 04/22/2022 3:43 PM  For on call review [email or web address].

## 2022-04-23 ENCOUNTER — Ambulatory Visit: Payer: PPO

## 2022-04-23 ENCOUNTER — Ambulatory Visit
Admission: RE | Admit: 2022-04-23 | Discharge: 2022-04-23 | Disposition: A | Discharge status: Home / Self Care | Payer: PPO | Source: Ambulatory Visit | Attending: Radiation Oncology | Admitting: Radiation Oncology

## 2022-04-23 ENCOUNTER — Other Ambulatory Visit: Payer: Self-pay

## 2022-04-23 DIAGNOSIS — R29898 Other symptoms and signs involving the musculoskeletal system: Secondary | ICD-10-CM | POA: Diagnosis not present

## 2022-04-23 DIAGNOSIS — Z7189 Other specified counseling: Secondary | ICD-10-CM | POA: Diagnosis not present

## 2022-04-23 DIAGNOSIS — C799 Secondary malignant neoplasm of unspecified site: Secondary | ICD-10-CM

## 2022-04-23 DIAGNOSIS — C649 Malignant neoplasm of unspecified kidney, except renal pelvis: Secondary | ICD-10-CM | POA: Diagnosis not present

## 2022-04-23 DIAGNOSIS — Z8589 Personal history of malignant neoplasm of other organs and systems: Secondary | ICD-10-CM

## 2022-04-23 DIAGNOSIS — Z515 Encounter for palliative care: Secondary | ICD-10-CM | POA: Diagnosis not present

## 2022-04-23 DIAGNOSIS — Z95828 Presence of other vascular implants and grafts: Secondary | ICD-10-CM

## 2022-04-23 DIAGNOSIS — C7951 Secondary malignant neoplasm of bone: Secondary | ICD-10-CM | POA: Diagnosis not present

## 2022-04-23 DIAGNOSIS — M5416 Radiculopathy, lumbar region: Secondary | ICD-10-CM

## 2022-04-23 DIAGNOSIS — D497 Neoplasm of unspecified behavior of endocrine glands and other parts of nervous system: Secondary | ICD-10-CM | POA: Diagnosis not present

## 2022-04-23 LAB — RAD ONC ARIA SESSION SUMMARY
Course Elapsed Days: 14
Plan Fractions Treated to Date: 5
Plan Fractions Treated to Date: 5
Plan Fractions Treated to Date: 9
Plan Prescribed Dose Per Fraction: 10 Gy
Plan Prescribed Dose Per Fraction: 10 Gy
Plan Prescribed Dose Per Fraction: 3 Gy
Plan Total Fractions Prescribed: 10
Plan Total Fractions Prescribed: 5
Plan Total Fractions Prescribed: 5
Plan Total Prescribed Dose: 30 Gy
Plan Total Prescribed Dose: 50 Gy
Plan Total Prescribed Dose: 50 Gy
Reference Point Dosage Given to Date: 27 Gy
Reference Point Dosage Given to Date: 50 Gy
Reference Point Dosage Given to Date: 50 Gy
Reference Point Session Dosage Given: 10 Gy
Reference Point Session Dosage Given: 10 Gy
Reference Point Session Dosage Given: 3 Gy
Session Number: 10

## 2022-04-23 LAB — GLUCOSE, CAPILLARY: Glucose-Capillary: 157 mg/dL — ABNORMAL HIGH (ref 70–99)

## 2022-04-23 MED ORDER — PHENOL 1.4 % MT LIQD
1.0000 | OROMUCOSAL | Status: DC | PRN
  Filled 2022-04-23 (×2): qty 177

## 2022-04-23 NOTE — Progress Notes (Signed)
  Daily Progress Note   Patient Name: Gregory [name].       Date: 04/23/2022 DOB: 06-23-1956  Age: 66 y.o. MRN#: [id number] Attending Physician: Samuella Cota, MD Primary Care Physician: Pcp, No Admit Date: 04/17/2022 Length of Stay: 5 days  Reason for Consultation/Follow-up: {Reason for Consult:23484}  HPI/Patient Profile:  ***  Subjective:   Subjective: Chart Reviewed. Updates received. Patient Assessed. Created space and opportunity for patient  and family to explore thoughts and feelings regarding current medical situation.  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 PMT will continue to support holistically.  Time Total: ***  Visit consisted of counseling and education dealing with the complex and emotionally intense issues of symptom management and palliative care in the setting of serious and potentially life-threatening illness. Greater than 50%  of this time was spent counseling and coordinating care related to the above assessment and plan.  Walden Field, NP Palliative Medicine Team  Team Phone # [phone] (Nights/Weekends)  05/02/2021, 8:17 AM

## 2022-04-23 NOTE — TOC Progression Note (Addendum)
Transition of Care (TOC) - Progression Note    Patient Details  Name: Gregory [name]. MRN: [id number] Date of Birth: [birth year]  Transition of Care [facility]) CM/SW Contact  Gregory [name], [place] Phone Number: 04/23/2022, 2:52 PM  Clinical Narrative:     CSW spoke to patient and his significant other Gregory [name] regarding bed offers.  Patient and sig other were hoping for a SNF in [place], but unfortunately the SNFs do not accept his insurance.  CSW confirmed with Gregory [name], [name], and [name] that they do not take his insurance.  CSW spoke to patient, his significant other, and his daughter Gregory [name] to present bed offers.  Patient and his family chose [name] in [place].  CSW spoke to [name] they can accept pending insurance authorization.  CSW started insurance authorization with [employer] for SNF placement and EMS transport.  CSW explained the steps and what to expect at SNF for rehab.  Patient and his significant other said he just signed up for [employer] this month.  They asked if there is any way to switch insurance companies that have a better network, CSW informed them he would have to wait till open enrollment in October for a [employer] program, but if he contacts Medicare directly, he may be able to switch back to Medicare, but he would have to talk with them.  CSW was asked if patient would be eligible for Medicaid, and CSW informed him they would have to check with the Department of Social Services of the county he is living in and see if he would qualify.  CSW awaiting insurance authorization.  CSW to continue to follow patient throughout discharge planning.  Palliative is following patient and recommending palliative follow patient at SNF.  CSW called [name] and spoke to Gregory [name], she accepted the palliative at [facility] referral.  CSW updated Gregory [name] palliative team.

## 2022-04-23 NOTE — Plan of Care (Signed)
Pt reporting pain 4/10 this am.  Minimal tolerance of mobilization.  Poor appetite.  Continuing radiation therapy daily.  Potential d/c to SNF/rehab.  Problem: Activity: Goal: Risk for activity intolerance will decrease Outcome: Not Progressing   Problem: Nutrition: Goal: Adequate nutrition will be maintained Outcome: Not Progressing   Problem: Skin Integrity: Goal: Risk for impaired skin integrity will decrease Outcome: Not Progressing

## 2022-04-23 NOTE — Care Management Important Message (Signed)
Important Message  Patient Details IM Letter given to the Patient. Name: Gregory [name]. MRN: [id number] Date of Birth: [birth year]   Medicare Important Message Given:  Yes     Kerin Salen 04/23/2022, 10:09 AM

## 2022-04-23 NOTE — Progress Notes (Signed)
  Progress Note   Patient: Gregory [name]. 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  After discussion with his oncologist, plan was made to transfer to [facility] for radiation therapy.  Plan to continue radiation therapy and discharge to SNF when able.  Assessment and Plan: Left leg weakness, acute on chronic secondary to metastatic renal cell carcinoma, metastatic disease to bones, lungs, liver. --Patient refused MRI secondary to severe claustrophobia. --Continue radiation therapy per radiation oncology.  Appears stable.   Renal cell carcinoma ([place]) --Currently undergoing radiation and chemotherapy. Primary oncologist Dr. Delton Coombes and radiation oncologist Dr. 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  Epidural disease present L4. --Continue Decadron per Dr. Delton Coombes --Continue Radiation therapy -- Pain control   Port-A-Cath in place   Tobacco use disorder -- NicoDerm patch   HIV screen reactive -- Spurious.  Confirmatory testing negative.      Subjective:  Feels tired and some pain after PT Eating better No change in legs  Physical Exam: Vitals:   04/22/22 2030 04/23/22 0449 04/23/22 0500 04/23/22 1328  BP: 101/67 109/64  104/70  Pulse: 79 73  84  Resp: '14 16  19  '$ Temp: 98 F (36.7 C) 98 F (36.7 C)  97.8 F (36.6 C)  TempSrc: Oral Oral  Oral  SpO2: 99% 98%  97%  Weight:   48 kg   Height:       Physical  Exam Vitals reviewed.  Constitutional:      General: He is not in acute distress.    Appearance: He is not ill-appearing or toxic-appearing.  Cardiovascular:     Rate and Rhythm: Normal rate and regular rhythm.     Heart sounds: No murmur heard. Pulmonary:     Effort: Pulmonary effort is normal. No respiratory distress.     Breath sounds: No wheezing, rhonchi or rales.  Musculoskeletal:     Right lower leg: No edema.     Left lower leg: No edema.  Neurological:     Mental Status: He is alert.  Psychiatric:        Mood and Affect: Mood normal.        Behavior: Behavior normal.     Data Reviewed:  CBG stable  Family Communication: girlfriend at bedside  Disposition: Status is: Inpatient Remains inpatient appropriate because: inpatient XRT  Planned Discharge Destination: Skilled nursing facility    Time spent: 25 minutes  Author: Murray Hodgkins, MD 04/23/2022 3:56 PM  For on call review [email or web address].

## 2022-04-23 NOTE — Progress Notes (Signed)
Physical Therapy Treatment Patient Details Name: Gregory [name]. MRN: [id number] DOB: 1955-12-09 Today's Date: 04/23/2022   History of Present Illness 66 y.o. male presenting for left leg weakness that occurred when he went to stand up from bed.  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    PT Comments    Pt assisted with performing 6 sit to stands.  Utilized stedy to provide knee block as pt fearful of L LE buckling (which was not observed during session).  Pt pleased with being able to stand and not reliant on knee block today.  Continue to recommend SNF upon d/c.    Recommendations for follow up therapy are one component of a multi-disciplinary discharge planning process, led by the attending physician.  Recommendations may be updated based on patient status, additional functional criteria and insurance authorization.  Follow Up Recommendations  Skilled nursing-short term rehab (<3 hours/day) Can patient physically be transported by private vehicle: Yes   Assistance Recommended at Discharge Frequent or constant Supervision/Assistance  Patient can return home with the following A lot of help with walking and/or transfers;A lot of help with bathing/dressing/bathroom;Assistance with cooking/housework;Assist for transportation;Help with stairs or ramp for entrance   Equipment Recommendations  None recommended by PT    Recommendations for Other Services       Precautions / Restrictions Precautions Precautions: Fall Precaution Comments: 2 falls per month, LUE weak with shoulder tumor, LLE weakness, R foot drop Restrictions Weight Bearing Restrictions: No     Mobility  Bed Mobility Overal bed mobility: Needs Assistance Bed Mobility: Sit to Sidelying, Supine to Sit     Supine to sit: Min guard   Sit to sidelying:  Mod assist General bed mobility comments: pt utilized bed positioning and rail to self assist to EOB, required assist for LEs onto bed upon return to supine    Transfers Overall transfer level: Needs assistance   Transfers: Bed to chair/wheelchair/BSC Sit to Stand: Mod assist, +2 safety/equipment           General transfer comment: pt very anxious about L LE buckling so provided stedy today for pt to perform multiple sit to stands with more confidence/security; pt performed 6 sit to stands with stedy and able to hold standing approx 30-45 seconds    Ambulation/Gait                   Stairs             Wheelchair Mobility    Modified Rankin (Stroke Patients Only)       Balance           Standing balance support: Bilateral upper extremity supported, Reliant on assistive device for balance Standing balance-Leahy Scale: Poor                              Cognition Arousal/Alertness: Awake/alert Behavior During Therapy: WFL for tasks assessed/performed, Anxious Overall Cognitive Status: Within Functional Limits for tasks assessed                                          Exercises      General Comments  Pertinent Vitals/Pain Pain Assessment Pain Assessment: Faces Faces Pain Scale: Hurts little more Pain Location: back Pain Descriptors / Indicators: Constant, Grimacing Pain Intervention(s): Repositioned, Monitored during session    Home Living                          Prior Function            PT Goals (current goals can now be found in the care plan section) Progress towards PT goals: Progressing toward goals    Frequency    Min 2X/week      PT Plan Current plan remains appropriate    Co-evaluation              AM-PAC PT "6 Clicks" Mobility   Outcome Measure  Help needed turning from your back to your side while in a flat bed without using bedrails?: A Lot Help needed  moving from lying on your back to sitting on the side of a flat bed without using bedrails?: A Lot Help needed moving to and from a bed to a chair (including a wheelchair)?: A Lot Help needed standing up from a chair using your arms (e.g., wheelchair or bedside chair)?: A Lot Help needed to walk in hospital room?: Total Help needed climbing 3-5 steps with a railing? : Total 6 Click Score: 10    End of Session Equipment Utilized During Treatment: Gait belt Activity Tolerance: Patient limited by fatigue;Patient limited by pain Patient left: in bed;with call bell/phone within reach;with bed alarm set;with family/visitor present   PT Visit Diagnosis: History of falling (Z91.81);Muscle weakness (generalized) (M62.81)     Time: 3614-4315 PT Time Calculation (min) (ACUTE ONLY): 17 min  Charges:  $Therapeutic Activity: 8-22 mins                    Jannette Spanner PT, DPT Physical Therapist Acute Rehabilitation Services Preferred contact method: Secure Chat Weekend Pager Only: [phone] Office: [place] 04/23/2022, 11:33 AM

## 2022-04-24 ENCOUNTER — Encounter (HOSPITAL_COMMUNITY): Payer: Self-pay | Admitting: Hematology

## 2022-04-24 ENCOUNTER — Encounter: Payer: Self-pay | Admitting: Urology

## 2022-04-24 ENCOUNTER — Ambulatory Visit
Admission: RE | Admit: 2022-04-24 | Discharge: 2022-04-24 | Disposition: A | Discharge status: Home / Self Care | Payer: PPO | Source: Ambulatory Visit | Attending: Radiation Oncology | Admitting: Radiation Oncology

## 2022-04-24 ENCOUNTER — Other Ambulatory Visit: Payer: Self-pay

## 2022-04-24 ENCOUNTER — Encounter: Payer: Self-pay | Admitting: Hematology

## 2022-04-24 DIAGNOSIS — R29898 Other symptoms and signs involving the musculoskeletal system: Secondary | ICD-10-CM | POA: Diagnosis not present

## 2022-04-24 DIAGNOSIS — C649 Malignant neoplasm of unspecified kidney, except renal pelvis: Secondary | ICD-10-CM | POA: Diagnosis not present

## 2022-04-24 DIAGNOSIS — C7951 Secondary malignant neoplasm of bone: Secondary | ICD-10-CM | POA: Diagnosis not present

## 2022-04-24 DIAGNOSIS — D497 Neoplasm of unspecified behavior of endocrine glands and other parts of nervous system: Secondary | ICD-10-CM | POA: Diagnosis not present

## 2022-04-24 LAB — RAD ONC ARIA SESSION SUMMARY
Course Elapsed Days: 15
Plan Fractions Treated to Date: 10
Plan Prescribed Dose Per Fraction: 3 Gy
Plan Total Fractions Prescribed: 10
Plan Total Prescribed Dose: 30 Gy
Reference Point Dosage Given to Date: 30 Gy
Reference Point Session Dosage Given: 3 Gy
Session Number: 11

## 2022-04-24 NOTE — Progress Notes (Signed)
  Progress Note   Patient: Gregory [name]. 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  After discussion with his oncologist, plan was made to transfer to [facility] for radiation therapy.  Plan to continue radiation therapy and discharge to SNF when authorization obtained.  He has completed radiation and is ready for discharge  Assessment and Plan: Left leg weakness, acute on chronic secondary to metastatic renal cell carcinoma, metastatic disease to bones, lungs, liver. --Patient refused MRI secondary to severe claustrophobia. -- Completed radiation therapy today.   Renal cell carcinoma ([place]) --Currently undergoing radiation and chemotherapy. Primary oncologist Dr. Delton Coombes and radiation oncologist Dr. Tammi Klippel --Dr. Raliegh Ip has further studies planned in the next week.  Outpatient follow-up with Dr. Raliegh Ip.   Extensive metastatic disease throughout the cervical spine with pathologic fracture and osseous retropulsion at C6 with epidural paraspinal tumor causing cord compression.  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  Epidural disease present L4. --Continue Decadron per Dr. Delton Coombes -- Pain control   Port-A-Cath in place   Tobacco use disorder -- NicoDerm patch   HIV screen reactive -- Spurious.  Confirmatory testing negative.      Subjective:  Feels better today Less pain Eating better  Physical Exam: Vitals:   04/24/22 0500 04/24/22 0651 04/24/22 0859 04/24/22 1349  BP:  112/81 105/82 113/71  Pulse:  78 84 78  Resp:  '17 18 15  '$ Temp:  97.8 F (36.6 C) 98 F (36.7 C) 98.1 F (36.7 C)  TempSrc:  Oral Oral Oral   SpO2:  99% 99% 99%  Weight: 48 kg     Height:       Physical Exam Vitals reviewed.  Constitutional:      General: He is not in acute distress.    Appearance: He is not ill-appearing or toxic-appearing.  Cardiovascular:     Rate and Rhythm: Normal rate and regular rhythm.     Heart sounds: No murmur heard. Pulmonary:     Effort: Pulmonary effort is normal. No respiratory distress.     Breath sounds: No wheezing, rhonchi or rales.  Musculoskeletal:     Comments: Moves both legs, no apparent change  Neurological:     Mental Status: He is alert.  Psychiatric:        Mood and Affect: Mood normal.        Behavior: Behavior normal.     Data Reviewed:  There are no new results to review at this time.  Family Communication: girlfriend at bedside  Disposition: Status is: Inpatient Remains inpatient appropriate because: awaiting SNF  Planned Discharge Destination: Skilled nursing facility    Time spent: 20 minutes  Author: Murray Hodgkins, MD 04/24/2022 5:22 PM  For on call review [email or web address].

## 2022-04-24 NOTE — TOC Progression Note (Addendum)
Transition of Care (TOC) - Progression Note    Patient Details  Name: Gregory [name]. MRN: [id number] Date of Birth: July 08, 1956  Transition of Care [facility]) CM/SW [place], LCSW Phone Number: 04/24/2022, 9:57 AM  Clinical Narrative:     Insurance auth still pending.   [place] 11:20am CSW spoke with pt's daughter and provided an update on pt's pending insurance auth.   Expected Discharge Plan: [place] Barriers to Discharge: Continued Medical Work up, SNF Pending bed offer, Ship broker  Expected Discharge Plan and Services Expected Discharge Plan: [place] arrangements for the past 2 months: Single Family Home                                       Social Determinants of Health (SDOH) Interventions    Readmission Risk Interventions     No data to display

## 2022-04-24 NOTE — Plan of Care (Signed)
Pt reporting pain 7/10 this am.  PRNs administered per MAR.  Left foot and lower leg cool to the touch and pedal pulse not palpable.  Pt able to move foot and reports no loss of sensation.  Problem: Activity: Goal: Risk for activity intolerance will decrease Outcome: Not Progressing   Problem: Nutrition: Goal: Adequate nutrition will be maintained Outcome: Not Progressing

## 2022-04-25 DIAGNOSIS — D497 Neoplasm of unspecified behavior of endocrine glands and other parts of nervous system: Secondary | ICD-10-CM | POA: Diagnosis not present

## 2022-04-25 MED ORDER — LIDOCAINE-PRILOCAINE 2.5-2.5 % EX CREA
TOPICAL_CREAM | Freq: Once | CUTANEOUS | Status: AC
  Filled 2022-04-25: qty 5

## 2022-04-25 NOTE — TOC Progression Note (Signed)
Transition of Care (TOC) - Progression Note    Patient Details  Name: Gregory [name]. MRN: [id number] Date of Birth: July 11, 1956  Transition of Care [facility]) CM/SW [place], LCSW Phone Number: 04/25/2022, 10:01 AM  Clinical Narrative:     CSW received call from [facility] , in regards to pt's Auth. Pt's Josem Kaufmann has moved to Peer to Peer with DR Marco Collie [phone]. Peer to Peer needs to be completed by 2pm today. MD made aware.    Expected Discharge Plan: [place] Barriers to Discharge: Continued Medical Work up, SNF Pending bed offer, Ship broker  Expected Discharge Plan and Services Expected Discharge Plan: [place] arrangements for the past 2 months: Single Family Home                                       Social Determinants of Health (SDOH) Interventions    Readmission Risk Interventions     No data to display

## 2022-04-25 NOTE — Progress Notes (Signed)
Oncology Discharge Planning Note  [place] [facility] Address: [address], [place] [address] Hours of Operation:  8am - 5pm, Monday - Friday  Clinic Contact Information:  [phone]  Oncology Care Team: Medical Oncologist:  Derek Jack  Patient Details: Name:  Gregory [name], Gregory [name] MRN:   [id number] DOB:   1956/07/02 Reason for Current Admission: Intradural extramedullary spinal tumor  Discharge Planning Narrative: Discharge follow-up appointments for oncology are current and available on the AVS and MyChart.   Upon discharge from the hospital, hematology/oncology's post discharge plan of care for the outpatient setting is: 05/01/22 @ 3:30 for hospital follow up with Dr. Delton Coombes.   Gregory [name] will be called within two business days after discharge to review hematology/oncology's plan of care for full understanding.    Outpatient Oncology Specific Care Only: Oncology appointment transportation needs addressed?:  not applicable Oncology medication management for symptom management addressed?:  not applicable Chemo Alert Card reviewed?:  not applicable Immunotherapy Alert Card reviewed?:  not applicable

## 2022-04-25 NOTE — Progress Notes (Addendum)
PROGRESS NOTE   Gregory [name].  TOI:[id number]    DOB: 1956-01-27    DOA: 04/17/2022  PCP: Pcp, No   I have briefly reviewed patients previous medical records in [facility]  Chief Complaint  Patient presents with   Leg Pain    Brief Narrative:  66 year old man PMH metastatic renal cell cancer with widespread bony metastasis to the spine, currently undergoing radiation therapy for known epidural tumor, undergoing chemotherapy, presented with worsening left leg weakness to any pain.  After discussion with his oncologist, plan was made to transfer to [facility] for radiation therapy.  Patient completed XRT and is medically optimized for DC to SNF but insurance denied SNF admission, did a peer to peer review with Dr. Marco Collie of [facility] on 8/23, recommends PT to reevaluate and they will reassess case in a.m.     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 --Patient refused MRI secondary to severe claustrophobia. -- Completed radiation therapy 8/22 - Patient completed XRT and is medically optimized for DC to SNF but insurance denied SNF admission, did a peer to peer review with Dr. Marco Collie of [facility] on 8/23, recommends PT to reevaluate and they will reassess case in a.m.   Renal cell carcinoma ([place]) --Currently undergoing radiation and chemotherapy. Primary oncologist Dr. Delton Coombes and radiation oncologist Dr. Tammi Klippel --Dr. Raliegh Ip has further studies planned in the next week.  Outpatient follow-up with Dr. Raliegh Ip.   Extensive metastatic disease throughout  the cervical spine with pathologic fracture and osseous retropulsion at C6 with epidural paraspinal tumor causing cord compression.  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  Epidural disease present L4. --Continue Decadron per Dr. Delton Coombes -- Pain control  Anemia Suspect chronic disease.  Follow CBC in AM.   Port-A-Cath in place   Tobacco use disorder -- NicoDerm patch   HIV screen reactive -- Spurious.  Confirmatory testing negative.  Body mass index is 16.57 kg/m.    DVT prophylaxis: heparin injection 5,000 Units Start: 04/17/22 1400 TED hose Start: 04/17/22 1336 SCDs Start: 04/17/22 1336     Code Status: Full Code:  Family Communication: Daughter via phone, updated care and answered all questions.  Requested her to counsel her father to participate in therapies evaluation. Disposition:  Status is: Inpatient Remains inpatient appropriate because: Pending insurance authorization by SNF.     Consultants:   Palliative care medicine  Procedures:   Completed XRT.  Antimicrobials:      Subjective:  Patient reports ongoing left lower extremity weakness.  He indicates that PTA, he lived with his girlfriend.  Reports being independent until 4 weeks PTA then had progressive left lower extremity weakness, started ambulating with a cane or a walker, sustained a fall and eventually had to use wheelchair.  No pain reported.  Objective:   Vitals:   04/24/22 1349 04/24/22 2122 04/25/22 0448 04/25/22 1304  BP: 113/71 110/67 112/72 100/71  Pulse: 78 82 78 83  Resp: '15 18 18 18  '$ Temp: 98.1 F (36.7 C) 98.2 F (  36.8 C) 98.5 F (36.9 C) 98.7 F (37.1 C)  TempSrc: Oral Oral Oral Oral  SpO2: 99% 100% 100% 98%  Weight:      Height:        General exam: Middle-age male, small built and thinly nourished, lying comfortably propped up in bed without distress. Respiratory system: Clear to auscultation. Respiratory effort  normal. Cardiovascular system: S1 & S2 heard, RRR. No JVD, murmurs, rubs, gallops or clicks. No pedal edema.  Not on telemetry. Gastrointestinal system: Abdomen is nondistended, soft and nontender. No organomegaly or masses felt. Normal bowel sounds heard. Central nervous system: Alert and oriented. No focal neurological deficits. Extremities: Right upper extremity and 4+ by 5 power.  Left upper extremity distally grade 4+ by 5 power, unable to elevate left shoulder against gravity and states that he had a "tumor" in that shoulder.  Right lower extremity with grade 4 x 5 power in left lower extremity with grade 2 x 5 power. Skin: No rashes, lesions or ulcers Psychiatry: Judgement and insight appear normal. Mood & affect appropriate.     Data Reviewed:   I have personally reviewed following labs and imaging studies   CBC: No results for input(s): "WBC", "NEUTROABS", "HGB", "HCT", "MCV", "PLT" in the last 168 hours.  Basic Metabolic Panel: No results for input(s): "NA", "K", "CL", "CO2", "GLUCOSE", "BUN", "CREATININE", "CALCIUM", "MG", "PHOS" in the last 168 hours.  Liver Function Tests: No results for input(s): "AST", "ALT", "ALKPHOS", "BILITOT", "PROT", "ALBUMIN" in the last 168 hours.  CBG: Recent Labs  Lab 04/22/22 0757 04/22/22 2128 04/23/22 0758  GLUCAP 90 101* 157*    Microbiology Studies:  No results found for this or any previous visit (from the past 240 hour(s)).  Radiology Studies:  No results found.  Scheduled Meds:    Chlorhexidine Gluconate Cloth  6 each Topical Daily   dexamethasone  4 mg Oral Q12H   feeding supplement  237 mL Oral BID BM   gabapentin  100 mg Oral TID   heparin  5,000 Units Subcutaneous Q8H   nicotine  14 mg Transdermal Daily   nystatin  5 mL Oral QID   senna-docusate  1 tablet Oral BID   sodium chloride flush  3 mL Intravenous Q12H    Continuous Infusions:     LOS: 7 days     Vernell Leep, MD,  FACP, [facility], [facility], [facility] (Care  Management Physician Certified) [place]  To contact the attending provider between 7A-7P or the covering provider during after hours 7P-7A, please log into the web site [email or web address] and access using universal [place] password for that web site. If you do not have the password, please call the hospital operator.  04/25/2022, 5:48 PM

## 2022-04-25 NOTE — Progress Notes (Signed)
Occupational Therapy Treatment Patient Details Name: Gregory [name]. MRN: [id number] DOB: 08/29/1956 Today's Date: 04/25/2022   History of present illness 66 y.o. male presenting for left leg weakness that occurred when he went to stand up from bed.  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   OT comments  Pt seen for OT treatment session, pt declined ADL's stating that he had already performed earlier and he also declined transfer training this date. He was agreeable to performance of HEP for bilateral UE's using yellow theraband at bed level. He was assisted with repositioning in bed w/ +2 assist after performing HEP. Yellow theraband was left in room for pt use.   Recommendations for follow up therapy are one component of a multi-disciplinary discharge planning process, led by the attending physician.  Recommendations may be updated based on patient status, additional functional criteria and insurance authorization.    Follow Up Recommendations  Skilled nursing-short term rehab (<3 hours/day)    Assistance Recommended at Discharge Frequent or constant Supervision/Assistance  Patient can return home with the following  A lot of help with walking and/or transfers;A lot of help with bathing/dressing/bathroom;Assistance with cooking/housework;Help with stairs or ramp for entrance;Assist for transportation   Equipment Recommendations  Tub/shower bench    Recommendations for Other Services      Precautions / Restrictions Precautions Precautions: Fall Precaution Comments: 2 falls per month, LUE weak with shoulder tumor, LLE weakness, R foot drop       Mobility Bed Mobility Overal bed mobility: Needs Assistance Bed Mobility:  (Repositioned in bed per pt request)     General bed mobility comments: Pt was +2 assist to scoot  up in bed and reposition    Transfers  Pt declined    Balance  Pt declined     ADL either performed or assessed with clinical judgement   ADL Overall ADL's : Needs assistance/impaired     General ADL Comments: Pt seen for OT treatment session, pt declined ADL's stating that he had already performed earlier and he also declined transfer training this date. He was agreeable to performance of HEP for bilateral UE's using yellow theraband at bed level.    Extremity/Trunk Assessment Upper Extremity Assessment Upper Extremity Assessment: Defer to OT evaluation;LUE deficits/detail RUE Deficits / Details: WFL ROM, grossly 4-/5 strength in shoulder and elbow, wrist 5/5, grip 3+/5 RUE Sensation: WNL RUE Coordination: WNL LUE Deficits / Details: 2+/5 shoulder, elbow 3/5, wrist 3/5, grip 3/5 LUE Sensation:  (Reports numbness to thumb) LUE Coordination: WNL      Vision Patient Visual Report: No change from baseline            Cognition Arousal/Alertness: Awake/alert Behavior During Therapy: WFL for tasks assessed/performed, Anxious Overall Cognitive Status: Within Functional Limits for tasks assessed        Exercises General Exercises - Upper Extremity Shoulder Extension: Strengthening, Both, 10 reps, Supine, Theraband Theraband Level (Shoulder Extension): Level 1 (Yellow) Shoulder Horizontal ABduction: Strengthening, Right, 10 reps, Supine, Theraband Theraband Level (Shoulder Horizontal Abduction): Level 1 (Yellow) Shoulder Horizontal ADduction: Strengthening, Right, 10 reps, Supine, Theraband Theraband Level (Shoulder Horizontal Adduction): Level 1 (Yellow) Elbow Flexion: Strengthening, Both, 10 reps, Supine, Theraband Theraband Level (Elbow Flexion): Level 1 (Yellow) Elbow Extension: Strengthening, Both, 10 reps, Theraband, Supine Theraband Level (Elbow Extension): Level 1 (Yellow) Shoulder Exercises  Shoulder External Rotation: Both, 10 reps, Theraband, Supine Theraband  Level (Shoulder External Rotation): Level 1 (Yellow)       General Comments  Pt assisted with repositioning in bed +2 assist    Pertinent Vitals/ Pain       Pain Assessment Pain Assessment: Faces Faces Pain Scale: Hurts little more Pain Location: back Pain Descriptors / Indicators: Constant, Grimacing Pain Intervention(s): Monitored during session, Repositioned  Home Living  Refer to OT initial eval for details        Prior Functioning/Environment   Refer to OT initial Eval for details   Frequency  Min 2X/week        Progress Toward Goals  OT Goals(current goals can now be found in the care plan section)  Progress towards OT goals: Progressing toward goals  Acute Rehab OT Goals Patient Stated Goal: to walk again OT Goal Formulation: With patient Time For Goal Achievement: 05/02/22 Potential to Achieve Goals: [place] Discharge plan remains appropriate       AM-PAC OT "6 Clicks" Daily Activity     Outcome Measure   Help from another person eating meals?: A Little Help from another person taking care of personal grooming?: A Little Help from another person toileting, which includes using toliet, bedpan, or urinal?: Total Help from another person bathing (including washing, rinsing, drying)?: A Lot Help from another person to put on and taking off regular upper body clothing?: A Lot Help from another person to put on and taking off regular lower body clothing?: Total 6 Click Score: 12    End of Session Equipment Utilized During Treatment: Other (comment) (Yellow theraband)  OT Visit Diagnosis: Hemiplegia and hemiparesis Hemiplegia - Right/Left: Left Hemiplegia - dominant/non-dominant: Dominant Hemiplegia - caused by: Other cerebrovascular disease   Activity Tolerance Patient tolerated treatment well   Patient Left in bed;with call bell/phone within reach;with nursing/sitter in room   Nurse Communication  RN staff assisted with repositioning pt in bed and  was in room giving medications at end of visit.        Time: 6553-7482 OT Time Calculation (min): 14 min  Charges: OT Treatments $Therapeutic Exercise: 8-22 mins   Almyra Deforest, OTR/L 04/25/2022, 10:29 AM

## 2022-04-25 NOTE — Progress Notes (Signed)
PT Cancellation Note  Patient Details Name: Gregory [name]. MRN: [id number] DOB: 1956-02-20   Cancelled Treatment:     attempted twice to see today.  AM pt worked with OT and c/o increased pain/fatigue requesting to rest.  PM "I just don't feel right today".  Pt has been evaluated with rec for SNF.  Will continue to follow and attempt to see another day.   Rica Koyanagi  PTA Acute  Rehabilitation Services Office M-F          [phone] Weekend pager [phone]

## 2022-04-26 ENCOUNTER — Inpatient Hospital Stay (HOSPITAL_COMMUNITY): Payer: PPO

## 2022-04-26 ENCOUNTER — Other Ambulatory Visit (HOSPITAL_COMMUNITY): Payer: PPO

## 2022-04-26 DIAGNOSIS — D497 Neoplasm of unspecified behavior of endocrine glands and other parts of nervous system: Secondary | ICD-10-CM | POA: Diagnosis not present

## 2022-04-26 LAB — BASIC METABOLIC PANEL
Anion gap: 8 (ref 5–15)
BUN: 24 mg/dL — ABNORMAL HIGH (ref 8–23)
CO2: 24 mmol/L (ref 22–32)
Calcium: 8.6 mg/dL — ABNORMAL LOW (ref 8.9–10.3)
Chloride: 104 mmol/L (ref 98–111)
Creatinine, Ser: 0.69 mg/dL (ref 0.61–1.24)
GFR, Estimated: >60 mL/min (ref >60)
Glucose, Bld: 99 mg/dL (ref 70–99)
Potassium: 4.5 mmol/L (ref 3.5–5.1)
Sodium: 136 mmol/L (ref 135–145)

## 2022-04-26 LAB — CBC
HCT: 39.8 % (ref 39.0–52.0)
Hemoglobin: 12.7 g/dL — ABNORMAL LOW (ref 13.0–17.0)
MCH: 26.7 pg (ref 26.0–34.0)
MCHC: 31.9 g/dL (ref 30.0–36.0)
MCV: 83.6 fL (ref 80.0–100.0)
Platelets: 217 10*3/uL (ref 150–400)
RBC: 4.76 MIL/uL (ref 4.22–5.81)
RDW: 17.9 % — ABNORMAL HIGH (ref 11.5–15.5)
WBC: 8.2 10*3/uL (ref 4.0–10.5)
nRBC: 0 % (ref 0.0–0.2)

## 2022-04-26 LAB — GLUCOSE, CAPILLARY: Glucose-Capillary: 94 mg/dL (ref 70–99)

## 2022-04-26 MED ORDER — TECHNETIUM TC 99M MEDRONATE IV KIT
20.0000 | PACK | Freq: Once | INTRAVENOUS | Status: AC | PRN
  Administered 2022-04-26: 19.2 via INTRAVENOUS

## 2022-04-26 NOTE — Progress Notes (Signed)
PROGRESS NOTE   Gregory [name].  HDQ:[id number]    DOB: [birth year]    DOA: 04/17/2022  PCP: Pcp, No   I have briefly reviewed patients previous medical records in [facility].  Chief Complaint  Patient presents with   Leg Pain    Brief Narrative:  66 year old man PMH metastatic renal cell cancer with widespread bony metastasis to the spine, currently undergoing radiation therapy for known epidural tumor, undergoing chemotherapy, presented with worsening left leg weakness to any pain.  After discussion with his oncologist, plan was made to transfer to [facility] for radiation therapy.  Patient completed XRT and is medically optimized for DC to SNF but insurance denied SNF admission even after completing peer to peer review with Psychologist, occupational.  Patient/family considering appealing the denial.     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 --Patient refused MRI secondary to severe claustrophobia. -- Completed radiation therapy 8/22 - Patient completed XRT and is medically optimized for DC to SNF but insurance denied SNF admission even after completing peer to peer review with Psychologist, occupational.  Patient/family considering appealing the denial.   Renal cell carcinoma ([place]) --Currently undergoing radiation and chemotherapy. Primary oncologist Dr. Delton Coombes and radiation oncologist Dr. Tammi Klippel --Dr. Raliegh Ip has further studies planned in the next week.  Outpatient follow-up with Dr. Raliegh Ip. -Patient was scheduled for an outpatient nuclear  medicine whole-body scan on 4/24 and as per discussion with patient, family and Dr. Raliegh Ip, this was ordered in-house and shows progressive osseous metastatic disease since 01/30/2022.   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  Epidural disease present L4. --Continue Decadron per Dr. Delton Coombes -- Pain control does not seem to be an issue. NM whole-body bone scan 8/24 with progressive osseous metastatic disease since 01/30/2022.  Anemia Suspect chronic disease.  Hemoglobin up to 12.7.   Port-A-Cath in place   Tobacco use disorder -- NicoDerm patch   HIV screen reactive -- Spurious.  Confirmatory testing negative.  Body mass index is 16.57 kg/m./Underweight    DVT prophylaxis: heparin injection 5,000 Units Start: 04/17/22 1400 TED hose Start: 04/17/22 1336 SCDs Start: 04/17/22 1336     Code Status: Full Code:  Family Communication: Girlfriend at bedside. Disposition:  Status is: Inpatient Remains inpatient appropriate because: Pending insurance authorization by SNF.     Consultants:   Palliative care medicine  Procedures:   Completed XRT.  Antimicrobials:      Subjective:  No new complaints.  Informed patient and girlfriend at bedside that insurance has denied his SNF admission even after a peer to peer review.  They plan to appeal.  Objective:   Vitals:   04/25/22 1304 04/25/22 2033 04/26/22 0452 04/26/22 1342  BP: 100/71 105/80 133/74 115/82  Pulse: 83 78 84 91  Resp: '18 18 18 18  '$ Temp: 98.7 F (37.1 C) 98.5 F (36.9  C) 99.4 F (37.4 C) 97.7 F (36.5 C)  TempSrc: Oral Oral Oral Oral  SpO2: 98% 100% 96% 99%  Weight:      Height:       Stable without new findings compared to yesterday. General exam: Middle-age male, small  built and thinly nourished, lying comfortably propped up in bed without distress. Respiratory system: Clear to auscultation. Respiratory effort normal. Cardiovascular system: S1 & S2 heard, RRR. No JVD, murmurs, rubs, gallops or clicks. No pedal edema.  Not on telemetry. Gastrointestinal system: Abdomen is nondistended, soft and nontender. No organomegaly or masses felt. Normal bowel sounds heard. Central nervous system: Alert and oriented. No focal neurological deficits. Extremities: Right upper extremity and 4+ by 5 power.  Left upper extremity distally grade 4+ by 5 power, unable to elevate left shoulder against gravity and states that he had a "tumor" in that shoulder.  Right lower extremity with grade 4 x 5 power in left lower extremity with grade 2 x 5 power. Skin: No rashes, lesions or ulcers Psychiatry: Judgement and insight appear normal. Mood & affect appropriate.     Data Reviewed:   I have personally reviewed following labs and imaging studies   CBC: Recent Labs  Lab 04/26/22 0733  WBC 8.2  HGB 12.7*  HCT 39.8  MCV 83.6  PLT 440    Basic Metabolic Panel: Recent Labs  Lab 04/26/22 0733  NA 136  K 4.5  CL 104  CO2 24  GLUCOSE 99  BUN 24*  CREATININE 0.69  CALCIUM 8.6*    Liver Function Tests: No results for input(s): "AST", "ALT", "ALKPHOS", "BILITOT", "PROT", "ALBUMIN" in the last 168 hours.  CBG: Recent Labs  Lab 04/22/22 2128 04/23/22 0758 04/26/22 0740  GLUCAP 101* 157* 94    Microbiology Studies:  No results found for this or any previous visit (from the past 240 hour(s)).  Radiology Studies:  NM Bone Scan Whole Body  Result Date: 04/26/2022 CLINICAL DATA:  Metastatic disease evaluation, history of renal cell carcinoma EXAM: NUCLEAR MEDICINE WHOLE BODY BONE SCAN TECHNIQUE: Whole body anterior and posterior images were obtained approximately 3 hours after intravenous injection of radiopharmaceutical. RADIOPHARMACEUTICALS:  19.2 mCi  Technetium-68mMDP IV COMPARISON:  01/30/2022 Correlation: MRI cervical spine, thoracic spine, and lumbar spine from July in August of 2023 FINDINGS: Numerous sites of abnormal osseous tracer uptake are seen consistent with widespread osseous metastatic disease, increased in number since previous study. These include BILATERAL ribs, skull base, sternum, RIGHT humerus, spine, pelvis, and BILATERAL proximal femora. Multiple sites uptake in ribs, spine, RIGHT humerus, pelvis, and femora are new since prior exam. Expected urinary tract and soft tissue distribution of tracer. IMPRESSION: Progressive osseous metastatic disease since 01/30/2022 Electronically Signed   By: MLavonia DanaM.D.   On: 04/26/2022 13:59    Scheduled Meds:    Chlorhexidine Gluconate Cloth  6 each Topical Daily   dexamethasone  4 mg Oral Q12H   feeding supplement  237 mL Oral BID BM   gabapentin  100 mg Oral TID   heparin  5,000 Units Subcutaneous Q8H   nicotine  14 mg Transdermal Daily   nystatin  5 mL Oral QID   senna-docusate  1 tablet Oral BID   sodium chloride flush  3 mL Intravenous Q12H    Continuous Infusions:     LOS: 8 days     AVernell Leep MD,  FACP, [facility](Care Management Physician Certified) [place]  To contact the attending provider between 7A-7P or the covering provider during after hours 7P-7A, please log into the web site [email or web address] and access using universal [place] password for that web site. If you do not have the password, please call the hospital operator.  04/26/2022, 6:05 PM

## 2022-04-26 NOTE — TOC Progression Note (Signed)
Transition of Care (TOC) - Progression Note    Patient Details  Name: Gregory [name]. MRN: [id number] Date of Birth: 19-Mar-1956  Transition of Care [facility]) CM/SW [place], LCSW Phone Number: 04/26/2022, 9:45 AM  Clinical Narrative:    CSW received a call from [employer] in regard to pt's insurance authorization, Pt's auth for EMS services was approved Auth ID [id number], SNF placement auth was denied.  CSW provided the pt and family with [employer] denial letter and informed them of their right to appeal the decision with [employer], by following the steps in the letter. The pt and family verbalized understanding.   Per the previous note, pt has no DME needs at this time. The family is requesting EMS transport home at the time of d/c.  Expected Discharge Plan: [place] Barriers to Discharge: Continued Medical Work up, SNF Pending bed offer, Ship broker  Expected Discharge Plan and Services Expected Discharge Plan: [place] arrangements for the past 2 months: Single Family Home                                       Social Determinants of Health (SDOH) Interventions    Readmission Risk Interventions     No data to display

## 2022-04-27 DIAGNOSIS — D497 Neoplasm of unspecified behavior of endocrine glands and other parts of nervous system: Secondary | ICD-10-CM | POA: Diagnosis not present

## 2022-04-27 LAB — GLUCOSE, CAPILLARY: Glucose-Capillary: 114 mg/dL — ABNORMAL HIGH (ref 70–99)

## 2022-04-27 NOTE — Plan of Care (Signed)
  Problem: Education: Goal: Knowledge of General Education information will improve Description: Including pain rating scale, medication(s)/side effects and non-pharmacologic comfort measures Outcome: Progressing   Problem: Health Behavior/Discharge Planning: Goal: Ability to manage health-related needs will improve Outcome: Progressing   Problem: Clinical Measurements: Goal: Ability to maintain clinical measurements within normal limits will improve Outcome: Progressing Goal: Will remain free from infection Outcome: Progressing Goal: Diagnostic test results will improve Outcome: Progressing Goal: Respiratory complications will improve Outcome: Progressing Goal: Cardiovascular complication will be avoided Outcome: Progressing   Problem: Activity: Goal: Risk for activity intolerance will decrease Outcome: Progressing   Problem: Coping: Goal: Level of anxiety will decrease Outcome: Progressing   Problem: Elimination: Goal: Will not experience complications related to bowel motility Outcome: Progressing Goal: Will not experience complications related to urinary retention Outcome: Progressing   Problem: Pain Managment: Goal: General experience of comfort will improve Outcome: Progressing   Problem: Safety: Goal: Ability to remain free from injury will improve Outcome: Progressing   

## 2022-04-27 NOTE — Progress Notes (Signed)
Occupational Therapy Treatment Patient Details Name: Gregory [name]. MRN: [id number] DOB: 1956/04/23 Today's Date: 04/27/2022   History of present illness 66 y.o. male presenting for left leg weakness that occurred when he went to stand up from bed.  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   OT comments  Pt. Seen for skilled OT treatment session.  Wife present and active in session and his care.  Reviewed ue exercises with use of yellow theraband.  Pt. States he has already completed on L side so demonstrated on R side.  Also educated on chair push ups and using the arm rests of chair to pull and push on in preparation for increasing activity tolerance and abilities with sit/stands as able.  Pt. Remains motivated and wanting to participate and "get his strength back".  Continue to progress with acute OT goals as able.  Pt. Will benefit from cont. Therapies for strengthening and safety in preparation for home when able.      Recommendations for follow up therapy are one component of a multi-disciplinary discharge planning process, led by the attending physician.  Recommendations may be updated based on patient status, additional functional criteria and insurance authorization.    Follow Up Recommendations  Skilled nursing-short term rehab (<3 hours/day)    Assistance Recommended at Discharge Frequent or constant Supervision/Assistance  Patient can return home with the following  A lot of help with walking and/or transfers;A lot of help with bathing/dressing/bathroom;Assistance with cooking/housework;Help with stairs or ramp for entrance;Assist for transportation   Equipment Recommendations  Tub/shower bench    Recommendations for Other Services      Precautions / Restrictions Precautions Precautions: Fall Precaution Comments:  2 falls per month, LUE weak with shoulder tumor, LLE weakness, R foot drop       Mobility Bed Mobility                    Transfers                         Balance                                           ADL either performed or assessed with clinical judgement   ADL Overall ADL's : Needs assistance/impaired                                       General ADL Comments: focus of session was cont. instruction and review/return demo of BUE exercises for strengthening and aiding in increasing activity tolerance.    Extremity/Trunk Assessment              Vision       Perception     Praxis      Cognition Arousal/Alertness: Awake/alert Behavior During Therapy: WFL for tasks assessed/performed, Anxious Overall Cognitive Status: Within Functional Limits for tasks assessed                                 General Comments: some moments of emotion, partial tears  when he and his wife were talking about his journey and medical issues together        Exercises Other Exercises Other Exercises: demo and return demo of chair push ups. some limitations with lue but pt. able to push and show some pressure relief getting buttocks almost off of the chair.  also utilized arm rests for modified sit ups. pt. has recliner laying back, so was able to hold each arm rest and pull trunk upright.  again good for pressure relief but also precursers to the steps/sequencing for sit/stands and repositioning.    Shoulder Instructions       General Comments      Pertinent Vitals/ Pain       Pain Assessment Pain Assessment: Faces Faces Pain Scale: Hurts even more Pain Location: back, LUE/shoulder Pain Descriptors / Indicators: Constant, Grimacing Pain Intervention(s): Limited activity within patient's tolerance, Monitored during session, Repositioned  Home Living                                           Prior Functioning/Environment              Frequency  Min 2X/week        Progress Toward Goals  OT Goals(current goals can now be found in the care plan section)  Progress towards OT goals: Progressing toward goals     Plan Discharge plan remains appropriate    Co-evaluation                 AM-PAC OT "6 Clicks" Daily Activity     Outcome Measure   Help from another person eating meals?: A Little Help from another person taking care of personal grooming?: A Little Help from another person toileting, which includes using toliet, bedpan, or urinal?: Total Help from another person bathing (including washing, rinsing, drying)?: A Lot Help from another person to put on and taking off regular upper body clothing?: A Lot Help from another person to put on and taking off regular lower body clothing?: Total 6 Click Score: 12    End of Session    OT Visit Diagnosis: Hemiplegia and hemiparesis Hemiplegia - Right/Left: Left Hemiplegia - dominant/non-dominant: Dominant Hemiplegia - caused by: Other cerebrovascular disease   Activity Tolerance Patient tolerated treatment well   Patient Left in chair;with call bell/phone within reach;with family/visitor present   Nurse Communication          Time: 2703-5009 OT Time Calculation (min): 21 min  Charges: OT General Charges $OT Visit: 1 Visit OT Treatments $Therapeutic Exercise: 8-22 mins  Sonia Baller, COTA/L Acute Rehabilitation [phone]   Tanya Nones 04/27/2022, 11:21 AM

## 2022-04-27 NOTE — Progress Notes (Signed)
PROGRESS NOTE   Gregory [name].  RKY:[id number]    DOB: [birth year]    DOA: 04/17/2022  PCP: Pcp, No   I have briefly reviewed patients previous medical records in [facility].  Chief Complaint  Patient presents with   Leg Pain    Brief Narrative:  66 year old man PMH metastatic renal cell cancer with widespread bony metastasis to the spine, currently undergoing radiation therapy for known epidural tumor, undergoing chemotherapy, presented with worsening left leg weakness to any pain.  After discussion with his oncologist, plan was made to transfer to [facility] for radiation therapy.  Patient completed XRT and is medically optimized for DC to SNF but insurance denied SNF admission even after completing peer to peer review with Psychologist, occupational.  Patient/family appeal the denial which was upheld again, per [facility], now gone to governmental review for final decision which may not be back until next week.   Patient has been medically optimized for DC at least since 8/23 and awaiting discharge disposition.  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 --Patient refused MRI secondary to severe claustrophobia. -- Completed radiation therapy 8/22 - Patient completed XRT and is medically optimized for DC to SNF but insurance denied SNF admission even after completing peer to peer review with Psychologist, occupational.  Patient/family appealed the denial which was again upheld and is now being looked at by governmental review as of final decision.  In  detail with patient and girlfriend at bedside that if this does not succeed then he will likely have to go home.   Renal cell carcinoma ([place]) --Currently undergoing radiation and chemotherapy. Primary oncologist Dr. Delton Coombes and radiation oncologist Dr. Tammi Klippel --Dr. Raliegh Ip has further studies planned in the next week.  Outpatient follow-up with Dr. Raliegh Ip. -Patient was scheduled for an outpatient nuclear medicine whole-body scan on 4/24 and as per discussion with patient, family and Dr. Raliegh Ip, this was ordered in-house and shows progressive osseous metastatic disease since 01/30/2022.   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  Epidural disease present L4. --Continue Decadron per Dr. Delton Coombes -- Pain control does not seem to be an issue. NM whole-body bone scan 8/24 with progressive osseous metastatic disease since 01/30/2022.  Anemia Suspect chronic disease.  Hemoglobin up to 12.7.   Port-A-Cath in place   Tobacco use disorder -- NicoDerm patch   HIV screen reactive -- Spurious.  Confirmatory testing negative.  Body mass index is 17.09 kg/m./Underweight    DVT prophylaxis: heparin injection 5,000 Units Start: 04/17/22 1400 TED hose Start: 04/17/22 1336 SCDs Start: 04/17/22 1336     Code Status: Full Code:  Family Communication: Girlfriend at bedside. Disposition:  Status is: Inpatient Remains inpatient appropriate because: Pending insurance authorization by SNF.     Consultants:   Palliative care medicine  Procedures:   Completed XRT.  Antimicrobials:  Subjective:  Seen this morning along with 2 PT personnel working with patient was sitting on a reclining chair.  Girlfriend at bedside.  Patient appeared to be in good spirits, joking and  did not have any complaints.  Objective:   Vitals:   04/26/22 2016 04/27/22 0540 04/27/22 0540 04/27/22 1332  BP: 110/70  104/74 103/72  Pulse: 80  81 88  Resp: '16  17 10  '$ Temp: 98.3 F (36.8 C)  97.6 F (36.4 C) (!) 97.5 F (36.4 C)  TempSrc: Oral  Oral Oral  SpO2: 100%  97% 96%  Weight:  49.5 kg    Height:       Has a stable without any new findings for the last couple of days. General exam: Middle-age male, small built and thinly nourished, sitting up comfortably in reclining chair. Respiratory system: Clear to auscultation. Respiratory effort normal. Cardiovascular system: S1 & S2 heard, RRR. No JVD, murmurs, rubs, gallops or clicks. No pedal edema.  Not on telemetry. Gastrointestinal system: Abdomen is nondistended, soft and nontender. No organomegaly or masses felt. Normal bowel sounds heard. Central nervous system: Alert and oriented. No focal neurological deficits. Extremities: Right upper extremity and 4+ by 5 power.  Left upper extremity distally grade 4+ by 5 power, unable to elevate left shoulder against gravity and states that he had a "tumor" in that shoulder.  Right lower extremity with grade 4 x 5 power in left lower extremity with grade 2 x 5 power. Skin: No rashes, lesions or ulcers Psychiatry: Judgement and insight appear normal. Mood & affect appropriate.     Data Reviewed:   I have personally reviewed following labs and imaging studies   CBC: Recent Labs  Lab 04/26/22 0733  WBC 8.2  HGB 12.7*  HCT 39.8  MCV 83.6  PLT 397    Basic Metabolic Panel: Recent Labs  Lab 04/26/22 0733  NA 136  K 4.5  CL 104  CO2 24  GLUCOSE 99  BUN 24*  CREATININE 0.69  CALCIUM 8.6*    Liver Function Tests: No results for input(s): "AST", "ALT", "ALKPHOS", "BILITOT", "PROT", "ALBUMIN" in the last 168 hours.  CBG: Recent Labs  Lab 04/23/22 0758 04/26/22 0740 04/27/22 0741  GLUCAP 157* 94 114*    Microbiology Studies:  No results found for this or any  previous visit (from the past 240 hour(s)).  Radiology Studies:  NM Bone Scan Whole Body  Result Date: 04/26/2022 CLINICAL DATA:  Metastatic disease evaluation, history of renal cell carcinoma EXAM: NUCLEAR MEDICINE WHOLE BODY BONE SCAN TECHNIQUE: Whole body anterior and posterior images were obtained approximately 3 hours after intravenous injection of radiopharmaceutical. RADIOPHARMACEUTICALS:  19.2 mCi Technetium-80mMDP IV COMPARISON:  01/30/2022 Correlation: MRI cervical spine, thoracic spine, and lumbar spine from July in August of 2023 FINDINGS: Numerous sites of abnormal osseous tracer uptake are seen consistent with widespread osseous metastatic disease, increased in number since previous study. These include BILATERAL ribs, skull base, sternum, RIGHT humerus, spine, pelvis, and BILATERAL proximal femora. Multiple sites uptake in ribs, spine, RIGHT humerus, pelvis, and femora are new since prior exam. Expected urinary tract and soft tissue distribution of tracer. IMPRESSION: Progressive osseous metastatic disease since 01/30/2022 Electronically Signed   By: MLavonia DanaM.D.   On: 04/26/2022 13:59    Scheduled Meds:    Chlorhexidine Gluconate Cloth  6 each Topical Daily   dexamethasone  4 mg Oral Q12H   feeding supplement  237 mL Oral BID BM   gabapentin  100 mg Oral TID   heparin  5,000 Units Subcutaneous Q8H   nicotine  14 mg Transdermal Daily   nystatin  5 mL Oral QID   senna-docusate  1 tablet Oral BID   sodium chloride flush  3 mL Intravenous Q12H    Continuous Infusions:     LOS: 9 days     Vernell Leep, MD,  FACP, [facility], [facility], [facility] (Care Management Physician Certified) [place]  To contact the attending provider between 7A-7P or the covering provider during after hours 7P-7A, please log into the web site [email or web address] and access using universal [place] password for that web site. If you do not have the password, please call  the hospital operator.  04/27/2022, 4:20 PM

## 2022-04-27 NOTE — Progress Notes (Signed)
Physical Therapy Treatment Patient Details Name: Gregory [name]. MRN: [id number] DOB: 02/27/1956 Today's Date: 04/27/2022   History of Present Illness 66 y.o. male presenting for left leg weakness that occurred when he went to stand up from bed.  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    PT Comments    Patient is motivated to participate in therapy to regain function despite fear of falling due to Lt LE weakness. Pt required min assist to move to EOB from supine and Mod Assist to rise to stand with Denna Haggard. Pt only required min assist to power up from Stedy paddles and completed 10x sit<>stand for LE strengthening. Pt also able to complete standing heel raises for functional LE strengthening and after seated rest break in recliner initiated gait training today. Pt ambulated ~10' with RW, close chair follow for safety and Mod assist with PT blocking Lt knee, no buckling noted throughout.    Recommendations for follow up therapy are one component of a multi-disciplinary discharge planning process, led by the attending physician.  Recommendations may be updated based on patient status, additional functional criteria and insurance authorization.  Follow Up Recommendations  Skilled nursing-short term rehab (<3 hours/day) Can patient physically be transported by private vehicle: Yes   Assistance Recommended at Discharge Frequent or constant Supervision/Assistance  Patient can return home with the following A lot of help with walking and/or transfers;A lot of help with bathing/dressing/bathroom;Assistance with cooking/housework;Direct supervision/assist for medications management;Assist for transportation;Help with stairs or ramp for entrance   Equipment Recommendations  None recommended by PT    Recommendations for Other  Services       Precautions / Restrictions Precautions Precautions: Fall Precaution Comments: 2 falls per month, LUE weak with shoulder tumor, LLE weakness, R foot drop Restrictions Weight Bearing Restrictions: No     Mobility  Bed Mobility Overal bed mobility: Needs Assistance Bed Mobility: Supine to Sit     Supine to sit: Min assist, HOB elevated          Transfers Overall transfer level: Needs assistance Equipment used: Rolling walker (2 wheels) Transfers: Sit to/from Stand, Bed to chair/wheelchair/BSC Sit to Stand: From elevated surface, +2 safety/equipment, Min assist, Mod assist             Transfer via Lift Equipment: Stedy  Ambulation/Gait Ambulation/Gait assistance: Mod assist, +2 safety/equipment Gait Distance (Feet): 10 Feet Assistive device: Rolling walker (2 wheels) Gait Pattern/deviations: Step-to pattern, Decreased stride length Gait velocity: decr         Stairs             Wheelchair Mobility    Modified Rankin (Stroke Patients Only)       Balance Overall balance assessment: Needs assistance Sitting-balance support: Feet supported, No upper extremity supported Sitting balance-Leahy Scale: Fair     Standing balance support: Bilateral upper extremity supported, Reliant on assistive device for balance Standing balance-Leahy Scale: Poor Standing balance comment: reliant on RW                            Cognition Arousal/Alertness: Awake/alert Behavior During Therapy: WFL for tasks assessed/performed, Anxious Overall Cognitive Status: Within Functional Limits for tasks assessed  General Comments: pt motivated, obviously sad to hear he may not get approved for rehab        Exercises      General Comments        Pertinent Vitals/Pain Pain Assessment Pain Assessment: Faces Faces Pain Scale: Hurts little more Pain Location: back, LUE/shoulder Pain Descriptors /  Indicators: Constant, Grimacing Pain Intervention(s): Limited activity within patient's tolerance, Monitored during session, Repositioned    Home Living                          Prior Function            PT Goals (current goals can now be found in the care plan section) Acute Rehab PT Goals Patient Stated Goal: "I would love to walk again" PT Goal Formulation: With patient Time For Goal Achievement: 05/02/22 Potential to Achieve Goals: Good Progress towards PT goals: Progressing toward goals    Frequency    Min 2X/week      PT Plan Current plan remains appropriate    Co-evaluation              AM-PAC PT "6 Clicks" Mobility   Outcome Measure  Help needed turning from your back to your side while in a flat bed without using bedrails?: A Little Help needed moving from lying on your back to sitting on the side of a flat bed without using bedrails?: A Little Help needed moving to and from a bed to a chair (including a wheelchair)?: A Lot Help needed standing up from a chair using your arms (e.g., wheelchair or bedside chair)?: A Lot Help needed to walk in hospital room?: A Lot Help needed climbing 3-5 steps with a railing? : A Lot 6 Click Score: 14    End of Session Equipment Utilized During Treatment: Gait belt Activity Tolerance: Patient tolerated treatment well Patient left: in chair;with call bell/phone within reach;with family/visitor present Nurse Communication: Mobility status PT Visit Diagnosis: History of falling (Z91.81);Muscle weakness (generalized) (M62.81) Hemiplegia - Right/Left: Left     Time: 2683-4196 PT Time Calculation (min) (ACUTE ONLY): 35 min  Charges:  $Gait Training: 8-22 mins $Therapeutic Exercise: 8-22 mins                     Verner Mould, DPT Acute Rehabilitation Services Office [phone] Pager [phone]  04/27/22 12:21 PM

## 2022-04-27 NOTE — TOC Progression Note (Addendum)
Transition of Care (TOC) - Progression Note    Patient Details  Name: Ballard Budney. MRN: [id number] Date of Birth: Apr 15, 1956  Transition of Care [facility]) CM/SW Contact  Lennart Pall, LCSW Phone Number: 04/27/2022, 1:11 PM  Clinical Narrative:     Have been told by family and confirmed by [facility] liaison that pt/family appeal of the SNF coverage denial has been completed but denial has been upheld.  Appeal has now gone to [employer] (governmental review) for final decision which "could take several days".  I have alerted [facility] that new documentation has been entered by therapists today noting pt's progress and continued recommendation for SNF, however (per [facility]), this documentation will not be reviewed.  Decision by [facility] will be based off of original documentation only.  Expected Discharge Plan: [place] Barriers to Discharge: Continued Medical Work up, SNF Pending bed offer, Ship broker  Expected Discharge Plan and Services Expected Discharge Plan: [place] arrangements for the past 2 months: Single Family Home                                       Social Determinants of Health (SDOH) Interventions    Readmission Risk Interventions     No data to display

## 2022-04-28 LAB — GLUCOSE, CAPILLARY: Glucose-Capillary: 86 mg/dL (ref 70–99)

## 2022-04-28 NOTE — Plan of Care (Signed)
  Problem: Education: Goal: Knowledge of General Education information will improve Description: Including pain rating scale, medication(s)/side effects and non-pharmacologic comfort measures 04/28/2022 0008 by Quintin Alto, RN Outcome: Progressing 04/28/2022 0008 by Quintin Alto, RN Outcome: Progressing   Problem: Health Behavior/Discharge Planning: Goal: Ability to manage health-related needs will improve 04/28/2022 0008 by Quintin Alto, RN Outcome: Progressing 04/28/2022 0008 by Quintin Alto, RN Outcome: Progressing   Problem: Clinical Measurements: Goal: Ability to maintain clinical measurements within normal limits will improve 04/28/2022 0008 by Quintin Alto, RN Outcome: Progressing 04/28/2022 0008 by Quintin Alto, RN Outcome: Progressing Goal: Will remain free from infection 04/28/2022 0008 by Quintin Alto, RN Outcome: Progressing 04/28/2022 0008 by Quintin Alto, RN Outcome: Progressing Goal: Diagnostic test results will improve 04/28/2022 0008 by Quintin Alto, RN Outcome: Progressing 04/28/2022 0008 by Quintin Alto, RN Outcome: Progressing Goal: Respiratory complications will improve 04/28/2022 0008 by Quintin Alto, RN Outcome: Progressing 04/28/2022 0008 by Quintin Alto, RN Outcome: Progressing Goal: Cardiovascular complication will be avoided 04/28/2022 0008 by Quintin Alto, RN Outcome: Progressing 04/28/2022 0008 by Quintin Alto, RN Outcome: Progressing   Problem: Activity: Goal: Risk for activity intolerance will decrease 04/28/2022 0008 by Quintin Alto, RN Outcome: Progressing 04/28/2022 0008 by Quintin Alto, RN Outcome: Progressing   Problem: Nutrition: Goal: Adequate nutrition will be maintained 04/28/2022 0008 by Quintin Alto, RN Outcome: Progressing 04/28/2022 0008 by Quintin Alto, RN Outcome: Progressing   Problem: Coping: Goal: Level of anxiety will decrease 04/28/2022 0008 by Quintin Alto, RN Outcome:  Progressing 04/28/2022 0008 by Quintin Alto, RN Outcome: Progressing   Problem: Elimination: Goal: Will not experience complications related to bowel motility 04/28/2022 0008 by Quintin Alto, RN Outcome: Progressing 04/28/2022 0008 by Quintin Alto, RN Outcome: Progressing Goal: Will not experience complications related to urinary retention 04/28/2022 0008 by Quintin Alto, RN Outcome: Progressing 04/28/2022 0008 by Quintin Alto, RN Outcome: Progressing   Problem: Pain Managment: Goal: General experience of comfort will improve 04/28/2022 0008 by Quintin Alto, RN Outcome: Progressing 04/28/2022 0008 by Quintin Alto, RN Outcome: Progressing   Problem: Safety: Goal: Ability to remain free from injury will improve 04/28/2022 0008 by Quintin Alto, RN Outcome: Progressing 04/28/2022 0008 by Quintin Alto, RN Outcome: Progressing   Problem: Skin Integrity: Goal: Risk for impaired skin integrity will decrease 04/28/2022 0008 by Quintin Alto, RN Outcome: Progressing 04/28/2022 0008 by Quintin Alto, RN Outcome: Progressing

## 2022-04-28 NOTE — Progress Notes (Signed)
No charge Progress Note  Patient is medically optimized for DC since 04/25/2022 awaiting final decision regarding his insurance appeal, not likely to happen until Monday.  Patient has no new complaints.  Remains stable with no change in exam.  Saw him this morning along with his daughter at bedside.  Explained to them that if insurance ultimately denies his SNF admission, which is highly anticipated, he will have to DC home.  They verbalized understanding.  Daughter also indicated that she has applied Medicaid for him and it has been approved.  Gregory Leep, MD,  FACP, [facility], [facility], [facility] (Care Management Physician Certified) [facility] [place]  To contact the attending provider between 7A-7P or the covering provider during after hours 7P-7A, please log into the web site [email or web address] and access using universal  password for that web site. If you do not have the password, please call the hospital operator.

## 2022-04-29 NOTE — Progress Notes (Signed)
No charge Progress Note  No new or acute issues.  Continue to wait for insurance determination.  Discussed with patient and girlfriend at bedside.  TOC to follow-up in AM.  Referred to progress note by this provider from 04/27/2022 for details.  Vernell Leep, MD,  FACP, [facility], [facility], [facility] (Care Management Physician Certified) [facility] [place]  To contact the attending provider between 7A-7P or the covering provider during after hours 7P-7A, please log into the web site [email or web address] and access using universal [place] password for that web site. If you do not have the password, please call the hospital operator.

## 2022-04-29 NOTE — TOC Progression Note (Addendum)
Transition of Care (TOC) - Progression Note    Patient Details  Name: Gregory [name]. MRN: [id number] Date of Birth: Jan 24, 1956  Transition of Care [facility]) CM/SW Contact  Ross Ludwig, [place] Phone Number: 04/28/2022, 4:11pm  Clinical Narrative:     Patient awaiting decision by [facility] for patient to go to SNF for rehab.  CSW continuing to follow patient's progress throughout discharge planning.   Expected Discharge Plan: [place] Barriers to Discharge: Continued Medical Work up, SNF Pending bed offer, Ship broker  Expected Discharge Plan and Services Expected Discharge Plan: [place] arrangements for the past 2 months: Single Family Home                                       Social Determinants of Health (SDOH) Interventions    Readmission Risk Interventions     No data to display

## 2022-04-29 NOTE — TOC Progression Note (Signed)
Transition of Care (TOC) - Progression Note    Patient Details  Name: Gregory [name]. MRN: [id number] Date of Birth: 06/11/1956  Transition of Care [facility]) CM/SW Contact  Ross Ludwig, [place] Phone Number: 04/29/2022, 4:09 PM  Clinical Narrative:     CSW contacted PT to see if patient can be seen this weekend, PT was unable to see him, but will have him seen early Monday morning.  CSW to continue to follow patient's progress throughout discharge planning.   Expected Discharge Plan: [place] Barriers to Discharge: Continued Medical Work up, SNF Pending bed offer, Ship broker  Expected Discharge Plan and Services Expected Discharge Plan: [place] arrangements for the past 2 months: Single Family Home                                       Social Determinants of Health (SDOH) Interventions    Readmission Risk Interventions     No data to display

## 2022-04-30 NOTE — Progress Notes (Signed)
Physical Therapy Treatment Patient Details Name: Gregory [name]. MRN: [id number] DOB: 01-Sep-1956 Today's Date: 04/30/2022   History of Present Illness 66 y.o. male presenting for left leg weakness that occurred when he went to stand up from bed.  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    PT Comments    Pt remains highly motivated to participate in therapy and improve strength and functional mobility. Patient required Mod assist to sit up to EOB and steady balance and Mod +2 for sit<>stand and small step to move to recliner. Therapist blocking Lt knee during gait to prevent buckling. PT ambulated 2 short bouts with 1 episode of Lt knee buckling and max assist to prevent LOB. EOS pt resting in recliner and RN present for dressing change.    Recommendations for follow up therapy are one component of a multi-disciplinary discharge planning process, led by the attending physician.  Recommendations may be updated based on patient status, additional functional criteria and insurance authorization.  Follow Up Recommendations  Skilled nursing-short term rehab (<3 hours/day) Can patient physically be transported by private vehicle: Yes   Assistance Recommended at Discharge Frequent or constant Supervision/Assistance  Patient can return home with the following A lot of help with walking and/or transfers;A lot of help with bathing/dressing/bathroom;Assistance with cooking/housework;Direct supervision/assist for medications management;Assist for transportation;Help with stairs or ramp for entrance   Equipment Recommendations  None recommended by PT    Recommendations for Other Services       Precautions / Restrictions Precautions Precautions: Fall Precaution Comments: 2 falls per month, LUE weak with shoulder tumor, LLE weakness, R  foot drop Restrictions Weight Bearing Restrictions: No     Mobility  Bed Mobility Overal bed mobility: Needs Assistance Bed Mobility: Supine to Sit     Supine to sit: HOB elevated, Mod assist     General bed mobility comments: Mod assist to bring LE's off EOB and raise trunk, bed pad used to scoot to edge fully.    Transfers Overall transfer level: Needs assistance Equipment used: Rolling walker (2 wheels) Transfers: Sit to/from Stand, Bed to chair/wheelchair/BSC Sit to Stand: From elevated surface, +2 safety/equipment, Mod assist   Step pivot transfers: Mod assist, +2 physical assistance, +2 safety/equipment, From elevated surface       General transfer comment: Mod +2 for sit<>stand from EOB 2x, therapist blocking Lt knee and pt pulling up on RW to power up. Mod +2 to sequence small steps to recliner, assist to guide walker direction.    Ambulation/Gait Ambulation/Gait assistance: Mod assist, +2 safety/equipment Gait Distance (Feet): 14 Feet (7x2) Assistive device: Rolling walker (2 wheels) Gait Pattern/deviations: Step-to pattern, Decreased stride length Gait velocity: decr     General Gait Details: pt amb 2 short bouts with RW and manual assist to prevent buckling at Lt knee. Mod assist needed to manage walker position for direction of gait.   Stairs             Wheelchair Mobility    Modified Rankin (Stroke Patients Only)       Balance Overall balance assessment: Needs assistance Sitting-balance support: Feet supported, No upper extremity supported Sitting balance-Leahy Scale: Fair     Standing balance support: Bilateral upper extremity supported, Reliant on assistive device for balance Standing balance-Leahy Scale: Poor Standing balance comment: reliant on RW  Cognition Arousal/Alertness: Awake/alert Behavior During Therapy: WFL for tasks assessed/performed, Anxious Overall Cognitive Status: Within  Functional Limits for tasks assessed                                 General Comments: pt motivated, emotional about health and continue denial from rehab.        Exercises      General Comments        Pertinent Vitals/Pain Pain Assessment Pain Assessment: Faces Faces Pain Scale: Hurts even more Pain Location: all over Pain Descriptors / Indicators: Constant, Grimacing Pain Intervention(s): Limited activity within patient's tolerance, Monitored during session, Repositioned    Home Living                          Prior Function            PT Goals (current goals can now be found in the care plan section) Acute Rehab PT Goals Patient Stated Goal: "I would love to walk again" PT Goal Formulation: With patient Time For Goal Achievement: 05/02/22 Potential to Achieve Goals: Good Progress towards PT goals: Progressing toward goals    Frequency    Min 2X/week      PT Plan Current plan remains appropriate    Co-evaluation              AM-PAC PT "6 Clicks" Mobility   Outcome Measure  Help needed turning from your back to your side while in a flat bed without using bedrails?: A Little Help needed moving from lying on your back to sitting on the side of a flat bed without using bedrails?: A Little Help needed moving to and from a bed to a chair (including a wheelchair)?: A Lot Help needed standing up from a chair using your arms (e.g., wheelchair or bedside chair)?: A Lot Help needed to walk in hospital room?: A Lot Help needed climbing 3-5 steps with a railing? : A Lot 6 Click Score: 14    End of Session Equipment Utilized During Treatment: Gait belt Activity Tolerance: Patient tolerated treatment well Patient left: in chair;with call bell/phone within reach;with family/visitor present Nurse Communication: Mobility status PT Visit Diagnosis: History of falling (Z91.81);Muscle weakness (generalized) (M62.81) Hemiplegia - Right/Left:  Left     Time: 7829-5621 PT Time Calculation (min) (ACUTE ONLY): 28 min  Charges:  $Gait Training: 8-22 mins $Therapeutic Activity: 8-22 mins                     Verner Mould, DPT Acute Rehabilitation Services Office [phone] Pager [phone]  04/30/22 11:31 AM

## 2022-04-30 NOTE — Progress Notes (Signed)
PROGRESS NOTE   Gregory [name].  HCW:[id number]    DOB: October 28, 1955    DOA: 04/17/2022  PCP: Pcp, No   I have briefly reviewed patients previous medical records in [facility].  Chief Complaint  Patient presents with   Leg Pain    Brief Narrative:  66 year old man PMH metastatic renal cell cancer with widespread bony metastasis to the spine, currently undergoing radiation therapy for known epidural tumor, undergoing chemotherapy, presented with worsening left leg weakness to any pain.  After discussion with his oncologist, plan was made to transfer to [facility] for radiation therapy.  Patient completed XRT and is medically optimized for DC to SNF (since 04/25/2022) but insurance denied SNF admission even after completing peer to peer review with Psychologist, occupational.  Patient/family appealed the denial which was upheld again.  Per TOC, appeal has now gone to [employer] (governmental review) for final decision which "could take several days".   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 --Patient refused MRI secondary to severe claustrophobia. Patient completed XRT on 04/24/2022 and is medically optimized for DC to SNF (since 04/25/2022) but insurance denied SNF admission even after completing peer to peer review with Psychologist, occupational.  Patient/family appealed the denial which was upheld again.  Per TOC, appeal has now gone to [employer] (governmental review) for final decision which "could take several days".  As per PT follow-up today, ambulated  14 feet (improved from last week).  Per PT, if SNF for STR is not approved, they suggest long-term care facility as patient has 1 caregiver at home and currently requiring 2+ assist for safety.  Requested TOC to follow-up.   Renal cell carcinoma ([place]) --Currently undergoing radiation and chemotherapy. Primary oncologist Dr. Delton Coombes and radiation oncologist Dr. Tammi Klippel --Dr. Raliegh Ip has further studies planned in the next week.  Outpatient follow-up with Dr. Raliegh Ip after discharge. -Whole-body scan on 4/24 shows progressive osseous metastatic disease since 01/30/2022.   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  Epidural disease present L4. --Continue Decadron per Dr. Delton Coombes -- Pain control does not seem to be an issue. NM whole-body bone scan 8/24 with progressive osseous metastatic disease since 01/30/2022.  Anemia Suspect chronic disease.  Hemoglobin up to 12.7.   Port-A-Cath in place   Tobacco use disorder -- NicoDerm patch   HIV screen reactive -- Spurious.  Confirmatory testing negative.  Body mass index is 16.19 kg/m./Underweight    DVT prophylaxis: heparin injection 5,000 Units Start: 04/17/22 1400 TED hose Start: 04/17/22 1336 SCDs Start: 04/17/22 1336     Code Status: Full Code:  Family Communication: Girlfriend at bedside. Disposition:  Status is: Inpatient Remains inpatient appropriate because: Pending insurance authorization by SNF.     Consultants:   Palliative care medicine  Procedures:   Completed XRT.  Antimicrobials:      Subjective:  Patient denies any  complaints.  Seen earlier this morning prior to working with PT.  Patient and girlfriend at bedside asking if he would be eligible for LTC.  I indicated that I did not know  and would ask TOC to speak with them.  Objective:   Vitals:   04/29/22 0435 04/29/22 1515 04/29/22 2200 04/30/22 0433  BP: 106/74 106/72 97/70 107/67  Pulse: 76 87 80 80  Resp: '16 20 14 14  '$ Temp: 97.7 F (36.5 C) 98.6 F (37 C) 98.2 F (36.8 C) 98.1 F (36.7 C)  TempSrc: Oral  Oral Oral  SpO2: 97% 100% 99% 99%  Weight:    46.9 kg  Height:        General exam: Middle-age male, small built, frail and chronically ill looking, cachectic, lying portably propped up in bed without distress. Respiratory system: Clear to auscultation.  No increased work of breathing. Cardiovascular system: S1 and S2 heard, RRR.  No JVD, murmurs or pedal edema. Gastrointestinal system: Abdomen is nondistended, soft and nontender. No organomegaly or masses felt. Normal bowel sounds heard. Central nervous system: Alert and oriented. No focal neurological deficits. Extremities: Right upper extremity and 4+ by 5 power.  Left upper extremity distally grade 4+ by 5 power, unable to elevate left shoulder against gravity and states that he had a "tumor" in that shoulder.  Right lower extremity with grade 4 x 5 power in left lower extremity with grade 2 x 5 power. Skin: No rashes, lesions or ulcers Psychiatry: Judgement and insight appear normal. Mood & affect appropriate.     Data Reviewed:   I have personally reviewed following labs and imaging studies   CBC: Recent Labs  Lab 04/26/22 0733  WBC 8.2  HGB 12.7*  HCT 39.8  MCV 83.6  PLT 650    Basic Metabolic Panel: Recent Labs  Lab 04/26/22 0733  NA 136  K 4.5  CL 104  CO2 24  GLUCOSE 99  BUN 24*  CREATININE 0.69  CALCIUM 8.6*    Liver Function Tests: No results for input(s): "AST", "ALT", "ALKPHOS", "BILITOT", "PROT", "ALBUMIN" in the last 168 hours.  CBG: Recent Labs  Lab 04/26/22 0740 04/27/22 0741 04/28/22 0741  GLUCAP 94 114* 86    Microbiology Studies:  No results found for this or any previous visit (from the past 240  hour(s)).  Radiology Studies:  No results found.  Scheduled Meds:    Chlorhexidine Gluconate Cloth  6 each Topical Daily   dexamethasone  4 mg Oral Q12H   feeding supplement  237 mL Oral BID BM   gabapentin  100 mg Oral TID   heparin  5,000 Units Subcutaneous Q8H   nicotine  14 mg Transdermal Daily   nystatin  5 mL Oral QID   senna-docusate  1 tablet Oral BID   sodium chloride flush  3 mL Intravenous Q12H    Continuous Infusions:     LOS: 12 days     Vernell Leep, MD,  FACP, [facility], [facility] (Care Management Physician Certified) [place]  To contact the attending provider between 7A-7P or the covering provider during after hours 7P-7A, please log into the web site [email or web address] and access using universal [place] password for that web site. If you do not have the password, please call the hospital operator.  04/30/2022, 2:19 PM

## 2022-04-30 NOTE — Care Management Important Message (Signed)
Important Message  Patient Details IM Letter given to the Patient. Name: Gregory [name]. MRN: [id number] Date of Birth: 10-01-1955   Medicare Important Message Given:  Yes     Kerin Salen 04/30/2022, 9:18 AM

## 2022-04-30 NOTE — Progress Notes (Addendum)
Physical Therapy Treatment Patient Details Name: Gregory [name]. MRN: [id number] DOB: 1955/10/27 Today's Date: 04/30/2022   History of Present Illness 66 y.o. male presenting for left leg weakness that occurred when he went to stand up from bed.  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    PT Comments    Pt requesting return to bed due to discomfort in recliner, pt states RN/NT unavailable at this time. Pt spouse assisted with mobility for family training for safe transfers with RW. Mod +2 required to rise from recliner and safely manage RW. PT blocked Lt knee during stepping to move chair>bed. Max assist to raise LE's into bed and boost superiorly to reposition. Discussed equipment pt would need if he is denied rehab at [facility] and is discharged home (hospital bed & wheelchair cushion) and maximized [facility] services with OT, PT, RN, home aids. Alternative option to ST rehab is long term care facility which would be appropriate as pt has 1 caregiver at home and currently requires 2+ assist for safety. Continue to recommend best option of SNF for rehab, will progress as able in acute setting.    Recommendations for follow up therapy are one component of a multi-disciplinary discharge planning process, led by the attending physician.  Recommendations may be updated based on patient status, additional functional criteria and insurance authorization.  Follow Up Recommendations  Skilled nursing-short term rehab (<3 hours/day) Can patient physically be transported by private vehicle: Yes   Assistance Recommended at Discharge Frequent or constant Supervision/Assistance  Patient can return home with the following A lot of help with walking and/or transfers;A lot of help with bathing/dressing/bathroom;Assistance with cooking/housework;Direct  supervision/assist for medications management;Assist for transportation;Help with stairs or ramp for entrance   Equipment Recommendations  Wheelchair cushion (measurements PT);Hospital bed    Recommendations for Other Services       Precautions / Restrictions Precautions Precautions: Fall Precaution Comments: 2 falls per month, LUE weak with shoulder tumor, LLE weakness, R foot drop Restrictions Weight Bearing Restrictions: No     Mobility  Bed Mobility Overal bed mobility: Needs Assistance Bed Mobility: Sit to Supine     Supine to sit: HOB elevated, Mod assist Sit to supine: Max assist   General bed mobility comments: Pt's significant other assisting this session, Max assist to bring LE's onto bed and control lowering trunk to supine. +2 to boost superiorly in bed.    Transfers Overall transfer level: Needs assistance Equipment used: Rolling walker (2 wheels) Transfers: Sit to/from Stand, Bed to chair/wheelchair/BSC Sit to Stand: From elevated surface, +2 safety/equipment, Mod assist   Step pivot transfers: Mod assist, +2 physical assistance, +2 safety/equipment, From elevated surface       General transfer comment: Mod +2 with pt's significant other assisting for family training. Cues for use of gait belt and for pt's hand placement for power up. Mod +2 to rise and steady. Assist to guide walker to take small steps to EOB.    Ambulation/Gait Ambulation/Gait assistance: Mod assist, +2 safety/equipment Gait Distance (Feet): 14 Feet (7x2) Assistive device: Rolling walker (2 wheels) Gait Pattern/deviations: Step-to pattern, Decreased stride length Gait velocity: decr     General Gait Details: pt amb 2 short bouts with RW and manual assist to prevent buckling at Lt knee. Mod assist needed to manage walker position for direction  of gait.   Stairs             Wheelchair Mobility    Modified Rankin (Stroke Patients Only)       Balance Overall balance  assessment: Needs assistance Sitting-balance support: Feet supported, No upper extremity supported Sitting balance-Leahy Scale: Fair     Standing balance support: Bilateral upper extremity supported, Reliant on assistive device for balance Standing balance-Leahy Scale: Poor Standing balance comment: reliant on RW                            Cognition Arousal/Alertness: Awake/alert Behavior During Therapy: WFL for tasks assessed/performed, Anxious Overall Cognitive Status: Within Functional Limits for tasks assessed                                 General Comments: pt motivated, emotional about health and continue denial from rehab.        Exercises      General Comments        Pertinent Vitals/Pain Pain Assessment Pain Assessment: Faces Faces Pain Scale: Hurts even more Pain Location: all over Pain Descriptors / Indicators: Constant, Grimacing Pain Intervention(s): Limited activity within patient's tolerance, Monitored during session, Repositioned    Home Living                          Prior Function            PT Goals (current goals can now be found in the care plan section) Acute Rehab PT Goals Patient Stated Goal: "I would love to walk again" PT Goal Formulation: With patient Time For Goal Achievement: 05/02/22 Potential to Achieve Goals: Good Progress towards PT goals: Progressing toward goals    Frequency    Min 2X/week      PT Plan Current plan remains appropriate    Co-evaluation              AM-PAC PT "6 Clicks" Mobility   Outcome Measure  Help needed turning from your back to your side while in a flat bed without using bedrails?: A Little Help needed moving from lying on your back to sitting on the side of a flat bed without using bedrails?: A Little Help needed moving to and from a bed to a chair (including a wheelchair)?: A Lot Help needed standing up from a chair using your arms (e.g., wheelchair  or bedside chair)?: A Lot Help needed to walk in hospital room?: A Lot Help needed climbing 3-5 steps with a railing? : A Lot 6 Click Score: 14    End of Session Equipment Utilized During Treatment: Gait belt Activity Tolerance: Patient tolerated treatment well Patient left: in chair;with call bell/phone within reach;with family/visitor present Nurse Communication: Mobility status PT Visit Diagnosis: History of falling (Z91.81);Muscle weakness (generalized) (M62.81) Hemiplegia - Right/Left: Left     Time: 1761-6073 PT Time Calculation (min) (ACUTE ONLY): 14 min  Charges:  $Gait Training: 8-22 mins $Therapeutic Activity: 8-22 mins                     Verner Mould, DPT Acute Rehabilitation Services Office [phone] Pager [phone]  04/30/22 11:40 AM

## 2022-04-30 NOTE — TOC Progression Note (Signed)
Transition of Care (TOC) - Progression Note    Patient Details  Name: Aarron Wierzbicki. MRN: [id number] Date of Birth: [birth year]  Transition of Care [facility]) CM/SW [place], LCSW Phone Number: 04/30/2022, 11:58 AM  Clinical Narrative:    Pt's insurance appeal is still pending. cSW spoke with pt, pt's significant other [name], and pt's sister Marlowe Kays to discuss questions the family had about LTC placement. Pt's and family reported pt's Medicaid application was submitted.  Pt's and his family reported pt was being followed by [facility] for [facility] PT services before he was hospitalized. CSW informed the family she would follow up with the [facility] agency if pt is to d/c home. CSW also discussed PT recommendations for a hospital bed and a new seat cushion for pt's wheelchair. CSW informed pt she will follow up about the needed DME. TOC to follow throughout d/c planning.    Expected Discharge Plan: [place] Barriers to Discharge: Continued Medical Work up, SNF Pending bed offer, Ship broker  Expected Discharge Plan and Services Expected Discharge Plan: [place] arrangements for the past 2 months: Single Family Home                                       Social Determinants of Health (SDOH) Interventions    Readmission Risk Interventions     No data to display

## 2022-05-01 ENCOUNTER — Inpatient Hospital Stay: Payer: PPO | Admitting: Hematology

## 2022-05-01 NOTE — Progress Notes (Signed)
Patient refused wound care this morning.

## 2022-05-01 NOTE — Progress Notes (Addendum)
No charge Progress Note  No acute issues ongoing.  Patient seen this morning along with his girlfriend and OT at bedside.  No new complaints.  I had discussed with TOC prior to the visit and there was no word back on the insurance determination.  As per TOC, this may take up to 5 business days.  I updated patient and family.  I had also discussed patient case with Dr. Carolynn Comment, physician advisor director on 8/28, for any options if insurance denial was upheld.  She advised that there were no further options available and patient will likely become financially liable for extended stay.  Patient remains medically optimized for discharge to the next level of care.  Vernell Leep, MD,  FACP, [facility], [facility], [facility] (Care Management Physician Certified) [facility] [place]  To contact the attending provider between 7A-7P or the covering provider during after hours 7P-7A, please log into the web site [email or web address] and access using universal [place] password for that web site. If you do not have the password, please call the hospital operator.

## 2022-05-01 NOTE — Progress Notes (Signed)
Occupational Therapy Treatment Patient Details Name: Gregory [name]. MRN: [id number] DOB: April 03, 1956 Today's Date: 05/01/2022   History of present illness 66 y.o. male presenting for left leg weakness that occurred when he went to stand up from bed.  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   OT comments  Pt making slow progress toward all goals and remains motivated.  Pt transferring sit to stand with +1 assist today and to chair with +1 assist.  Educated girlfriend on safe transfer techniques as pt becomes anxious when standing and rushes transfers which makes him a fall risk.  Feel SNF rehab will give pt time to build some strength for safer transfer  home and give girlfriend time to learn techniques as pt gets stronger.  Will continue to see with focus on out of bed tasks and training girlfriend.    Recommendations for follow up therapy are one component of a multi-disciplinary discharge planning process, led by the attending physician.  Recommendations may be updated based on patient status, additional functional criteria and insurance authorization.    Follow Up Recommendations  Skilled nursing-short term rehab (<3 hours/day)    Assistance Recommended at Discharge Frequent or constant Supervision/Assistance  Patient can return home with the following  A lot of help with walking and/or transfers;A lot of help with bathing/dressing/bathroom;Assistance with cooking/housework;Help with stairs or ramp for entrance;Assist for transportation   Equipment Recommendations  Tub/shower bench    Recommendations for Other Services      Precautions / Restrictions Precautions Precautions: Fall Precaution Comments: 2 falls per month, LUE weak with shoulder tumor, LLE weakness, R foot drop Restrictions Weight Bearing Restrictions: No        Mobility Bed Mobility Overal bed mobility: Needs Assistance Bed Mobility: Supine to Sit     Supine to sit: HOB elevated, Mod assist     General bed mobility comments: Education provided to girlfriend regarding safe transitional movements in bed. If pt dc's home, rec hospital bed.    Transfers Overall transfer level: Needs assistance Equipment used: Rolling walker (2 wheels) Transfers: Sit to/from Stand, Bed to chair/wheelchair/BSC Sit to Stand: From elevated surface, Mod assist Stand pivot transfers: Mod assist         General transfer comment: Educated pt and girlfriend to not rush when they get up into standing. Take small steps and stay calm and reach back to the surface pt is going to insteady of rushing through the transfer which causes pt to be unsteady.     Balance Overall balance assessment: Needs assistance Sitting-balance support: Feet supported, No upper extremity supported Sitting balance-Leahy Scale: Fair     Standing balance support: Bilateral upper extremity supported, During functional activity, Reliant on assistive device for balance Standing balance-Leahy Scale: Poor Standing balance comment: reliant on RW                           ADL either performed or assessed with clinical judgement   ADL Overall ADL's : Needs assistance/impaired Eating/Feeding: Set up;Bed level                       Toilet Transfer: Moderate assistance;Stand-pivot;BSC/3in1 Toilet Transfer Details (indicate cue type and reason): required +1 assist today.  Educated girlfriend in safety with transfers.  Pt  panics sometimes when up and then just reaches for chair going to disregarding safe transfer techniques.  Educated girlfriend on better safety measures. Toileting- Clothing Manipulation and Hygiene: Total assistance;Sit to/from stand Toileting - Clothing Manipulation Details (indicate cue type and reason): Therpist stands with pt while second person  cleans pt.     Functional mobility during ADLs: Moderate assistance General ADL Comments: Session focused on sit to stand and educating girlfriend on safe transfer techinques. Pt is a fall risk if d/c home due to weakness, anxiety with being up on feet and unsafe transfer techinques.    Extremity/Trunk Assessment Upper Extremity Assessment Upper Extremity Assessment: LUE deficits/detail LUE Deficits / Details: 2+/5 shoulder, elbow 3/5, wrist 3/5, grip 3/5 LUE: Unable to fully assess due to pain LUE Coordination: WNL   Lower Extremity Assessment Lower Extremity Assessment: Defer to PT evaluation        Vision   Vision Assessment?: No apparent visual deficits   Perception Perception Perception: Not tested   Praxis Praxis Praxis: Intact    Cognition Arousal/Alertness: Awake/alert Behavior During Therapy: WFL for tasks assessed/performed, Anxious Overall Cognitive Status: Within Functional Limits for tasks assessed                                 General Comments: pt motivated, emotional about health and continue denial from rehab.        Exercises      Shoulder Instructions       General Comments Pt most limited by weakness and fear of falling    Pertinent Vitals/ Pain       Pain Assessment Pain Assessment: 0-10 Pain Score: 8  Pain Location: all over Pain Descriptors / Indicators: Constant, Grimacing Pain Intervention(s): Limited activity within patient's tolerance, Monitored during session, Repositioned, Patient requesting pain meds-RN notified, RN gave pain meds during session  Home Living                                          Prior Functioning/Environment              Frequency  Min 2X/week        Progress Toward Goals  OT Goals(current goals can now be found in the care plan section)  Progress towards OT goals: Progressing toward goals  Acute Rehab OT Goals Patient Stated Goal: to have less pain OT Goal  Formulation: With patient Time For Goal Achievement: 05/15/22 Potential to Achieve Goals: Fair ADL Goals Pt Will Perform Upper Body Bathing: with min assist;sitting Pt Will Perform Upper Body Dressing: with min assist;sitting Pt Will Perform Lower Body Dressing: with mod assist;sitting/lateral leans Pt Will Transfer to Toilet: with min guard assist;bedside commode Pt Will Perform Toileting - Clothing Manipulation and hygiene: with min assist;sit to/from stand Pt/caregiver will Perform Home Exercise Program: Increased ROM;Increased strength;Right Upper extremity;Left upper extremity;With theraband;With written HEP provided Additional ADL Goal #1: Patient will perform 10 min functional activity or exercise activity as evidence of improving activity tolerance  Plan Discharge plan remains appropriate    Co-evaluation                 AM-PAC OT "6 Clicks" Daily Activity     Outcome Measure   Help from another person eating meals?: A Little Help from another person taking care of personal grooming?: A Little  Help from another person toileting, which includes using toliet, bedpan, or urinal?: Total Help from another person bathing (including washing, rinsing, drying)?: A Lot Help from another person to put on and taking off regular upper body clothing?: A Lot Help from another person to put on and taking off regular lower body clothing?: Total 6 Click Score: 12    End of Session    OT Visit Diagnosis: Hemiplegia and hemiparesis Hemiplegia - Right/Left: Left Hemiplegia - dominant/non-dominant: Dominant Hemiplegia - caused by: Other cerebrovascular disease   Activity Tolerance Patient tolerated treatment well   Patient Left in chair;with call bell/phone within reach;with family/visitor present   Nurse Communication Mobility status        Time: 1898-4210 OT Time Calculation (min): 26 min  Charges: OT General Charges $OT Visit: 1 Visit OT Treatments $Self Care/Home  Management : 23-37 mins   Glenford Peers 05/01/2022, 10:35 AM

## 2022-05-01 NOTE — Plan of Care (Signed)
Update to extend all goals two weeks

## 2022-05-02 ENCOUNTER — Inpatient Hospital Stay (HOSPITAL_COMMUNITY)
Admission: RE | Admit: 2022-05-02 | Discharge: 2022-05-02 | Disposition: A | Discharge status: Home / Self Care | Payer: PPO | Source: Ambulatory Visit | Attending: Hematology | Admitting: Hematology

## 2022-05-02 DIAGNOSIS — D497 Neoplasm of unspecified behavior of endocrine glands and other parts of nervous system: Secondary | ICD-10-CM | POA: Diagnosis not present

## 2022-05-02 NOTE — Progress Notes (Signed)
PROGRESS NOTE  Gregory [name]. LSL:[id number] DOB: [birth year] DOA: 04/17/2022 PCP: Pcp, No   LOS: 14 days   Brief Narrative / Interim history: 66 year old man PMH metastatic renal cell cancer with widespread bony metastasis to the spine, currently undergoing radiation therapy for known epidural tumor, undergoing chemotherapy, presented with worsening left leg weakness to any pain.  After discussion with his oncologist, plan was made to transfer to [facility] for radiation therapy.  Patient completed XRT and is medically optimized for DC to SNF (since 04/25/2022) but insurance denied SNF admission even after completing peer to peer review with Psychologist, occupational.  Patient/family appealed the denial which was upheld again.  Per TOC, appeal has now gone to [employer] (governmental review) for final decision which "could take several days".  Subjective / 24h Interval events: Doing well.  No new complaints.  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  Patient/family appealed the denial which was upheld again.  Per TOC, appeal has now gone to [employer] (governmental review) for final decision which "could take several days". As per PT follow-up today, ambulated 14 feet (improved from last week).  Per PT, if SNF for STR is not  approved, they suggest long-term care facility as patient has 1 caregiver at home and currently requiring 2+ assist for safety.  Requested TOC to follow-up.  Active problems Renal cell carcinoma (HCC) -Currently undergoing radiation and chemotherapy. Primary oncologist Dr. Delton Coombes and radiation oncologist Dr. Tammi Klippel. Outpatient follow-up with Dr. Raliegh Ip after discharge. Whole-body scan on 4/24 shows progressive osseous metastatic disease since 01/30/2022. 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  Epidural disease present L4 -Continue Decadron per Dr. Delton Coombes. NM whole-body bone scan 8/24 with progressive osseous metastatic disease since 01/30/2022. Anemia -Suspect due to chronic disease/malignancy Port-A-Cath in place Tobacco use disorder- NicoDerm patch HIV screen reactive- Spurious.  Confirmatory testing negative.  Scheduled Meds:  Chlorhexidine Gluconate Cloth  6 each Topical Daily   dexamethasone  4 mg Oral Q12H   feeding supplement  237 mL Oral BID BM   gabapentin  100 mg Oral TID   heparin  5,000 Units Subcutaneous Q8H   nicotine  14 mg Transdermal Daily   nystatin  5 mL Oral QID   senna-docusate  1 tablet Oral BID   sodium chloride flush  3 mL Intravenous Q12H   Continuous Infusions: PRN Meds:.acetaminophen **OR** acetaminophen, bisacodyl, hydrALAZINE, HYDROmorphone (DILAUDID) injection, ipratropium, levalbuterol, LORazepam, ondansetron **OR** ondansetron (ZOFRAN) IV, oxyCODONE, phenol, senna-docusate, sodium phosphate, traZODone  Diet Orders (From admission, onward)     Start     Ordered   04/17/22 1336  Diet regular Room service appropriate? Yes; Fluid consistency: Thin  Diet effective now  Question Answer Comment  Room service appropriate?  Yes   Fluid consistency: Thin      04/17/22 1338            DVT prophylaxis: heparin injection 5,000 Units Start: 04/17/22 1400 TED hose Start: 04/17/22 1336 SCDs Start: 04/17/22 1336   Lab Results  Component Value Date   PLT 217 04/26/2022      Code Status: Full Code  Status is: Inpatient Remains inpatient appropriate because: Needs placement   Level of care: Med-Surg  Objective: Vitals:   05/01/22 0555 05/01/22 1326 05/01/22 2059 05/02/22 0408  BP: 99/65 103/70 108/72 97/64  Pulse: 77 83 85 76  Resp: '18 18 18 18  '$ Temp: 97.8 F (36.6 C) 98.8 F (37.1 C) 98.3 F (36.8 C) 98 F (36.7 C)  TempSrc: Oral Oral Oral Oral  SpO2: 98% 99% 95% 96%  Weight:      Height:        Intake/Output Summary (Last 24 hours) at 05/02/2022 1124 Last data filed at 05/02/2022 0943 Gross per 24 hour  Intake 240 ml  Output 100 ml  Net 140 ml   Wt Readings from Last 3 Encounters:  04/30/22 46.9 kg  04/12/22 52.3 kg  03/22/22 52.4 kg    Examination:  Constitutional: NAD Respiratory: clear to auscultation bilaterally, no wheezing, no crackles.  Cardiovascular: Regular rate and rhythm, no murmurs / rubs / gallops.    Data Reviewed: I have independently reviewed following labs and imaging studies   CBC Recent Labs  Lab 04/26/22 0733  WBC 8.2  HGB 12.7*  HCT 39.8  PLT 217  MCV 83.6  MCH 26.7  MCHC 31.9  RDW 17.9*    Recent Labs  Lab 04/26/22 0733  NA 136  K 4.5  CL 104  CO2 24  GLUCOSE 99  BUN 24*  CREATININE 0.69  CALCIUM 8.6*    ------------------------------------------------------------------------------------------------------------------ No results for input(s): "CHOL", "HDL", "LDLCALC", "TRIG", "CHOLHDL", "LDLDIRECT" in the last 72 hours.  No results found for: "HGBA1C" ------------------------------------------------------------------------------------------------------------------ No results for input(s): "TSH", "T4TOTAL", "T3FREE",  "THYROIDAB" in the last 72 hours.  Invalid input(s): "FREET3"  Cardiac Enzymes No results for input(s): "CKMB", "TROPONINI", "MYOGLOBIN" in the last 168 hours.  Invalid input(s): "CK" ------------------------------------------------------------------------------------------------------------------ No results found for: "BNP"  CBG: Recent Labs  Lab 04/26/22 0740 04/27/22 0741 04/28/22 0741  GLUCAP 94 114* 86    No results found for this or any previous visit (from the past 240 hour(s)).   Radiology Studies: No results found.   Marzetta Board, MD, PhD [facility]  Between 7 am - 7 pm I am available, please contact me via Amion (for emergencies) or Securechat (non urgent messages)  Between 7 pm - 7 am I am not available, please contact night coverage MD/APP via Amion

## 2022-05-02 NOTE — TOC Progression Note (Signed)
Transition of Care (TOC) - Progression Note    Patient Details  Name: Gregory [name]. MRN: [id number] Date of Birth: 1956-08-10  Transition of Care [facility]) CM/SW [place], LCSW Phone Number: 05/02/2022, 9:41 AM  Clinical Narrative:    CSW contact [employer] to follow up about appeal, they stated it is still pending. TOC to follow.   Expected Discharge Plan: [place] Barriers to Discharge: Continued Medical Work up, SNF Pending bed offer, Ship broker  Expected Discharge Plan and Services Expected Discharge Plan: [place] arrangements for the past 2 months: Single Family Home                                       Social Determinants of Health (SDOH) Interventions    Readmission Risk Interventions     No data to display

## 2022-05-03 ENCOUNTER — Encounter: Payer: Medicare Other | Admitting: Dietician

## 2022-05-03 ENCOUNTER — Inpatient Hospital Stay: Payer: PPO

## 2022-05-03 ENCOUNTER — Inpatient Hospital Stay: Payer: PPO | Admitting: Hematology

## 2022-05-03 DIAGNOSIS — D497 Neoplasm of unspecified behavior of endocrine glands and other parts of nervous system: Secondary | ICD-10-CM | POA: Diagnosis not present

## 2022-05-03 NOTE — Care Management Important Message (Signed)
Important Message  Patient Details IM Letter given to the Patient. Name: Gregory [name]. MRN: [id number] Date of Birth: April 28, 1956   Medicare Important Message Given:  Yes     Kerin Salen 05/03/2022, 10:25 AM

## 2022-05-03 NOTE — Progress Notes (Signed)
PT Cancellation Note  Patient Details Name: Gregory [name]. MRN: [id number] DOB: [birth year]   Cancelled Treatment:    Reason Eval/Treat Not Completed: Patient declined, no reason specified;Other (comment) (Patient emotional with significant other on arrival. [place] they recieved a call and he was denied SNF placement again. Briefly discussed benefits of palliative medicine to aid in decision making for ongoing care & benefits of hospice for supportive care.) Discussed pt's POC with care team. Will continue to follow and work with pt as able and appropriate during acute stay.  Verner Mould, DPT Acute Rehabilitation Services Office [phone] Pager [phone]  05/03/22 3:27 PM

## 2022-05-03 NOTE — Progress Notes (Signed)
PROGRESS NOTE  Gregory [name]. FMB:[id number] DOB: [birth year] DOA: 04/17/2022 PCP: Pcp, No   LOS: 15 days   Brief Narrative / Interim history: 66 year old man PMH metastatic renal cell cancer with widespread bony metastasis to the spine, currently undergoing radiation therapy for known epidural tumor, undergoing chemotherapy, presented with worsening left leg weakness to any pain.  After discussion with his oncologist, plan was made to transfer to [facility] for radiation therapy.  Patient completed XRT and is medically optimized for DC to SNF (since 04/25/2022) but insurance denied SNF admission even after completing peer to peer review with Psychologist, occupational.  Patient/family appealed the denial which was upheld again.  Per TOC, appeal has now gone to [employer] (governmental review) for final decision which "could take several days".  Subjective / 24h Interval events: Doing well this morning, no specific complaints.  No nausea or vomiting.  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  Patient/family appealed the denial which was upheld again.  Per TOC, appeal has now gone to [employer] (governmental review) for final decision which "could take several days". As per PT follow-up today, ambulated 14 feet (improved from last week).  Per PT, if SNF  for STR is not approved, they suggest long-term care facility as patient has 1 caregiver at home and currently requiring 2+ assist for safety.  Requested TOC to follow-up.  Active problems Renal cell carcinoma (HCC) -Currently undergoing radiation and chemotherapy. Primary oncologist Dr. Delton Coombes and radiation oncologist Dr. Tammi Klippel. Outpatient follow-up with Dr. Raliegh Ip after discharge. Whole-body scan on 4/24 shows progressive osseous metastatic disease since 01/30/2022. 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  Epidural disease present L4 -Continue Decadron per Dr. Delton Coombes. NM whole-body bone scan 8/24 with progressive osseous metastatic disease since 01/30/2022. Anemia -Suspect due to chronic disease/malignancy Port-A-Cath in place Tobacco use disorder- NicoDerm patch HIV screen reactive- Spurious.  Confirmatory testing negative.  Scheduled Meds:  Chlorhexidine Gluconate Cloth  6 each Topical Daily   dexamethasone  4 mg Oral Q12H   feeding supplement  237 mL Oral BID BM   gabapentin  100 mg Oral TID   heparin  5,000 Units Subcutaneous Q8H   nicotine  14 mg Transdermal Daily   nystatin  5 mL Oral QID   senna-docusate  1 tablet Oral BID   sodium chloride flush  3 mL Intravenous Q12H   Continuous Infusions: PRN Meds:.acetaminophen **OR** acetaminophen, bisacodyl, hydrALAZINE, HYDROmorphone (DILAUDID) injection, ipratropium, levalbuterol, LORazepam, ondansetron **OR** ondansetron (ZOFRAN) IV, oxyCODONE, phenol, senna-docusate, sodium phosphate, traZODone  Diet Orders (From admission, onward)     Start     Ordered   04/17/22 1336  Diet regular Room service appropriate? Yes; Fluid consistency: Thin  Diet effective now       Question Answer Comment  Room  service appropriate? Yes   Fluid consistency: Thin      04/17/22 1338            DVT prophylaxis: heparin injection 5,000 Units Start: 04/17/22 1400 TED hose Start: 04/17/22 1336 SCDs Start: 04/17/22 1336   Lab Results  Component Value Date   PLT 217 04/26/2022      Code Status: Full Code  Status is: Inpatient Remains inpatient appropriate because: Needs placement   Level of care: Med-Surg  Objective: Vitals:   05/02/22 0408 05/02/22 1359 05/02/22 2100 05/03/22 0604  BP: 97/64 99/68 102/74 96/73  Pulse: 76 84 81 79  Resp: '18 18 16 17  '$ Temp: 98 F (36.7 C)  98.1 F (36.7 C) 98 F (36.7 C)  TempSrc: Oral  Oral Oral  SpO2: 96% 98% 97% 99%  Weight:      Height:        Intake/Output Summary (Last 24 hours) at 05/03/2022 0946 Last data filed at 05/03/2022 0600 Gross per 24 hour  Intake --  Output 400 ml  Net -400 ml    Wt Readings from Last 3 Encounters:  04/30/22 46.9 kg  04/12/22 52.3 kg  03/22/22 52.4 kg    Examination:  Constitutional: NAD   Data Reviewed: I have independently reviewed following labs and imaging studies   CBC No results for input(s): "WBC", "HGB", "HCT", "PLT", "MCV", "MCH", "MCHC", "RDW", "LYMPHSABS", "MONOABS", "EOSABS", "BASOSABS", "BANDABS" in the last 168 hours.  Invalid input(s): "NEUTRABS", "BANDSABD"   No results for input(s): "NA", "K", "CL", "CO2", "GLUCOSE", "BUN", "CREATININE", "CALCIUM", "AST", "ALT", "ALKPHOS", "BILITOT", "ALBUMIN", "MG", "CRP", "DDIMER", "PROCALCITON", "LATICACIDVEN", "INR", "TSH", "CORTISOL", "HGBA1C", "AMMONIA", "BNP" in the last 168 hours.  Invalid input(s): "GFRCGP", "PHOSPHOROUS"   ------------------------------------------------------------------------------------------------------------------ No results for input(s): "CHOL", "HDL", "LDLCALC", "TRIG", "CHOLHDL", "LDLDIRECT" in the last 72 hours.  No results found for:  "HGBA1C" ------------------------------------------------------------------------------------------------------------------ No results for input(s): "TSH", "T4TOTAL", "T3FREE", "THYROIDAB" in the last 72 hours.  Invalid input(s): "FREET3"  Cardiac Enzymes No results for input(s): "CKMB", "TROPONINI", "MYOGLOBIN" in the last 168 hours.  Invalid input(s): "CK" ------------------------------------------------------------------------------------------------------------------ No results found for: "BNP"  CBG: Recent Labs  Lab 04/27/22 0741 04/28/22 0741  GLUCAP 114* 86     No results found for this or any previous visit (from the past 240 hour(s)).   Radiology Studies: No results found.   Marzetta Board, MD, PhD [facility]  Between 7 am - 7 pm I am available, please contact me via Amion (for emergencies) or Securechat (non urgent messages)  Between 7 pm - 7 am I am not available, please contact night coverage MD/APP via Amion

## 2022-05-03 NOTE — TOC Progression Note (Signed)
Transition of Care (TOC) - Progression Note    Patient Details  Name: Gregory [name]. MRN: [id number] Date of Birth: [birth year]  Transition of Care [facility]) CM/SW [place], LCSW Phone Number: 05/03/2022, 3:23 PM  Clinical Narrative:     CSW spoke with pt and pt's significant other ,they reported getting a call from [employer] the third party agency stating the appeal for SNF placement was denied. TOC to follow.  Expected Discharge Plan: [place] Barriers to Discharge: Continued Medical Work up, SNF Pending bed offer, Ship broker  Expected Discharge Plan and Services Expected Discharge Plan: [place] arrangements for the past 2 months: Single Family Home                                       Social Determinants of Health (SDOH) Interventions    Readmission Risk Interventions     No data to display

## 2022-05-04 DIAGNOSIS — D497 Neoplasm of unspecified behavior of endocrine glands and other parts of nervous system: Secondary | ICD-10-CM | POA: Diagnosis not present

## 2022-05-04 MED ORDER — HEPARIN SOD (PORK) LOCK FLUSH 100 UNIT/ML IV SOLN
500.0000 [IU] | Freq: Once | INTRAVENOUS | Status: AC
Start: 2022-05-04 — End: 2022-05-04
  Administered 2022-05-04: 500 [IU] via INTRAVENOUS
  Filled 2022-05-04: qty 5

## 2022-05-04 NOTE — Progress Notes (Signed)
PROGRESS NOTE  Gregory [name]. AJO:[id number] DOB: [birth year] DOA: 04/17/2022 PCP: Pcp, No   LOS: 16 days   Brief Narrative / Interim history: 66 year old man PMH metastatic renal cell cancer with widespread bony metastasis to the spine, currently undergoing radiation therapy for known epidural tumor, undergoing chemotherapy, presented with worsening left leg weakness to any pain.  After discussion with his oncologist, plan was made to transfer to [facility] for radiation therapy.  Patient completed XRT and is medically optimized for DC to SNF (since 04/25/2022) but insurance denied SNF admission even after completing peer to peer review with Psychologist, occupational.  Patient/family appealed the denial which was upheld again.  A third-party, [employer], reviewed and he is still denied.  Subjective / 24h Interval events: A bit upset that his SNF is being denied.  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)   Port-A-Cath in place  Principal problem Left leg weakness, acute on chronic secondary to metastatic renal cell carcinoma, metastatic disease to bones, lungs, liver-Patient completed XRT on 04/24/2022 and is medically optimized for DC to SNF (since 04/25/2022) but insurance denied SNF admission even after completing peer to peer review with Psychologist, occupational, and was denied on family appeal as well as after third-party review -It is clear that given progressive malignancy, he does need long-term care facility  Active problems Renal cell carcinoma ([place]) -Currently undergoing radiation and chemotherapy. Primary oncologist Dr. Delton Coombes and radiation oncologist Dr. Tammi Klippel.  Ongoing goals of care  today, palliative care also involved Extensive metastatic disease throughout the cervical spine with pathologic fracture and osseous retropulsion at C6 with epidural paraspinal tumor causing cord compression (chronic spinal cord injury).  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  Epidural disease present L4 -Continue Decadron per Dr. Delton Coombes. NM whole-body bone scan 8/24 with progressive osseous metastatic disease since 01/30/2022. Anemia -Suspect due to chronic disease/malignancy Port-A-Cath in place Tobacco use disorder- NicoDerm patch HIV screen reactive- Spurious.  Confirmatory testing negative.  Scheduled Meds:  Chlorhexidine Gluconate Cloth  6 each Topical Daily   dexamethasone  4 mg Oral Q12H   feeding supplement  237 mL Oral BID BM   gabapentin  100 mg Oral TID   heparin  5,000 Units Subcutaneous Q8H   nicotine  14 mg Transdermal Daily   nystatin  5 mL Oral QID   senna-docusate  1 tablet Oral BID   sodium chloride flush  3 mL Intravenous Q12H   Continuous Infusions: PRN Meds:.acetaminophen **OR** acetaminophen, bisacodyl, hydrALAZINE, HYDROmorphone (DILAUDID) injection, ipratropium, levalbuterol, LORazepam, ondansetron **OR** ondansetron (ZOFRAN) IV, oxyCODONE, phenol, senna-docusate, sodium phosphate, traZODone  Diet Orders (From admission, onward)     Start     Ordered   04/17/22 1336  Diet regular Room service appropriate? Yes; Fluid consistency: Thin  Diet effective now       Question Answer Comment  Room service appropriate? Yes   Fluid consistency: Thin      04/17/22 1338            DVT prophylaxis: heparin injection 5,000 Units Start: 04/17/22 1400 TED hose Start: 04/17/22 1336 SCDs Start: 04/17/22 1336   Lab Results  Component Value Date   PLT  217 04/26/2022      Code Status: Full Code  Status is: Inpatient Remains inpatient appropriate because: Needs placement   Level of care: Med-Surg  Objective: Vitals:    05/03/22 1407 05/03/22 2242 05/04/22 0645 05/04/22 0700  BP: 105/68 97/67 119/71   Pulse: 85 85 81   Resp: '16 18 18   '$ Temp: 98.2 F (36.8 C) 98.3 F (36.8 C) 97.9 F (36.6 C)   TempSrc: Oral Oral Oral   SpO2: 100% 99% 97%   Weight:    48.4 kg  Height:        Intake/Output Summary (Last 24 hours) at 05/04/2022 1129 Last data filed at 05/04/2022 0130 Gross per 24 hour  Intake --  Output 900 ml  Net -900 ml    Wt Readings from Last 3 Encounters:  05/04/22 48.4 kg  04/12/22 52.3 kg  03/22/22 52.4 kg    Examination:  Constitutional: No distress   Data Reviewed: I have independently reviewed following labs and imaging studies   CBC No results for input(s): "WBC", "HGB", "HCT", "PLT", "MCV", "MCH", "MCHC", "RDW", "LYMPHSABS", "MONOABS", "EOSABS", "BASOSABS", "BANDABS" in the last 168 hours.  Invalid input(s): "NEUTRABS", "BANDSABD"   No results for input(s): "NA", "K", "CL", "CO2", "GLUCOSE", "BUN", "CREATININE", "CALCIUM", "AST", "ALT", "ALKPHOS", "BILITOT", "ALBUMIN", "MG", "CRP", "DDIMER", "PROCALCITON", "LATICACIDVEN", "INR", "TSH", "CORTISOL", "HGBA1C", "AMMONIA", "BNP" in the last 168 hours.  Invalid input(s): "GFRCGP", "PHOSPHOROUS"   ------------------------------------------------------------------------------------------------------------------ No results for input(s): "CHOL", "HDL", "LDLCALC", "TRIG", "CHOLHDL", "LDLDIRECT" in the last 72 hours.  No results found for: "HGBA1C" ------------------------------------------------------------------------------------------------------------------ No results for input(s): "TSH", "T4TOTAL", "T3FREE", "THYROIDAB" in the last 72 hours.  Invalid input(s): "FREET3"  Cardiac Enzymes No results for input(s): "CKMB", "TROPONINI", "MYOGLOBIN" in the last 168 hours.  Invalid input(s): "CK" ------------------------------------------------------------------------------------------------------------------ No results found  for: "BNP"  CBG: Recent Labs  Lab 04/28/22 0741  GLUCAP 86     No results found for this or any previous visit (from the past 240 hour(s)).   Radiology Studies: No results found.   Marzetta Board, MD, PhD [facility]  Between 7 am - 7 pm I am available, please contact me via Amion (for emergencies) or Securechat (non urgent messages)  Between 7 pm - 7 am I am not available, please contact night coverage MD/APP via Amion

## 2022-05-04 NOTE — TOC Progression Note (Signed)
Transition of Care (TOC) - Progression Note    Patient Details  Name: Gregory [name]. MRN: [id number] Date of Birth: 1956-02-16  Transition of Care [facility]) CM/SW [place], LCSW Phone Number: 05/04/2022, 1:58 PM  Clinical Narrative:    Palliative is following will await recommendations. TOC continue to follow for discharge needs.    Expected Discharge Plan: [place] Barriers to Discharge: Continued Medical Work up, SNF Pending bed offer, Ship broker  Expected Discharge Plan and Services Expected Discharge Plan: [place] arrangements for the past 2 months: Single Family Home                                       Social Determinants of Health (SDOH) Interventions    Readmission Risk Interventions     No data to display

## 2022-05-04 NOTE — Progress Notes (Signed)
PT Cancellation Note  Patient Details Name: Gregory [name]. MRN: [id number] DOB: 10-27-1955   Cancelled Treatment:    Reason Eval/Treat Not Completed: Other (comment) (Pt had productive conversation with Palliative Care MD. Discussed the goals they talked about with pt and significant other [name]; discussed importance of energy conservation and pain management rather. Discussed that therapy is no longer appropriate and pt and [name] agree. PT will sign off at this time and skilled acute PT is not longer appropriate.)  Verner Mould, [place] Office [phone] Pager [phone]  05/04/22 1:15 PM

## 2022-05-04 NOTE — Progress Notes (Signed)
OT Cancellation Note  Patient Details Name: Gregory [name]. MRN: [id number] DOB: Nov 23, 1955   Cancelled Treatment:    Reason Eval/Treat Not Completed: Other (comment). Patient has decided to not continue therapy services so OT will sign off.    [name] 05/04/2022, 1:20 PM

## 2022-05-05 DIAGNOSIS — D497 Neoplasm of unspecified behavior of endocrine glands and other parts of nervous system: Secondary | ICD-10-CM | POA: Diagnosis not present

## 2022-05-05 LAB — GLUCOSE, CAPILLARY: Glucose-Capillary: 85 mg/dL (ref 70–99)

## 2022-05-05 MED ORDER — TRAZODONE HCL 50 MG PO TABS
50.0000 mg | ORAL_TABLET | Freq: Every evening | ORAL | Status: DC | PRN
  Administered 2022-05-05 – 2022-05-15 (×2): 50 mg via ORAL
  Filled 2022-05-05 (×2): qty 1

## 2022-05-05 NOTE — Consult Note (Signed)
Palliative Care Consultation Note  [name] is a 66 year old gentleman with metastatic renal cell cancer, diagnosed in 2017 followed by Dr. Delton [name] at [facility] in [place].  He recently completed radiation for a C6 extradural mass that was causing cord compression but continues to have significant pain related to widespread bony metastasis.  He has several areas of bony metastasis that are located in his thoracic and lumbar spine and are at risk for causing further spinal cord compression and cortical bone disruption leading to pathologic fracture.  He has also had weakness in his lower extremities and requires a significant amount of assistance at home with his care.   I met with [name] and his significant other Gregory [name], and also spoke with his daughter Gregory [name] by phone.  They had multiple questions about his prognosis and cancer treatment plan.  They shared with me they have had struggles in terms of how to best care for him and has been difficult given his pain levels and levels of suffering at home.  The patient has a home, he is currently unable to live there alone so he has been staying with his significant other Gregory [name] who is extremely devoted to his care.  His daughter is also a caregiver for an other family member who has serious illness and was recently discharged to her home.  Gregory [name] has been taking care of him for the last several months and would like to continue to do so however she has developed an acute visual loss issue and is going to need to complete 8 weeks of daily treatments to her eyes.  The family would like more prognostic information about what to expect in terms of prognosis and care needs.  Assessment and Recommendations: Given the extensive bone mets and extremely high risk for cord compression and pathologic fracture, I strongly advise against intensive rehab efforts. Limiting his mobility to essential movement and basic functional needs is safer for him.  Both patient and family understand this. We were able to get in touch with Dr. Raliegh [name] who feels his cancer has progressed despite aggressive treatment and a comfort care/hospice approach is best. He has recommended a hospice facility for his care and symptom management needs. Patient is likely under-utilizing pain medications- I discussed with him not waiting to ask for PRN until the pain is severe. I recommended a long acting medication, but he prefers to not take pills unless necessary. He is likely getting most of his pain relief from steroids and radiation.  Patient and his family are processing prognostic information they were given today. They will discuss hospice as well and code status and preferences for terminal care. Will check in with them tomorrow.  Gregory Hacker, DO Palliative Medicine   Time: 75 minutes

## 2022-05-05 NOTE — Progress Notes (Signed)
PROGRESS NOTE  Gregory [name]. GSU:[id number] DOB: [birth year] DOA: 04/17/2022 PCP: Pcp, No   LOS: 17 days   Brief Narrative / Interim history: 66 year old man PMH metastatic renal cell cancer with widespread bony metastasis to the spine, currently undergoing radiation therapy for known epidural tumor, undergoing chemotherapy, presented with worsening left leg weakness to any pain.  After discussion with his oncologist, plan was made to transfer to [facility] for radiation therapy.  Patient completed XRT and is medically optimized for DC to SNF (since 04/25/2022) but insurance denied SNF admission even after completing peer to peer review with Psychologist, occupational.  Patient/family appealed the denial which was upheld again.  A third-party, [employer], reviewed and he is still denied.  Subjective / 24h Interval events: They spoke with Dr. Raliegh Ip and Dr. 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 [facility])   Port-A-Cath in place  Principal problem Left leg weakness, acute on chronic secondary to metastatic renal cell carcinoma, metastatic disease to bones, lungs, liver-Patient completed XRT on 04/24/2022 and is medically optimized for DC to SNF (since 04/25/2022) but insurance denied SNF admission even after completing peer to peer review with Psychologist, occupational, and was denied on family appeal as well as after third-party review -It is clear that given progressive malignancy, he does need long-term care facility -After discussing with palliative care and primary oncologist, now looking into hospice care  Active problems Renal cell carcinoma ([place]) -Currently undergoing radiation  and chemotherapy. Primary oncologist Dr. Delton Coombes and radiation oncologist Dr. Tammi Klippel.  Ongoing goals of care today, palliative care also involved Extensive metastatic disease throughout the cervical spine with pathologic fracture and osseous retropulsion at C6 with epidural paraspinal tumor causing cord compression (chronic spinal cord injury).  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  Epidural disease present L4 -Continue Decadron per Dr. Delton Coombes. NM whole-body bone scan 8/24 with progressive osseous metastatic disease since 01/30/2022. Anemia -Suspect due to chronic disease/malignancy Port-A-Cath in place Tobacco use disorder- NicoDerm patch HIV screen reactive- Spurious.  Confirmatory testing negative.  Scheduled Meds:  Chlorhexidine Gluconate Cloth  6 each Topical Daily   dexamethasone  4 mg Oral Q12H   feeding supplement  237 mL Oral BID BM   gabapentin  100 mg Oral TID   heparin  5,000 Units Subcutaneous Q8H   nicotine  14 mg Transdermal Daily   nystatin  5 mL Oral QID   senna-docusate  1 tablet Oral BID   sodium chloride flush  3 mL Intravenous Q12H   Continuous Infusions: PRN Meds:.acetaminophen **OR** acetaminophen, bisacodyl, hydrALAZINE, HYDROmorphone (DILAUDID) injection, ipratropium, levalbuterol, LORazepam, ondansetron **OR** ondansetron (ZOFRAN) IV, oxyCODONE, phenol, senna-docusate, sodium phosphate, traZODone  Diet Orders (From admission, onward)     Start     Ordered   04/17/22 1336  Diet regular Room service appropriate? Yes; Fluid consistency: Thin  Diet effective now       Question Answer Comment  Room service appropriate? Yes   Fluid consistency: Thin      04/17/22 1338            DVT prophylaxis: heparin injection 5,000 Units Start: 04/17/22 1400 TED hose Start: 04/17/22  1336 SCDs Start: 04/17/22 1336   Lab Results  Component Value Date   PLT 217 04/26/2022      Code Status: Full Code  Status is:  Inpatient Remains inpatient appropriate because: Needs placement   Level of care: Med-Surg  Objective: Vitals:   05/04/22 0700 05/04/22 1933 05/05/22 0450 05/05/22 0454  BP:  102/74 99/72 (!) 140/61  Pulse:  82 83 66  Resp:  '18 18 18  '$ Temp:  98.4 F (36.9 C) 97.9 F (36.6 C) 98 F (36.7 C)  TempSrc:  Oral Oral Oral  SpO2:  98% 98% 98%  Weight: 48.4 kg     Height:        Intake/Output Summary (Last 24 hours) at 05/05/2022 1208 Last data filed at 05/05/2022 1021 Gross per 24 hour  Intake 480 ml  Output 500 ml  Net -20 ml    Wt Readings from Last 3 Encounters:  05/04/22 48.4 kg  04/12/22 52.3 kg  03/22/22 52.4 kg    Examination:  Constitutional: No distress   Data Reviewed: I have independently reviewed following labs and imaging studies   CBC No results for input(s): "WBC", "HGB", "HCT", "PLT", "MCV", "MCH", "MCHC", "RDW", "LYMPHSABS", "MONOABS", "EOSABS", "BASOSABS", "BANDABS" in the last 168 hours.  Invalid input(s): "NEUTRABS", "BANDSABD"   No results for input(s): "NA", "K", "CL", "CO2", "GLUCOSE", "BUN", "CREATININE", "CALCIUM", "AST", "ALT", "ALKPHOS", "BILITOT", "ALBUMIN", "MG", "CRP", "DDIMER", "PROCALCITON", "LATICACIDVEN", "INR", "TSH", "CORTISOL", "HGBA1C", "AMMONIA", "BNP" in the last 168 hours.  Invalid input(s): "GFRCGP", "PHOSPHOROUS"   ------------------------------------------------------------------------------------------------------------------ No results for input(s): "CHOL", "HDL", "LDLCALC", "TRIG", "CHOLHDL", "LDLDIRECT" in the last 72 hours.  No results found for: "HGBA1C" ------------------------------------------------------------------------------------------------------------------ No results for input(s): "TSH", "T4TOTAL", "T3FREE", "THYROIDAB" in the last 72 hours.  Invalid input(s): "FREET3"  Cardiac Enzymes No results for input(s): "CKMB", "TROPONINI", "MYOGLOBIN" in the last 168 hours.  Invalid input(s):  "CK" ------------------------------------------------------------------------------------------------------------------ No results found for: "BNP"  CBG: Recent Labs  Lab 05/05/22 0729  GLUCAP 85     No results found for this or any previous visit (from the past 240 hour(s)).   Radiology Studies: No results found.   Marzetta Board, MD, PhD [facility]  Between 7 am - 7 pm I am available, please contact me via Amion (for emergencies) or Securechat (non urgent messages)  Between 7 pm - 7 am I am not available, please contact night coverage MD/APP via Amion

## 2022-05-06 ENCOUNTER — Other Ambulatory Visit: Payer: Self-pay | Admitting: Hematology

## 2022-05-06 DIAGNOSIS — D497 Neoplasm of unspecified behavior of endocrine glands and other parts of nervous system: Secondary | ICD-10-CM | POA: Diagnosis not present

## 2022-05-06 NOTE — Progress Notes (Signed)
PROGRESS NOTE  Gregory [name]. GBT:[id number] DOB: 02/07/1956 DOA: 04/17/2022 PCP: Pcp, No   LOS: 18 days   Brief Narrative / Interim history: 66 year old man PMH metastatic renal cell cancer with widespread bony metastasis to the spine, currently undergoing radiation therapy for known epidural tumor, undergoing chemotherapy, presented with worsening left leg weakness to any pain.  After discussion with his oncologist, plan was made to transfer to [facility] for radiation therapy.  Patient completed XRT and is medically optimized for DC to SNF (since 04/25/2022) but insurance denied SNF admission even after completing peer to peer review with Psychologist, occupational.  Patient/family appealed the denial which was upheld again.  A third-party, [employer], reviewed and he is still denied.  Subjective / 24h Interval events: They spoke with Dr. Raliegh Ip and Dr. 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 [facility])   Port-A-Cath in place  Principal problem Left leg weakness, acute on chronic secondary to metastatic renal cell carcinoma, metastatic disease to bones, lungs, liver-Patient completed XRT on 04/24/2022 and is medically optimized for DC to SNF (since 04/25/2022) but insurance denied SNF admission even after completing peer to peer review with Psychologist, occupational, and was denied on family appeal as well as after third-party review -It is clear that given progressive malignancy, he does need long-term care facility -After discussing with palliative care and primary oncologist, now looking into hospice care  Active problems Renal cell carcinoma ([place]) -Currently undergoing radiation  and chemotherapy. Primary oncologist Dr. Delton Coombes and radiation oncologist Dr. Tammi Klippel.  Ongoing goals of care today, palliative care also involved Extensive metastatic disease throughout the cervical spine with pathologic fracture and osseous retropulsion at C6 with epidural paraspinal tumor causing cord compression (chronic spinal cord injury).  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  Epidural disease present L4 -Continue Decadron per Dr. Delton Coombes. NM whole-body bone scan 8/24 with progressive osseous metastatic disease since 01/30/2022. Anemia -Suspect due to chronic disease/malignancy Port-A-Cath in place Tobacco use disorder- NicoDerm patch HIV screen reactive- Spurious.  Confirmatory testing negative.  Scheduled Meds:  Chlorhexidine Gluconate Cloth  6 each Topical Daily   dexamethasone  4 mg Oral Q12H   feeding supplement  237 mL Oral BID BM   gabapentin  100 mg Oral TID   heparin  5,000 Units Subcutaneous Q8H   nicotine  14 mg Transdermal Daily   nystatin  5 mL Oral QID   senna-docusate  1 tablet Oral BID   sodium chloride flush  3 mL Intravenous Q12H   Continuous Infusions: PRN Meds:.acetaminophen **OR** acetaminophen, bisacodyl, hydrALAZINE, HYDROmorphone (DILAUDID) injection, ipratropium, levalbuterol, LORazepam, ondansetron **OR** ondansetron (ZOFRAN) IV, oxyCODONE, phenol, senna-docusate, sodium phosphate, traZODone  Diet Orders (From admission, onward)     Start     Ordered   04/17/22 1336  Diet regular Room service appropriate? Yes; Fluid consistency: Thin  Diet effective now       Question Answer Comment  Room service appropriate? Yes   Fluid consistency: Thin      04/17/22 1338            DVT prophylaxis: heparin injection 5,000 Units Start: 04/17/22 1400 TED hose Start: 04/17/22  1336 SCDs Start: 04/17/22 1336   Lab Results  Component Value Date   PLT 217 04/26/2022      Code Status: Full Code  Status is:  Inpatient Remains inpatient appropriate because: Needs placement   Level of care: Med-Surg  Objective: Vitals:   05/05/22 1259 05/05/22 2208 05/06/22 0500 05/06/22 0608  BP: 105/72 102/65  104/72  Pulse: 83 82  81  Resp: '16 17  18  '$ Temp: 98.7 F (37.1 C) 98.2 F (36.8 C)  98.2 F (36.8 C)  TempSrc: Oral Oral  Oral  SpO2: 99% 97%  97%  Weight:   48.2 kg   Height:        Intake/Output Summary (Last 24 hours) at 05/06/2022 1051 Last data filed at 05/06/2022 0854 Gross per 24 hour  Intake 360 ml  Output 600 ml  Net -240 ml    Wt Readings from Last 3 Encounters:  05/06/22 48.2 kg  04/12/22 52.3 kg  03/22/22 52.4 kg    Examination:  Constitutional: No distress   Data Reviewed: I have independently reviewed following labs and imaging studies   CBC No results for input(s): "WBC", "HGB", "HCT", "PLT", "MCV", "MCH", "MCHC", "RDW", "LYMPHSABS", "MONOABS", "EOSABS", "BASOSABS", "BANDABS" in the last 168 hours.  Invalid input(s): "NEUTRABS", "BANDSABD"   No results for input(s): "NA", "K", "CL", "CO2", "GLUCOSE", "BUN", "CREATININE", "CALCIUM", "AST", "ALT", "ALKPHOS", "BILITOT", "ALBUMIN", "MG", "CRP", "DDIMER", "PROCALCITON", "LATICACIDVEN", "INR", "TSH", "CORTISOL", "HGBA1C", "AMMONIA", "BNP" in the last 168 hours.  Invalid input(s): "GFRCGP", "PHOSPHOROUS"   ------------------------------------------------------------------------------------------------------------------ No results for input(s): "CHOL", "HDL", "LDLCALC", "TRIG", "CHOLHDL", "LDLDIRECT" in the last 72 hours.  No results found for: "HGBA1C" ------------------------------------------------------------------------------------------------------------------ No results for input(s): "TSH", "T4TOTAL", "T3FREE", "THYROIDAB" in the last 72 hours.  Invalid input(s): "FREET3"  Cardiac Enzymes No results for input(s): "CKMB", "TROPONINI", "MYOGLOBIN" in the last 168 hours.  Invalid input(s):  "CK" ------------------------------------------------------------------------------------------------------------------ No results found for: "BNP"  CBG: Recent Labs  Lab 05/05/22 0729  GLUCAP 85     No results found for this or any previous visit (from the past 240 hour(s)).   Radiology Studies: No results found.   Marzetta Board, MD, PhD [facility]  Between 7 am - 7 pm I am available, please contact me via Amion (for emergencies) or Securechat (non urgent messages)  Between 7 pm - 7 am I am not available, please contact night coverage MD/APP via Amion

## 2022-05-07 DIAGNOSIS — D497 Neoplasm of unspecified behavior of endocrine glands and other parts of nervous system: Secondary | ICD-10-CM | POA: Diagnosis not present

## 2022-05-07 MED ORDER — CELECOXIB 200 MG PO CAPS
200.0000 mg | ORAL_CAPSULE | Freq: Two times a day (BID) | ORAL | Status: DC
Start: 2022-05-07 — End: 2022-05-20
  Administered 2022-05-07 – 2022-05-20 (×27): 200 mg via ORAL
  Filled 2022-05-07 (×27): qty 1

## 2022-05-07 MED ORDER — DEXAMETHASONE 4 MG PO TABS
4.0000 mg | ORAL_TABLET | Freq: Two times a day (BID) | ORAL | Status: DC
  Administered 2022-05-07 – 2022-05-18 (×23): 4 mg via ORAL
  Filled 2022-05-07 (×23): qty 1

## 2022-05-07 NOTE — Progress Notes (Signed)
PROGRESS NOTE  Gregory [name]. KZL:[id number] DOB: [birth year] DOA: 04/17/2022 PCP: Pcp, No   LOS: 19 days   Brief Narrative / Interim history: 66 year old man PMH metastatic renal cell cancer with widespread bony metastasis to the spine, currently undergoing radiation therapy for known epidural tumor, undergoing chemotherapy, presented with worsening left leg weakness to any pain.  After discussion with his oncologist, plan was made to transfer to [facility] for radiation therapy.  Patient completed XRT and is medically optimized for DC to SNF (since 04/25/2022) but insurance denied SNF admission even after completing peer to peer review with Psychologist, occupational.  Patient/family appealed the denial which was upheld again.  A third-party, [employer], reviewed and he is still denied.  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 [facility])   Port-A-Cath in place  Principal problem Left leg weakness, acute on chronic secondary to metastatic renal cell carcinoma, metastatic disease to bones, lungs, liver-Patient completed XRT on 04/24/2022 and is medically optimized for DC to SNF (since 04/25/2022) but insurance denied SNF admission even after completing peer to peer review with Psychologist, occupational, and was denied on family appeal as well as after third-party review -It is clear that given progressive malignancy, he does need long-term care facility -After discussing with palliative care and primary oncologist, now looking into hospice care  Active problems Renal cell carcinoma ([place]) -Currently undergoing radiation and chemotherapy. Primary oncologist Dr. Delton Coombes  and radiation oncologist Dr. Tammi Klippel.  Ongoing goals of care today, palliative care also involved Extensive metastatic disease throughout the cervical spine with pathologic fracture and osseous retropulsion at C6 with epidural paraspinal tumor causing cord compression (chronic spinal cord injury).  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  Epidural disease present L4 -Continue Decadron per Dr. Delton Coombes. NM whole-body bone scan 8/24 with progressive osseous metastatic disease since 01/30/2022. Anemia -Suspect due to chronic disease/malignancy Port-A-Cath in place Tobacco use disorder- NicoDerm patch HIV screen reactive- Spurious.  Confirmatory testing negative.  Scheduled Meds:  celecoxib  200 mg Oral BID   dexamethasone  4 mg Oral BID BM   feeding supplement  237 mL Oral BID BM   gabapentin  100 mg Oral TID   heparin  5,000 Units Subcutaneous Q8H   nicotine  14 mg Transdermal Daily   nystatin  5 mL Oral QID   senna-docusate  1 tablet Oral BID   sodium chloride flush  3 mL Intravenous Q12H   Continuous Infusions: PRN Meds:.acetaminophen **OR** acetaminophen, bisacodyl, hydrALAZINE, HYDROmorphone (DILAUDID) injection, ipratropium, levalbuterol, LORazepam, ondansetron **OR** ondansetron (ZOFRAN) IV, oxyCODONE, phenol, senna-docusate, sodium phosphate, traZODone  Diet Orders (From admission, onward)     Start     Ordered   04/17/22 1336  Diet regular Room service appropriate? Yes; Fluid consistency: Thin  Diet effective now       Question Answer Comment  Room service appropriate? Yes   Fluid consistency: Thin      04/17/22 1338            DVT prophylaxis: heparin injection 5,000 Units Start: 04/17/22 1400 TED hose Start: 04/17/22 1336 SCDs Start: 04/17/22 1336   Lab Results  Component  Value Date   PLT 217 04/26/2022      Code Status: DNR  Status is: Inpatient Remains inpatient appropriate because: Needs placement   Level of care:  Med-Surg  Objective: Vitals:   05/06/22 1417 05/06/22 2019 05/07/22 0500 05/07/22 0502  BP: 106/64 106/66  101/70  Pulse: 88 82  81  Resp: '16 16  18  '$ Temp: 98.5 F (36.9 C) 97.7 F (36.5 C)  98 F (36.7 C)  TempSrc: Oral Oral  Oral  SpO2: 96% 96%  98%  Weight:   51.1 kg   Height:        Intake/Output Summary (Last 24 hours) at 05/07/2022 1119 Last data filed at 05/07/2022 0918 Gross per 24 hour  Intake 420 ml  Output 800 ml  Net -380 ml    Wt Readings from Last 3 Encounters:  05/07/22 51.1 kg  04/12/22 52.3 kg  03/22/22 52.4 kg    Examination:  Constitutional: No distress   Data Reviewed: I have independently reviewed following labs and imaging studies   CBC No results for input(s): "WBC", "HGB", "HCT", "PLT", "MCV", "MCH", "MCHC", "RDW", "LYMPHSABS", "MONOABS", "EOSABS", "BASOSABS", "BANDABS" in the last 168 hours.  Invalid input(s): "NEUTRABS", "BANDSABD"   No results for input(s): "NA", "K", "CL", "CO2", "GLUCOSE", "BUN", "CREATININE", "CALCIUM", "AST", "ALT", "ALKPHOS", "BILITOT", "ALBUMIN", "MG", "CRP", "DDIMER", "PROCALCITON", "LATICACIDVEN", "INR", "TSH", "CORTISOL", "HGBA1C", "AMMONIA", "BNP" in the last 168 hours.  Invalid input(s): "GFRCGP", "PHOSPHOROUS"   ------------------------------------------------------------------------------------------------------------------ No results for input(s): "CHOL", "HDL", "LDLCALC", "TRIG", "CHOLHDL", "LDLDIRECT" in the last 72 hours.  No results found for: "HGBA1C" ------------------------------------------------------------------------------------------------------------------ No results for input(s): "TSH", "T4TOTAL", "T3FREE", "THYROIDAB" in the last 72 hours.  Invalid input(s): "FREET3"  Cardiac Enzymes No results for input(s): "CKMB", "TROPONINI", "MYOGLOBIN" in the last 168 hours.  Invalid input(s):  "CK" ------------------------------------------------------------------------------------------------------------------ No results found for: "BNP"  CBG: Recent Labs  Lab 05/05/22 0729  GLUCAP 85     No results found for this or any previous visit (from the past 240 hour(s)).   Radiology Studies: No results found.   Marzetta Board, MD, PhD [facility]  Between 7 am - 7 pm I am available, please contact me via Amion (for emergencies) or Securechat (non urgent messages)  Between 7 pm - 7 am I am not available, please contact night coverage MD/APP via Amion

## 2022-05-08 DIAGNOSIS — D497 Neoplasm of unspecified behavior of endocrine glands and other parts of nervous system: Secondary | ICD-10-CM | POA: Diagnosis not present

## 2022-05-08 MED ORDER — TAMSULOSIN HCL 0.4 MG PO CAPS
0.4000 mg | ORAL_CAPSULE | Freq: Every day | ORAL | Status: DC
  Administered 2022-05-08 – 2022-05-19 (×12): 0.4 mg via ORAL
  Filled 2022-05-08 (×12): qty 1

## 2022-05-08 NOTE — TOC Progression Note (Signed)
Transition of Care (TOC) - Progression Note    Patient Details  Name: Gregory [name]. MRN: [id number] Date of Birth: Sep 01, 1956  Transition of Care [facility]) CM/SW [place], LCSW Phone Number: 05/08/2022, 10:57 AM  Clinical Narrative:    CSW received a requested to make referral to [facility] for respite stay. CSW spoke with Hospice Liaison to make referral. TOC will continue to follow for discharge needs.    Expected Discharge Plan: [place] Barriers to Discharge: Continued Medical Work up, SNF Pending bed offer, Ship broker  Expected Discharge Plan and Services Expected Discharge Plan: [place] arrangements for the past 2 months: Single Family Home                                       Social Determinants of Health (SDOH) Interventions    Readmission Risk Interventions     No data to display

## 2022-05-08 NOTE — Progress Notes (Addendum)
Daily Progress Note   Patient Name: Gregory [name].       Date: 05/08/2022 DOB: 04/18/1956  Age: 66 y.o. MRN#: [id number] Attending Physician: Caren Griffins, MD Primary Care Physician: Pcp, No Admit Date: 04/17/2022  Reason for Consultation/Follow-up: Establishing goals of care  Subjective:  Complains of constipation, passing gas, not able to urinate, episodic back pain continues, poor PO intake.   Length of Stay: 20  Current Medications: Scheduled Meds:   celecoxib  200 mg Oral BID   dexamethasone  4 mg Oral BID BM   feeding supplement  237 mL Oral BID BM   gabapentin  100 mg Oral TID   heparin  5,000 Units Subcutaneous Q8H   nicotine  14 mg Transdermal Daily   nystatin  5 mL Oral QID   senna-docusate  1 tablet Oral BID   sodium chloride flush  3 mL Intravenous Q12H   tamsulosin  0.4 mg Oral QPC supper    Continuous Infusions:   PRN Meds: acetaminophen **OR** acetaminophen, bisacodyl, hydrALAZINE, HYDROmorphone (DILAUDID) injection, ipratropium, levalbuterol, LORazepam, ondansetron **OR** ondansetron (ZOFRAN) IV, oxyCODONE, phenol, senna-docusate, sodium phosphate, traZODone  Physical Exam         Weak appearing gentleman resting in bed Shallow regular work of breathing Abdomen not distended Diffuse back pain  Vital Signs: BP 100/70 (BP Location: Right Arm)   Pulse 75   Temp 97.6 F (36.4 C) (Oral)   Resp 18   Ht '5\' 7"'$  (1.702 m)   Wt 51.5 kg   SpO2 100%   BMI 17.78 kg/m  SpO2: SpO2: 100 % O2 Device: O2 Device: Room Air O2 Flow Rate:    Intake/output summary:  Intake/Output Summary (Last 24 hours) at 05/08/2022 1316 Last data filed at 05/08/2022 1142 Gross per 24 hour  Intake 480 ml  Output 725 ml  Net -245 ml   LBM: Last BM Date :  05/07/22 Baseline Weight: Weight: 52 kg Most recent weight: Weight: 51.5 kg       Palliative Assessment/Data:      Patient Active Problem List   Diagnosis Date Noted   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   L4 spinal cord injury ([place]) 04/17/2022   Left leg weakness 04/17/2022   Port-A-Cath in place 02/04/2022   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  After discussion with his oncologist, plan was made to transfer to [facility] for radiation therapy.  Patient completed XRT, remains admitted to [facility] service, urinary retention, has extensive metastatic disease throughout the cervical spine with pathologic fracture and osseous retropulsion at C6 with epidural paraspinal tumor causing cord compression (chronic spinal cord injury).  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  Epidural disease present L4    Recommendations/Plan:    Remains on Decadron, has increasing symptom burden, high risk for worsening cord compression, goals of care discussed with patient and wife, they want symptom management inside a residential hospice facility, as has been previously discussed. TOC consult for hospice evaluation has been requested.     Goals of Care and Additional Recommendations: Hospice evaluation.   Code Status:    Code Status Orders  (From admission, onward)            Start     Ordered   05/07/22 1042  Do not attempt resuscitation (DNR)  Continuous       Question Answer Comment  In the event of cardiac or respiratory ARREST Do not call a "code blue"   In the event of cardiac or respiratory ARREST Do not perform Intubation, CPR, defibrillation or ACLS   In the event of cardiac or respiratory ARREST Use medication by any route, position, wound care, and other measures to relive pain and suffering. May use oxygen, suction and manual treatment of airway obstruction as needed for comfort.      05/07/22 1041           Code Status History     Date Active Date Inactive Code Status Order ID Comments User Context   04/17/2022 1338 05/07/2022 1041 Full Code [id number]  Gregory James, MD ED       Prognosis:  < 2 weeks  Discharge Planning: Hospice facility  Care plan was discussed with patient and wife, TRH MD.   Thank you for allowing the Palliative Medicine Team to assist in the care of this patient.  MOD MDM.  Greater than 50%  of this time was spent counseling and coordinating care related to the above assessment and plan.  Gregory Chance, MD  Please contact Palliative Medicine Team phone at [phone] for questions and concerns.

## 2022-05-08 NOTE — Progress Notes (Signed)
Palliative Care Progress Note  Met with Patient, his significant other [name], his daughter [name] and his brother-in-law this morning to discuss goals of care and in follow up to previous conversation. Mr. Heindl cancer has progressed despite treatment and he has wide spread bone mets that have been extremely painful and he has multiple areas of c-spine and thoracic metastasis that put him at very high risk for acute pathologic fracture and spinal cord compression. He has had excellent relief of pain after initiation of steroids and radiation treatment. He understands all interventions and goals are palliative and that his time is limited.  He is still unable to ambulate. His goal is to be able to transfer from his bed to a wheelchair safely and without severe pain-he tells me he just wants to be able to spend time with his family.  He is not sleeping well, but is hesitant to take medications to sleep. He needs encouragement to take opioid pain meds-he waits until his pain is severe before taking PRN.   Summary and Recommendations: DNR  Decadron is scheduled BID- the evening dose is likely causing his to be awake all night-will move that dose to earlier in the day-can also give as an 90m single dose in AM. Detailed discussion about his discharge plan and hospice care. They understand and accept the hospice philosophy. Patient has been living with KJuliann Pulsewho is his main caregiver, KJuliann Pulsehowever has to begin treatment for a serious condition with her eyes in order to prevent blindness that will require her to do eye drops every 2 hours for 6 weeks. His care will be too much for her during this time and she cannot delay treatment. We discussed possibilities including going to hospice house for very short term acute management of his pain (not for end of life unless he has an acute decline). His daughter reports that he was recently approved for Medicaid, so this will allow for [place] find him a long term  care bed with hospice if needed. Hospice may also be able to assist the patient and family with respite care while KJuliann Pulsecompletes her treatment. [facility]  ELane Hacker DO Palliative Medicine   Time: 60 min

## 2022-05-08 NOTE — TOC Progression Note (Signed)
Transition of Care (TOC) - Progression Note    Patient Details  Name: Gregory [name]. MRN: [id number] Date of Birth: 1956-03-08  Transition of Care [facility]) CM/SW Contact  Ross Ludwig, [place] Phone Number: 05/08/2022, 2:25 PM  Clinical Narrative:     CSW received phone call from previous [place].  Per Dian Situ from [facility] said patient would not qualify.  CSW called Tammy back at [phone] and informed her that  a new palliative note was just entered and to see if she can review again.  CSW awaiting for a call back from [place].  Expected Discharge Plan: [place] Barriers to Discharge: Continued Medical Work up, SNF Pending bed offer, Ship broker  Expected Discharge Plan and Services Expected Discharge Plan: [place] arrangements for the past 2 months: Single Family Home                                       Social Determinants of Health (SDOH) Interventions    Readmission Risk Interventions     No data to display

## 2022-05-08 NOTE — Progress Notes (Signed)
PROGRESS NOTE  Gregory [name]. WUJ:[id number] DOB: 1955-11-03 DOA: 04/17/2022 PCP: Pcp, No   LOS: 20 days   Brief Narrative / Interim history: 66 year old man PMH metastatic renal cell cancer with widespread bony metastasis to the spine, currently undergoing radiation therapy for known epidural tumor, undergoing chemotherapy, presented with worsening left leg weakness to any pain.  After discussion with his oncologist, plan was made to transfer to [facility] for radiation therapy.  Patient completed XRT and is medically optimized for DC to SNF (since 04/25/2022) but insurance denied SNF admission even after completing peer to peer review with Psychologist, occupational.  Patient/family appealed the denial which was upheld again.  A third-party, [employer], reviewed and he is still denied.  Palliative care consulted, now looking into placement to [facility] for symptom control.  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  Stable to go whenever a bed becomes available  Active problems Renal cell carcinoma  ([place]) -underwent radiation and chemotherapy. Primary oncologist Dr. Delton Coombes and radiation oncologist Dr. Tammi Klippel.  It appears that there are no further treatment options for him per primary oncologist.  Now pending hospice Urinary retention-retaining a bit more urine today, I wonder whether this is related to his progressive metastatic disease.  Start Flomax, if persistent will need a Foley catheter Extensive metastatic disease throughout the cervical spine with pathologic fracture and osseous retropulsion at C6 with epidural paraspinal tumor causing cord compression (chronic spinal cord injury).  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  Epidural disease present L4 -Continue Decadron per Dr. Delton Coombes. NM whole-body bone scan 8/24 with progressive osseous metastatic disease since 01/30/2022. Anemia -Suspect due to chronic disease/malignancy Port-A-Cath in place Tobacco use disorder- NicoDerm patch HIV screen reactive- Sp[facility].  Confirmatory testing negative.  Scheduled Meds:  celecoxib  200 mg Oral BID   dexamethasone  4 mg Oral BID BM   feeding supplement  237 mL Oral BID BM   gabapentin  100 mg Oral TID   heparin  5,000 Units Subcutaneous Q8H   nicotine  14 mg Transdermal Daily   nystatin  5 mL Oral QID   senna-docusate  1 tablet Oral BID   sodium chloride flush  3 mL Intravenous Q12H   tamsulosin  0.4 mg Oral QPC supper   Continuous Infusions: PRN Meds:.acetaminophen **OR** acetaminophen, bisacodyl, hydrALAZINE, HYDROmorphone (DILAUDID) injection, ipratropium, levalbuterol, LORazepam, ondansetron **OR** ondansetron (ZOFRAN) IV, oxyCODONE, phenol, senna-docusate, sodium phosphate, traZODone  Diet Orders (From admission, onward)     Start     Ordered   04/17/22 1336  Diet regular Room service appropriate? Yes; Fluid consistency: Thin  Diet effective now  Question Answer Comment  Room service appropriate? Yes   Fluid consistency: Thin       04/17/22 1338            DVT prophylaxis: heparin injection 5,000 Units Start: 04/17/22 1400 TED hose Start: 04/17/22 1336 SCDs Start: 04/17/22 1336   Lab Results  Component Value Date   PLT 217 04/26/2022      Code Status: DNR  Status is: Inpatient Remains inpatient appropriate because: Needs placement   Level of care: Med-Surg  Objective: Vitals:   05/07/22 1417 05/07/22 2228 05/08/22 0648 05/08/22 0700  BP: 102/70 103/71 100/70   Pulse: 82 79 75   Resp: '18 18 18   '$ Temp: 98.2 F (36.8 C) 98.6 F (37 C) 97.6 F (36.4 C)   TempSrc: Oral Oral Oral   SpO2: 100% 99% 100%   Weight:    51.5 kg  Height:        Intake/Output Summary (Last 24 hours) at 05/08/2022 1246 Last data filed at 05/08/2022 1142 Gross per 24 hour  Intake 480 ml  Output 725 ml  Net -245 ml    Wt Readings from Last 3 Encounters:  05/08/22 51.5 kg  04/12/22 52.3 kg  03/22/22 52.4 kg    Examination:  Constitutional: NAD   Data Reviewed: I have independently reviewed following labs and imaging studies   CBC No results for input(s): "WBC", "HGB", "HCT", "PLT", "MCV", "MCH", "MCHC", "RDW", "LYMPHSABS", "MONOABS", "EOSABS", "BASOSABS", "BANDABS" in the last 168 hours.  Invalid input(s): "NEUTRABS", "BANDSABD"   No results for input(s): "NA", "K", "CL", "CO2", "GLUCOSE", "BUN", "CREATININE", "CALCIUM", "AST", "ALT", "ALKPHOS", "BILITOT", "ALBUMIN", "MG", "CRP", "DDIMER", "PROCALCITON", "LATICACIDVEN", "INR", "TSH", "CORTISOL", "HGBA1C", "AMMONIA", "BNP" in the last 168 hours.  Invalid input(s): "GFRCGP", "PHOSPHOROUS"   ------------------------------------------------------------------------------------------------------------------ No results for input(s): "CHOL", "HDL", "LDLCALC", "TRIG", "CHOLHDL", "LDLDIRECT" in the last 72 hours.  No results found for: "HGBA1C" ------------------------------------------------------------------------------------------------------------------ No  results for input(s): "TSH", "T4TOTAL", "T3FREE", "THYROIDAB" in the last 72 hours.  Invalid input(s): "FREET3"  Cardiac Enzymes No results for input(s): "CKMB", "TROPONINI", "MYOGLOBIN" in the last 168 hours.  Invalid input(s): "CK" ------------------------------------------------------------------------------------------------------------------ No results found for: "BNP"  CBG: Recent Labs  Lab 05/05/22 0729  GLUCAP 85     No results found for this or any previous visit (from the past 240 hour(s)).   Radiology Studies: No results found.   Marzetta Board, MD, PhD [facility]  Between 7 am - 7 pm I am available, please contact me via Amion (for emergencies) or Securechat (non urgent messages)  Between 7 pm - 7 am I am not available, please contact night coverage MD/APP via Amion

## 2022-05-09 DIAGNOSIS — D497 Neoplasm of unspecified behavior of endocrine glands and other parts of nervous system: Secondary | ICD-10-CM | POA: Diagnosis not present

## 2022-05-09 MED ORDER — POLYETHYLENE GLYCOL 3350 17 G PO PACK
17.0000 g | PACK | Freq: Every day | ORAL | Status: DC | PRN
Start: 2022-05-09 — End: 2022-05-16
  Administered 2022-05-09 – 2022-05-15 (×3): 17 g via ORAL
  Filled 2022-05-09 (×2): qty 1

## 2022-05-09 NOTE — Progress Notes (Signed)
Daily Progress Note   Patient Name: Gregory [name].       Date: 05/09/2022 DOB: October 06, 1955  Age: 66 y.o. MRN#: [id number] Attending Physician: Antonieta Pert, MD Primary Care Physician: Pcp, No Admit Date: 04/17/2022  Reason for Consultation/Follow-up: Establishing goals of care  Subjective:  Complains of constipation, passing gas,  episodic back and abdominal pain continues, poor PO intake.   Length of Stay: 21  Current Medications: Scheduled Meds:   celecoxib  200 mg Oral BID   dexamethasone  4 mg Oral BID BM   feeding supplement  237 mL Oral BID BM   gabapentin  100 mg Oral TID   heparin  5,000 Units Subcutaneous Q8H   nicotine  14 mg Transdermal Daily   nystatin  5 mL Oral QID   senna-docusate  1 tablet Oral BID   sodium chloride flush  3 mL Intravenous Q12H   tamsulosin  0.4 mg Oral QPC supper    Continuous Infusions:   PRN Meds: acetaminophen **OR** acetaminophen, bisacodyl, hydrALAZINE, HYDROmorphone (DILAUDID) injection, ipratropium, levalbuterol, LORazepam, ondansetron **OR** ondansetron (ZOFRAN) IV, oxyCODONE, phenol, polyethylene glycol, senna-docusate, sodium phosphate, traZODone  Physical Exam         Weak appearing gentleman resting in bed Shallow regular work of breathing Abdomen not distended Diffuse back pain  Vital Signs: BP 101/75 (BP Location: Right Arm)   Pulse 86   Temp 97.8 F (36.6 C) (Oral)   Resp 18   Ht '5\' 7"'$  (1.702 m)   Wt 47.3 kg   SpO2 96%   BMI 16.33 kg/m  SpO2: SpO2: 96 % O2 Device: O2 Device: Room Air O2 Flow Rate:    Intake/output summary:  Intake/Output Summary (Last 24 hours) at 05/09/2022 1254 Last data filed at 05/09/2022 0900 Gross per 24 hour  Intake 720 ml  Output 600 ml  Net 120 ml    LBM: Last BM Date :  05/08/22 Baseline Weight: Weight: 52 kg Most recent weight: Weight: 47.3 kg       Palliative Assessment/Data:      Patient Active Problem List   Diagnosis Date Noted   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   L4 spinal cord injury ([place]) 04/17/2022   Left leg weakness 04/17/2022   Port-A-Cath in place 02/04/2022   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  After discussion with his oncologist, plan was made to transfer to [facility] for radiation therapy.  Patient completed XRT, remains admitted to [facility] service, urinary retention, has extensive metastatic disease throughout the cervical spine with pathologic fracture and osseous retropulsion at C6 with epidural paraspinal tumor causing cord compression (chronic spinal cord injury).  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  Epidural disease present L4    Recommendations/Plan: Ongoing functional decline, re discussed DNR with patient and wife, continue DNR DNI.    Remains on Decadron, has increasing symptom burden, high risk for worsening cord compression, goals of care discussed with patient and wife, they want symptom management inside a residential hospice facility, as has been previously discussed. TOC consult for residential hospice evaluation has been requested.     Goals of Care and Additional Recommendations: Hospice  evaluation.   Code Status:    Code Status Orders  (From admission, onward)           Start     Ordered   05/07/22 1042  Do not attempt resuscitation (DNR)  Continuous       Question Answer Comment  In the event of cardiac or respiratory ARREST Do not call a "code blue"   In the event of cardiac or respiratory ARREST Do not perform Intubation, CPR, defibrillation or ACLS   In the event of cardiac or respiratory ARREST Use medication by any route, position, wound care, and other measures to relive pain and suffering. May use oxygen, suction and manual treatment of airway obstruction as needed for comfort.      05/07/22 1041           Code Status History     Date Active Date Inactive Code Status Order ID Comments User Context   04/17/2022 1338 05/07/2022 1041 Full Code [id number]  [name], MD ED       Prognosis:  < 2 weeks  Discharge Planning: Hospice facility  Care plan was discussed with patient and wife,  RN  Thank you for allowing the Palliative Medicine Team to assist in the care of this patient.  MOD MDM.  Greater than 50%  of this time was spent counseling and coordinating care related to the above assessment and plan.  Loistine Chance, MD  Please contact Palliative Medicine Team phone at [phone] for questions and concerns.

## 2022-05-09 NOTE — Progress Notes (Signed)
PROGRESS NOTE  Gregory [name]. LDJ:[id number] DOB: 05-03-1956 DOA: 04/17/2022 PCP: Pcp, No   LOS: 21 days   Brief Narrative / Interim history: 66 year old man PMH metastatic renal cell cancer with widespread bony metastasis to the spine, currently undergoing radiation therapy for known epidural tumor, undergoing chemotherapy, presented with worsening left leg weakness to any pain.  After discussion with his oncologist, plan was made to transfer to [facility] for radiation therapy.  Patient completed XRT and is medically optimized for DC to SNF (since 04/25/2022) but insurance denied SNF admission even after completing peer to peer review with Psychologist, occupational.  Patient/family appealed the denial which was upheld again.  A third-party, [employer], reviewed and he is still denied.  Palliative care consulted, now looking into placement to [facility] for symptom control.  Subjective / 24h Interval events: Seen and examined this morning.  Wife at the bedside no new complaints.  Has difficulty moving his left side- not new, had some abdominal pain yesterday, complains of constipation, appears weak With poor oral intake overall. 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  Epidural disease present L4 Renal cell carcinoma w/ Metastatic disease to bones including, mets to lungs, liver-no further treatment options available: Leg weakness due to metastatic  disease.Patient completed XRT on 04/24/2022 and is medically optimized for DC to SNF (since 04/25/2022) but insurance denied SNF despite peer to peer review with Psychologist, occupational, and was denied on family appeal as well as after third-party review.  Palliative care following with patient's ongoing pain issues, poor oral intake and after oncology input planning for hospice care.  Awaiting for placement.  Family wants symptom management in the hospice setting.  Continue Decadron per Gregory [name].  NM whole-body scan 8/24 with progressive osseous metastatic disease since 5/30.  Anemia of metastatic disease/CKD Port-A-Cath in place  Tobacco use : Continue NicoDerm.   HIV screen reactive spurious,confirmatory test negative   DVT prophylaxis: heparin injection 5,000 Units Start: 04/17/22 1400 TED hose Start: 04/17/22 1336 SCDs Start: 04/17/22 1336  Lab Results  Component Value Date   PLT 217 04/26/2022    Code Status: DNR Status is: Inpatient Remains inpatient appropriate because: Waiting on placement.   Level of care: Med-Surg  Objective: Vitals:   05/08/22 2024 05/09/22 0500 05/09/22 0527 05/09/22 1100  BP: 100/78  121/81 114/81  Pulse: 82  82 91  Resp: '18  18 16  '$ Temp: 98 F (36.7 C)  97.9 F (36.6 C) 97.9 F (36.6 C)  TempSrc: Oral  Oral Oral  SpO2: 94%  98% 97%  Weight:  47.3 kg    Height:      Examination: General exam: Aaox3, frail, older than stated age, weak appearing. HEENT:Oral mucosa moist, Ear/Nose WNL grossly, dentition normal. Respiratory system: bilaterally diminished, no use of accessory muscle Cardiovascular system: S1 & S2 +, No JVD,. Gastrointestinal system: Abdomen soft,NT,ND,BS+ Nervous System:Alert, awake, moving right extremity well left side is weak  Extremities: LE ankle edema neg, distal  peripheral pulses palpable.  Skin: No rashes,no icterus. MSK: Normal muscle bulk,tone, power   Data Reviewed: I have independently reviewed following labs and  imaging studies  No results found for this or any previous visit (from the past 240 hour(s)).   Radiology Studies: No results found.  Antonieta Pert MD [facility]  Between 7 pm - 7 am I am not available, please contact night coverage MD/APP via Amion

## 2022-05-09 NOTE — TOC Progression Note (Addendum)
Transition of Care (TOC) - Progression Note    Patient Details  Name: [name]. MRN: [id number] Date of Birth: Oct 15, 1955  Transition of Care [facility]) CM/SW Contact  Leeroy Cha, RN Phone Number: 05/09/2022, 1:14 PM  Clinical Narrative:    [facility]-message left for patient to be reconsidered for admission. Due to new progress note and patient condition change. [facility] medical director will review case in the am and see if he now qualifies for in house hospice.  Expected Discharge Plan: Strong Barriers to Discharge: Continued Medical Work up, SNF Pending bed offer, Ship broker  Expected Discharge Plan and Services Expected Discharge Plan: [place] arrangements for the past 2 months: Single Family Home                                       Social Determinants of Health (SDOH) Interventions    Readmission Risk Interventions   No data to display

## 2022-05-10 ENCOUNTER — Inpatient Hospital Stay: Payer: PPO

## 2022-05-10 ENCOUNTER — Inpatient Hospital Stay: Payer: PPO | Admitting: Dietician

## 2022-05-10 ENCOUNTER — Inpatient Hospital Stay: Payer: PPO | Admitting: Hematology

## 2022-05-10 DIAGNOSIS — D497 Neoplasm of unspecified behavior of endocrine glands and other parts of nervous system: Secondary | ICD-10-CM | POA: Diagnosis not present

## 2022-05-10 NOTE — Progress Notes (Signed)
PROGRESS NOTE  Gregory [name]. [id number]:[id number] DOB: 1956-07-28 DOA: 04/17/2022 PCP: Pcp, No   LOS: 22 days   Brief Narrative / Interim history: 66 year old man PMH metastatic renal cell cancer with widespread bony metastasis to the spine, currently undergoing radiation therapy for known epidural tumor, undergoing chemotherapy, presented with worsening left leg weakness to any pain.  After discussion with his oncologist, plan was made to transfer to [facility] for radiation therapy.  Patient completed XRT and is medically optimized for DC to SNF (since 04/25/2022) but insurance denied SNF admission even after completing peer to peer review with Psychologist, occupational.  Patient/family appealed the denial which was upheld again.  A third-party, [employer], reviewed and he is still denied.  Palliative care consulted, now looking into placement to [facility] for symptom control.  Subjective / 24h Interval events: Seen and examined this morning still complains of abdominal discomfort, passed patient. 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  Epidural disease present L4 Renal cell carcinoma w/ Metastatic disease to bones including, mets to lungs, liver-no further treatment options available: Leg weakness due to metastatic disease.Patient completed XRT on 04/24/2022 and is medically optimized for DC to SNF (since  04/25/2022) but insurance denied SNF despite peer to peer review with Psychologist, occupational, and was denied on family appeal as well as after third-party review.  Palliative care following with patient's ongoing pain issues, poor oral intake and after oncology input planning for hospice care.  Awaiting for placement.  Family wants symptom management in the hospice setting.  Continue Decadron per Delton Coombes.  NM whole-body scan 8/24 with progressive osseous metastatic disease since 5/30.  Patient with ongoing functional decline, continue DNR/DNI.  Has increasing symptom burden and high risk of worsening cord compression.  Anemia of metastatic disease/CKD Port-A-Cath in place  Tobacco use : Continue NicoDerm.   HIV screen reactive spurious,confirmatory test negative   DVT prophylaxis: heparin injection 5,000 Units Start: 04/17/22 1400 TED hose Start: 04/17/22 1336 SCDs Start: 04/17/22 1336  Lab Results  Component Value Date   PLT 217 04/26/2022    Code Status: DNR Status is: Inpatient Remains inpatient appropriate because: Waiting on placement.   Level of care: Med-Surg  Objective: Vitals:   05/09/22 1806 05/09/22 2124 05/10/22 0448 05/10/22 0500  BP:  102/74 105/70   Pulse:  88 83   Resp: '18 18 18   '$ Temp:  97.8 F (36.6 C) 97.6 F (36.4 C)   TempSrc:  Oral Oral   SpO2:  96% 95%   Weight:    48.3 kg  Height:      Examination: General exam: AA, thin and older than stated age, weak appearing. HEENT:Oral mucosa moist, Ear/Nose WNL grossly, dentition normal. Respiratory system: bilaterally clear,no use of accessory muscle Cardiovascular system: S1 & S2 +, No JVD,. Gastrointestinal system: Abdomen soft,NT,ND,BS+ Nervous System:Alert, awake, weakness more on the left lower extremity  Extremities: LE ankle edema neg, distal  peripheral pulses palpable.  Skin: No rashes,no icterus. MSK: Normal muscle bulk,tone, power    Data Reviewed: I have independently reviewed following labs  and imaging studies  No results found for this or any previous visit (from the past 240 hour(s)).   Radiology Studies: No results found.  Antonieta Pert MD [facility]  Between 7 pm - 7 am I am not available, please contact night coverage MD/APP via Amion

## 2022-05-10 NOTE — Progress Notes (Signed)
Daily Progress Note   Patient Name: Gregory [name].       Date: 05/10/2022 DOB: [birth year]  Age: 66 y.o. MRN#: [id number] Attending Physician: Antonieta Pert, MD Primary Care Physician: Pcp, No Admit Date: 04/17/2022  Reason for Consultation/Follow-up: Establishing goals of care  Subjective: Patient had a very small bowel movement, is on bowel regimen, will think about taking the enema today.  Is using 10 mg of oxycodone for pain, has ongoing abdominal and back pain, took 4 doses of oral oxycodone in the past 24 hours.    Length of Stay: 22  Current Medications: Scheduled Meds:   celecoxib  200 mg Oral BID   dexamethasone  4 mg Oral BID BM   feeding supplement  237 mL Oral BID BM   gabapentin  100 mg Oral TID   heparin  5,000 Units Subcutaneous Q8H   nicotine  14 mg Transdermal Daily   nystatin  5 mL Oral QID   senna-docusate  1 tablet Oral BID   sodium chloride flush  3 mL Intravenous Q12H   tamsulosin  0.4 mg Oral QPC supper    Continuous Infusions:   PRN Meds: acetaminophen **OR** acetaminophen, bisacodyl, hydrALAZINE, HYDROmorphone (DILAUDID) injection, ipratropium, levalbuterol, LORazepam, ondansetron **OR** ondansetron (ZOFRAN) IV, oxyCODONE, phenol, polyethylene glycol, senna-docusate, sodium phosphate, traZODone  Physical Exam         Weak appearing gentleman resting in bed Shallow regular work of breathing Abdomen not distended Diffuse back pain  Vital Signs: BP 105/70 (BP Location: Right Arm)   Pulse 83   Temp 97.6 F (36.4 C) (Oral)   Resp 18   Ht '5\' 7"'$  (1.702 m)   Wt 48.3 kg   SpO2 95%   BMI 16.68 kg/m  SpO2: SpO2: 95 % O2 Device: O2 Device: Room Air O2 Flow Rate:    Intake/output summary: No intake or output data in the 24 hours ending 05/10/22  1301  LBM: Last BM Date : 05/08/22 Baseline Weight: Weight: 52 kg Most recent weight: Weight: 48.3 kg       Palliative Assessment/Data:      Patient Active Problem List   Diagnosis Date Noted   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   L4 spinal cord injury ([place]) 04/17/2022   Left leg weakness 04/17/2022   Port-A-Cath in place 02/04/2022  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  After discussion with his oncologist, plan was made to transfer to [facility] for radiation therapy.  Patient completed XRT, remains admitted to [facility] service, urinary retention, has extensive metastatic disease throughout the cervical spine with pathologic fracture and osseous retropulsion at C6 with epidural paraspinal tumor causing cord compression (chronic spinal cord injury).  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  Epidural disease present L4.    Recommendations/Plan: Ongoing functional decline, re discussed DNR with patient and wife, continue DNR DNI.  LBM 05-08-22  Remains on Decadron, has increasing symptom burden, high risk for worsening cord compression, goals of care discussed with patient and wife, they want symptom management inside a residential hospice facility, as has been previously discussed. TOC consult for residential hospice evaluation has been requested.  Patient remains under  consideration for transfer to residential hospice, he has requested for [facility], appreciate TOC assistance.  Goals of Care and Additional Recommendations: Hospice evaluation.   Code Status:    Code Status Orders  (From admission, onward)           Start     Ordered   05/07/22 1042  Do not attempt resuscitation (DNR)  Continuous       Question Answer Comment  In the event of cardiac or respiratory ARREST Do not call a "code blue"   In the event of cardiac or respiratory ARREST Do not perform Intubation, CPR, defibrillation or ACLS   In the event of cardiac or respiratory ARREST Use medication by any route, position, wound care, and other measures to relive pain and suffering. May use oxygen, suction and manual treatment of airway obstruction as needed for comfort.      05/07/22 1041           Code Status History     Date Active Date Inactive Code Status Order ID Comments User Context   04/17/2022 1338 05/07/2022 1041 Full Code [id number]  [name], MD ED       Prognosis:  < 2 weeks  Discharge Planning: Hospice facility  Care plan was discussed with patient and wife,    Thank you for allowing the Palliative Medicine Team to assist in the care of this patient.  MOD MDM.  Greater than 50%  of this time was spent counseling and coordinating care related to the above assessment and plan.  Loistine Chance, MD  Please contact Palliative Medicine Team phone at [phone] for questions and concerns.

## 2022-05-11 DIAGNOSIS — D497 Neoplasm of unspecified behavior of endocrine glands and other parts of nervous system: Secondary | ICD-10-CM | POA: Diagnosis not present

## 2022-05-11 NOTE — Progress Notes (Signed)
Daily Progress Note   Patient Name: Gregory [name].       Date: 05/11/2022 DOB: [birth year]  Age: 66 y.o. MRN#: [id number] Attending Physician: Antonieta Pert, MD Primary Care Physician: Pcp, No Admit Date: 04/17/2022  Reason for Consultation/Follow-up: Establishing goals of care  Subjective: Patient is awake alert resting in bed, wife at bedside.       Length of Stay: 23  Current Medications: Scheduled Meds:   celecoxib  200 mg Oral BID   dexamethasone  4 mg Oral BID BM   feeding supplement  237 mL Oral BID BM   gabapentin  100 mg Oral TID   heparin  5,000 Units Subcutaneous Q8H   nicotine  14 mg Transdermal Daily   senna-docusate  1 tablet Oral BID   sodium chloride flush  3 mL Intravenous Q12H   tamsulosin  0.4 mg Oral QPC supper    Continuous Infusions:   PRN Meds: acetaminophen **OR** acetaminophen, bisacodyl, hydrALAZINE, HYDROmorphone (DILAUDID) injection, ipratropium, levalbuterol, LORazepam, ondansetron **OR** ondansetron (ZOFRAN) IV, oxyCODONE, phenol, polyethylene glycol, senna-docusate, sodium phosphate, traZODone  Physical Exam         Weak appearing gentleman resting in bed Shallow regular work of breathing Abdomen not distended Diffuse back pain  Vital Signs: BP 102/71 (BP Location: Right Arm)   Pulse 79   Temp 97.9 F (36.6 C) (Oral)   Resp 14   Ht '5\' 7"'$  (1.702 m)   Wt 47.9 kg   SpO2 97%   BMI 16.54 kg/m  SpO2: SpO2: 97 % O2 Device: O2 Device: Room Air O2 Flow Rate:    Intake/output summary:  Intake/Output Summary (Last 24 hours) at 05/11/2022 1246 Last data filed at 05/11/2022 1000 Gross per 24 hour  Intake 490 ml  Output 325 ml  Net 165 ml    LBM: Last BM Date : 05/10/22 Baseline Weight: Weight: 52 kg Most recent weight: Weight: 47.9  kg       Palliative Assessment/Data:      Patient Active Problem List   Diagnosis Date Noted   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   L4 spinal cord injury ([place]) 04/17/2022   Left leg weakness 04/17/2022   Port-A-Cath in place 02/04/2022   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  After discussion with his oncologist, plan was made to transfer to [facility] for radiation therapy.  Patient completed XRT, remains admitted to [facility] service, urinary retention, has extensive metastatic disease throughout the cervical spine with pathologic fracture and osseous retropulsion at C6 with epidural paraspinal tumor causing cord compression (chronic spinal cord injury).  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  Epidural disease present L4.    Recommendations/Plan: Ongoing functional decline, re discussed DNR with patient and wife, continue DNR DNI.  LBM 05-08-22  Remains on Decadron, has increasing symptom burden, high risk for worsening cord compression, goals of care discussed with patient and wife, they want symptom management inside a residential hospice facility, as has been previously discussed. TOC consult for residential hospice evaluation has been requested.  Patient accepted to [facility], discussed with wife and Dr Hilma Favors.   Goals of Care and Additional  Recommendations: Hospice evaluation.   Code Status:    Code Status Orders  (From admission, onward)           Start     Ordered   05/07/22 1042  Do not attempt resuscitation (DNR)  Continuous       Question Answer Comment  In the event of cardiac or respiratory ARREST Do not call a "code blue"   In the event of cardiac or respiratory ARREST Do not perform Intubation, CPR, defibrillation or ACLS   In the event of cardiac or respiratory ARREST Use medication by any route, position, wound care, and other measures to relive pain and suffering. May use oxygen, suction and manual treatment of airway obstruction as needed for comfort.      05/07/22 1041           Code Status History     Date Active Date Inactive Code Status Order ID Comments User Context   04/17/2022 1338 05/07/2022 1041 Full Code [id number]  Gregory James, MD ED       Prognosis:  Few weeks.   Discharge Planning: Hospice facility  Care plan was discussed with patient and wife,    Thank you for allowing the Palliative Medicine Team to assist in the care of this patient.  Low MDM.  Greater than 50%  of this time was spent counseling and coordinating care related to the above assessment and plan.  Loistine Chance, MD  Please contact Palliative Medicine Team phone at [phone] for questions and concerns.

## 2022-05-11 NOTE — Progress Notes (Signed)
PROGRESS NOTE  Gregory [name]. ZDG:[id number] DOB: 1955/12/08 DOA: 04/17/2022 PCP: Pcp, No   LOS: 23 days   Brief Narrative / Interim history: 66 year old man PMH metastatic renal cell cancer with widespread bony metastasis to the spine, currently undergoing radiation therapy for known epidural tumor, undergoing chemotherapy, presented with worsening left leg weakness to any pain.  After discussion with his oncologist, plan was made to transfer to [facility] for radiation therapy.  Patient completed XRT and is medically optimized for DC to SNF (since 04/25/2022) but insurance denied SNF admission even after completing peer to peer review with Psychologist, occupational.  Patient/family appealed the denial which was upheld again.  A third-party, [employer], reviewed and he is still denied.  Palliative care consulted, now looking into placement to [facility] for symptom control.  Subjective / 24h Interval events: Reports having small bowel movement.  No other new complaints Wife wants to discuss with SW Overnight afebrile.   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  Epidural disease present L4 Renal cell carcinoma w/ Metastatic disease to bones including, mets to lungs, liver-no further treatment options available: Leg weakness due to metastatic disease.Patient completed XRT on 04/24/2022 and is medically optimized for DC to SNF (since 04/25/2022) but  insurance denied SNF despite peer to peer review with Psychologist, occupational, and was denied on family appeal as well as after third-party review.  Palliative care following with patient's ongoing pain issues, poor oral intake and after oncology input planning for hospice care.  Awaiting for placement.  Family wants symptom management in the hospice setting.  Continue Decadron per Delton Coombes.  NM whole-body scan 8/24 with progressive osseous metastatic disease since 5/30.  Patient with ongoing functional decline, continue DNR/DNI.  Has increasing symptom burden and high risk of worsening cord compression.  Anemia of metastatic disease/CKD Port-A-Cath in place  Tobacco use : Continue NicoDerm.   HIV screen reactive spurious,confirmatory test negative   DVT prophylaxis: heparin injection 5,000 Units Start: 04/17/22 1400 TED hose Start: 04/17/22 1336 SCDs Start: 04/17/22 1336  Lab Results  Component Value Date   PLT 217 04/26/2022    Code Status: DNR Status is: Inpatient Remains inpatient appropriate because: Waiting on placement.  TOC to discuss with family. Level of care: Med-Surg  Objective: Vitals:   05/10/22 0500 05/10/22 1335 05/10/22 2128 05/11/22 0425  BP:  97/64 104/73 102/71  Pulse:  86 81 79  Resp:  '15 14 14  '$ Temp:  98.8 F (37.1 C) 98.2 F (36.8 C) 97.9 F (36.6 C)  TempSrc:  Oral Oral Oral  SpO2:  98% 97% 97%  Weight: 48.3 kg   47.9 kg  Height:      Examination: General exam: AA, not in distress, older than stated age, weak appearing. HEENT:Oral mucosa moist, Ear/Nose WNL grossly, dentition normal. Respiratory system: bilaterally clear, no use of accessory muscle Cardiovascular system: S1 & S2 +, No JVD,. Gastrointestinal system: Abdomen soft,NT,ND,BS+ Nervous System:Alert, awake, weakness on LLE Extremities: LE  ankle edema neg distal peripheral pulses palpable.  Skin: No rashes,no icterus. MSK: Normal muscle bulk,tone, power    Data Reviewed: I have  independently reviewed following labs and imaging studies  No results found for this or any previous visit (from the past 240 hour(s)).   Radiology Studies: No results found.  Antonieta Pert MD [facility]  Between 7 pm - 7 am I am not available, please contact night coverage MD/APP via Amion

## 2022-05-11 NOTE — Plan of Care (Signed)

## 2022-05-11 NOTE — TOC Progression Note (Addendum)
Transition of Care (TOC) - Progression Note    Patient Details  Name: Gregory [name]. MRN: [id number] Date of Birth: 08-28-1956  Transition of Care [facility]) CM/SW Contact  Leeroy Cha, RN Phone Number: 05/11/2022, 12:16 PM  Clinical Narrative:    1216 tct-samatha at [facility].  The patient has been accepted but no bed available today.  May come open over the weekend. Attempted to call and talk with the family.  Was very rudely interrupted. Forde Dandy was ended with daughter stating that patient could talk for himself.  Spoke to the girlfriend and patient that he has been accepted into [facility] but there is not an available bed today and that he may get to go over the weekend.  Expected Discharge Plan: [place] Barriers to Discharge: Continued Medical Work up, SNF Pending bed offer, Ship broker  Expected Discharge Plan and Services Expected Discharge Plan: [place] arrangements for the past 2 months: Single Family Home                                       Social Determinants of Health (SDOH) Interventions    Readmission Risk Interventions   No data to display

## 2022-05-12 DIAGNOSIS — D497 Neoplasm of unspecified behavior of endocrine glands and other parts of nervous system: Secondary | ICD-10-CM | POA: Diagnosis not present

## 2022-05-12 NOTE — Progress Notes (Signed)
PROGRESS NOTE  Gregory [name]. NAT:[id number] DOB: 06-03-1956 DOA: 04/17/2022 PCP: Pcp, No   LOS: 24 days   Brief Narrative / Interim history: 66 year old man PMH metastatic renal cell cancer with widespread bony metastasis to the spine, currently undergoing radiation therapy for known epidural tumor, undergoing chemotherapy, presented with worsening left leg weakness to any pain.  After discussion with his oncologist, plan was made to transfer to [facility] for radiation therapy.  Patient completed XRT and is medically optimized for DC to SNF (since 04/25/2022) but insurance denied SNF admission even after completing peer to peer review with Psychologist, occupational.  Patient/family appealed the denial which was upheld again.  A third-party, [employer], reviewed and he is still denied.  Palliative care consulted, now looking into placement to [facility] for symptom control.  Subjective / 24h Interval events: Alert awake resting comfortably waiting for hospice.    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  Epidural disease present L4 Renal cell carcinoma w/ Metastatic disease to bones including, mets to lungs, liver-no further treatment options available: Leg weakness due to metastatic disease.Patient completed XRT on 04/24/2022 and is medically optimized for DC to SNF (since 04/25/2022) but insurance denied SNF despite peer to peer review with Games developer, and was denied on family appeal as well as after third-party review.  Palliative care following with patient's ongoing pain issues, poor oral intake and after oncology input planning for hospice care.  Awaiting for placement.  Family wants symptom management in the hospice setting.  Continue Decadron per Delton Coombes.  NM whole-body scan 8/24 with progressive osseous metastatic disease since 5/30.  Patient with ongoing functional decline, continue DNR/DNI.  Has increasing symptom burden and high risk of worsening cord compression.  Anemia of metastatic disease/CKD Port-A-Cath in place  Tobacco use : Continue NicoDerm.   HIV screen reactive spurious,confirmatory test negative   DVT prophylaxis: heparin injection 5,000 Units Start: 04/17/22 1400 TED hose Start: 04/17/22 1336 SCDs Start: 04/17/22 1336  Lab Results  Component Value Date   PLT 217 04/26/2022    Code Status: DNR Status is: Inpatient Remains inpatient appropriate because: Waiting on placement at [place] Level of care: Med-Surg  Objective: Vitals:   05/11/22 0425 05/11/22 1328 05/11/22 2115 05/12/22 0410  BP: 1'02/71 98/69 99/64 '$ 102/67  Pulse: 79 87 77 73  Resp: '14 18 14 14  '$ Temp: 97.9 F (36.6 C) 98.6 F (37 C) 98.4 F (36.9 C) 97.7 F (36.5 C)  TempSrc: Oral Oral Oral Oral  SpO2: 97% 100% 97% 100%  Weight: 47.9 kg   49.5 kg  Height:      Examination: General exam: AA, older than stated age, weak appearing. HEENT:Oral mucosa moist, Ear/Nose WNL grossly, dentition normal. Respiratory system: bilaterally diminished, no use of accessory muscle Cardiovascular system: S1 & S2 +, No JVD,. Gastrointestinal system: Abdomen soft,NT,ND,BS+ Nervous System:Alert, awake, weak left lower extremity  Extremities: LE ankle edema neg, distal peripheral pulses palpable.  Skin: No  rashes,no icterus. MSK: Normal muscle bulk,tone, power    Data Reviewed: I have independently reviewed following labs and  imaging studies  No results found for this or any previous visit (from the past 240 hour(s)).   Radiology Studies: No results found.  Antonieta Pert MD [facility]  Between 7 pm - 7 am I am not available, please contact night coverage MD/APP via Amion

## 2022-05-12 NOTE — Progress Notes (Signed)
PROGRESS NOTE  Gregory [name]. JGG:[id number] DOB: March 02, 1956 DOA: 04/17/2022 PCP: Pcp, No   LOS: 27 days   Brief Narrative / Interim history: 66 year old man PMH metastatic renal cell cancer with widespread bony metastasis to the spine, currently undergoing radiation therapy for known epidural tumor, undergoing chemotherapy, presented with worsening left leg weakness to any pain.  After discussion with his oncologist, plan was made to transfer to [facility] for radiation therapy.  Patient completed XRT and is medically optimized for DC to SNF (since 04/25/2022) but insurance denied SNF admission even after completing peer to peer review with Psychologist, occupational.  Patient/family appealed the denial which was upheld again.  A third-party, [employer], reviewed and he is still denied.  Palliative care consulted, now looking into placement to [facility] for symptom control.  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  Epidural disease L4 Renal cell carcinoma w/ Metastatic disease to bones including, mets to lungs, liver-no further treatment options available: Leg weakness due to metastatic disease.Patient completed XRT on 04/24/2022 and is medically optimized for DC to SNF (since 04/25/2022) but insurance denied SNF despite peer to peer review with Games developer, and was denied on family appeal as well as after third-party review.  Palliative care following with patient's ongoing pain issues, poor oral intake and after oncology input planning for hospice care.  Awaiting for placement.  Family wants symptom management in the hospice setting.  Continue Decadron per Delton Coombes.  NM whole-body scan 8/24 with progressive osseous metastatic disease since 5/30.  Patient with ongoing functional decline, continue DNR/DNI.  Has increasing symptom burden and high risk of worsening cord compression.  At this time waiting for SNF bed offers for symptom management  Anemia of metastatic disease/CKD Port-A-Cath in place  Tobacco use : Continue NicoDerm.   HIV screen reactive spurious,confirmatory test negative  Disposition: Difficult placement-as wife unable to take care of him at home, she is at the bedside. Discussed with case worker/Dr. Hilma Favors: Plan is to provide symptom management and Respite at [facility] until the LTC bed could be secured  DVT prophylaxis: heparin injection 5,000 Units Start: 04/17/22 1400 TED hose Start: 04/17/22 1336 SCDs Start: 04/17/22 1336  Lab Results  Component Value Date   PLT 217 04/26/2022    Code Status: DNR Status is: Inpatient Remains inpatient appropriate because: Waiting on placement at [place] Level of care: Med-Surg  Objective: Vitals:   05/14/22 0605 05/14/22 1343 05/14/22 2149 05/15/22 0636  BP: 108/76 102/67 108/74 99/70  Pulse: 87 95 87 80  Resp: '18 16 16 14  '$ Temp: 98 F (36.7 C) 98.8 F (37.1 C) 98.4 F (36.9 C) 98.1 F (36.7 C)  TempSrc: Oral  Oral Oral  SpO2: 96% 96% 96% 97%  Weight:    47.9 kg  Height:       Examination: General exam: AA, older than stated age,  weak appearing. HEENT:Oral mucosa moist, Ear/Nose WNL grossly, dentition normal. Respiratory system: bilaterally diminished, no use of accessory muscle Cardiovascular system: S1 & S2 +, No JVD,. Gastrointestinal  system: Abdomen soft,NT,ND,BS+ Nervous System:Alert, awake, moving extremities and grossly nonfocal Extremities: LE ankle edema neg, distal peripheral pulses palpable.  Skin: No rashes,no icterus. MSK: Normal muscle bulk,tone, power   Data Reviewed: I have independently reviewed following labs and imaging studies  No results found for this or any previous visit (from the past 240 hour(s)).   Radiology Studies: No results found.  Antonieta Pert MD [facility]  Between 7 pm - 7 am I am not available, please contact night coverage MD/APP via Amion

## 2022-05-12 NOTE — TOC Progression Note (Signed)
Transition of Care (TOC) - Progression Note    Patient Details  Name: Gregory [name]. MRN: [id number] Date of Birth: 12-Aug-1956  Transition of Care [facility]) CM/SW Contact  Ross Ludwig, [place] Phone Number: 05/12/2022, 1:33 PM  Clinical Narrative:     CSW spoke to [facility] on call Marchia Meiers [phone].  She said they do not have any beds available for patient today.  Per Dr. Hilma Favors in palliative, she spoke with Tammy at [facility] and informed her that patient would be for GIP symptom management.  CSW explained to [facility] and she is aware of situation with patient now.  Marchia Meiers will call this CSW if bed is available.  CSW to continue to follow patient's progress throughout discharge planning.   Expected Discharge Plan: [place] Barriers to Discharge: Continued Medical Work up, SNF Pending bed offer, Ship broker  Expected Discharge Plan and Services Expected Discharge Plan: [place] arrangements for the past 2 months: Single Family Home                                       Social Determinants of Health (SDOH) Interventions    Readmission Risk Interventions     No data to display

## 2022-05-13 DIAGNOSIS — D497 Neoplasm of unspecified behavior of endocrine glands and other parts of nervous system: Secondary | ICD-10-CM | POA: Diagnosis not present

## 2022-05-13 NOTE — Progress Notes (Signed)
PROGRESS NOTE  Gregory [name]. AST:[id number] DOB: Apr 09, 1956 DOA: 04/17/2022 PCP: Pcp, No   LOS: 25 days   Brief Narrative / Interim history: 66 year old man PMH metastatic renal cell cancer with widespread bony metastasis to the spine, currently undergoing radiation therapy for known epidural tumor, undergoing chemotherapy, presented with worsening left leg weakness to any pain.  After discussion with his oncologist, plan was made to transfer to [facility] for radiation therapy.  Patient completed XRT and is medically optimized for DC to SNF (since 04/25/2022) but insurance denied SNF admission even after completing peer to peer review with Psychologist, occupational.  Patient/family appealed the denial which was upheld again.  A third-party, [employer], reviewed and he is still denied.  Palliative care consulted, now looking into placement to [facility] for symptom control.  Subjective / 24h Interval events: Seen and examined alert awake no new complaints.   Waiting for bed offers. 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  Epidural disease present L4 Renal cell carcinoma w/ Metastatic disease to bones including, mets to lungs, liver-no further treatment options available: Leg weakness due to metastatic disease.Patient completed XRT on 04/24/2022 and is medically optimized for DC to SNF (since 04/25/2022) but insurance denied SNF despite peer to peer review  with Psychologist, occupational, and was denied on family appeal as well as after third-party review.  Palliative care following with patient's ongoing pain issues, poor oral intake and after oncology input planning for hospice care.  Awaiting for placement.  Family wants symptom management in the hospice setting.  Continue Decadron per Gregory [name].  NM whole-body scan 8/24 with progressive osseous metastatic disease since 5/30.  Patient with ongoing functional decline, continue DNR/DNI.  Has increasing symptom burden and high risk of worsening cord compression.  At this time waiting for SNF bed offers for symptom management  Anemia of metastatic disease/CKD Port-A-Cath in place  Tobacco use : Continue NicoDerm.   HIV screen reactive spurious,confirmatory test negative   DVT prophylaxis: heparin injection 5,000 Units Start: 04/17/22 1400 TED hose Start: 04/17/22 1336 SCDs Start: 04/17/22 1336  Lab Results  Component Value Date   PLT 217 04/26/2022    Code Status: DNR Status is: Inpatient Remains inpatient appropriate because: Waiting on placement at [place] Level of care: Med-Surg  Objective: Vitals:   05/12/22 0410 05/12/22 1329 05/12/22 1955 05/13/22 0436  BP: 102/67 107/77 104/82 116/78  Pulse: 73 79 84 84  Resp: '14 18 18 18  '$ Temp: 97.7 F (36.5 C) 98.7 F (37.1 C) 98.5 F (36.9 C) 98.4 F (36.9 C)  TempSrc: Oral Oral Oral Oral  SpO2: 100% 99% 97% 95%  Weight: 49.5 kg     Height:      Examination: General exam: AA, thin, debilitated, older than stated age, weak appearing. HEENT:Oral mucosa moist, Ear/Nose WNL grossly, dentition normal. Respiratory system: bilaterally diminished, no use of accessory muscle Cardiovascular system: S1 & S2 +, No JVD,. Gastrointestinal system: Abdomen soft,NT,ND,BS+ Nervous System:Alert, awake, moving  extremities and grossly nonfocal Extremities: LE ankle edema neg, weak LLE Skin: No rashes,no icterus. MSK: Normal muscle bulk,tone, power    Data Reviewed: I have independently reviewed following labs and imaging studies  No results found for this or any previous visit (from the past 240 hour(s)).   Radiology Studies: No results found.  Antonieta Pert MD [facility]  Between 7 pm - 7 am I am not available, please contact night coverage MD/APP via Amion

## 2022-05-13 NOTE — TOC Progression Note (Signed)
Transition of Care (TOC) - Progression Note    Patient Details  Name: Gregory [name]. MRN: [id number] Date of Birth: [birth year]  Transition of Care [facility]) CM/SW Contact  Ross Ludwig, [place] Phone Number: 05/13/2022, 3:16 PM  Clinical Narrative:     CSW spoke to [facility], still no beds available at [facility] [place] continuing to follow patient's progress throughout discharge planning.   Expected Discharge Plan: [place] Barriers to Discharge: Continued Medical Work up, SNF Pending bed offer, Ship broker  Expected Discharge Plan and Services Expected Discharge Plan: [place] arrangements for the past 2 months: Single Family Home                                       Social Determinants of Health (SDOH) Interventions    Readmission Risk Interventions     No data to display

## 2022-05-14 DIAGNOSIS — D497 Neoplasm of unspecified behavior of endocrine glands and other parts of nervous system: Secondary | ICD-10-CM | POA: Diagnosis not present

## 2022-05-14 NOTE — Progress Notes (Signed)
Pt refusing dressing change this morning, wife is not at bedside today. Will inform oncoming shift.

## 2022-05-14 NOTE — Progress Notes (Signed)
PROGRESS NOTE  Gregory [name]. HGD:[id number] DOB: 06-May-1956 DOA: 04/17/2022 PCP: Pcp, No   LOS: 26 days   Brief Narrative / Interim history: 66 year old man PMH metastatic renal cell cancer with widespread bony metastasis to the spine, currently undergoing radiation therapy for known epidural tumor, undergoing chemotherapy, presented with worsening left leg weakness to any pain.  After discussion with his oncologist, plan was made to transfer to [facility] for radiation therapy.  Patient completed XRT and is medically optimized for DC to SNF (since 04/25/2022) but insurance denied SNF admission even after completing peer to peer review with Psychologist, occupational.  Patient/family appealed the denial which was upheld again.  A third-party, [employer], reviewed and he is still denied.  Palliative care consulted, now looking into placement to [facility] for symptom control.  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  Epidural disease L4 Renal cell carcinoma w/ Metastatic disease to bones including, mets to lungs, liver-no further treatment options available: Leg weakness due to metastatic disease.Patient completed XRT on 04/24/2022 and is medically optimized for DC to SNF (since 04/25/2022) but  insurance denied SNF despite peer to peer review with Psychologist, occupational, and was denied on family appeal as well as after third-party review.  Palliative care following with patient's ongoing pain issues, poor oral intake and after oncology input planning for hospice care.  Awaiting for placement.  Family wants symptom management in the hospice setting.  Continue Decadron per Delton Coombes.  NM whole-body scan 8/24 with progressive osseous metastatic disease since 5/30.  Patient with ongoing functional decline, continue DNR/DNI.  Has increasing symptom burden and high risk of worsening cord compression.  At this time waiting for SNF bed offers for symptom management  Anemia of metastatic disease/CKD Port-A-Cath in place  Tobacco use : Continue NicoDerm.   HIV screen reactive spurious,confirmatory test negative   DVT prophylaxis: heparin injection 5,000 Units Start: 04/17/22 1400 TED hose Start: 04/17/22 1336 SCDs Start: 04/17/22 1336  Lab Results  Component Value Date   PLT 217 04/26/2022    Code Status: DNR Status is: Inpatient Remains inpatient appropriate because: Waiting on placement at [place] Level of care: Med-Surg  Objective: Vitals:   05/13/22 1334 05/13/22 2031 05/14/22 0605 05/14/22 1343  BP: 101/64 107/70 108/76 102/67  Pulse: 91 80 87 95  Resp: '16 18 18 16  '$ Temp: 98.8 F (37.1 C) 98.3 F (36.8 C) 98 F (36.7 C) 98.8 F (37.1 C)  TempSrc: Oral Oral Oral   SpO2: 95% 94% 96% 96%  Weight:      Height:       Examination: General exam:AAox3,older than stated age, weak appearing. HEENT:Oral mucosa moist, Ear/Nose WNL grossly, dentition normal. Respiratory system: bilaterally clear,no use of accessory muscle Cardiovascular system: S1 & S2 +, No JVD. Gastrointestinal system: Abdomen soft,NT,ND,BS+ Nervous System:Alert, awake, moving  RLE Extremities: LE ankle edema neg, distal peripheral pulses palpable.  Skin: No rashes,no icterus. MSK: Normal muscle  bulk,tone, power.   Data Reviewed: I have independently reviewed following labs and imaging studies  No results found for this or any previous visit (from the past 240 hour(s)).   Radiology Studies: No results found.  Antonieta Pert MD [facility]  Between 7 pm - 7 am I am not available, please contact night coverage MD/APP via Amion

## 2022-05-14 NOTE — Progress Notes (Signed)
Daily Progress Note   Patient Name: Gregory [name].       Date: 05/14/2022 DOB: [birth year]  Age: 66 y.o. MRN#: [id number] Attending Physician: Antonieta Pert, MD Primary Care Physician: Pcp, No Admit Date: 04/17/2022  Reason for Consultation/Follow-up: Establishing goals of care  Subjective: Patient is resting in bed, in no distress, chart reviewed.       Length of Stay: 26  Current Medications: Scheduled Meds:   celecoxib  200 mg Oral BID   dexamethasone  4 mg Oral BID BM   feeding supplement  237 mL Oral BID BM   gabapentin  100 mg Oral TID   heparin  5,000 Units Subcutaneous Q8H   nicotine  14 mg Transdermal Daily   senna-docusate  1 tablet Oral BID   sodium chloride flush  3 mL Intravenous Q12H   tamsulosin  0.4 mg Oral QPC supper    Continuous Infusions:   PRN Meds: acetaminophen **OR** acetaminophen, bisacodyl, hydrALAZINE, HYDROmorphone (DILAUDID) injection, ipratropium, levalbuterol, LORazepam, ondansetron **OR** ondansetron (ZOFRAN) IV, oxyCODONE, phenol, polyethylene glycol, senna-docusate, sodium phosphate, traZODone  Physical Exam         Weak appearing gentleman resting in bed Shallow regular work of breathing Abdomen not distended Diffuse back pain  Vital Signs: BP 108/76 (BP Location: Right Arm)   Pulse 87   Temp 98 F (36.7 C) (Oral)   Resp 18   Ht '5\' 7"'$  (1.702 m)   Wt 49.5 kg   SpO2 96%   BMI 17.09 kg/m  SpO2: SpO2: 96 % O2 Device: O2 Device: Room Air O2 Flow Rate:    Intake/output summary:  Intake/Output Summary (Last 24 hours) at 05/14/2022 1216 Last data filed at 05/14/2022 6948 Gross per 24 hour  Intake 120 ml  Output 750 ml  Net -630 ml    LBM: Last BM Date : 05/14/22 Baseline Weight: Weight: 52 kg Most recent weight: Weight:  49.5 kg       Palliative Assessment/Data:      Patient Active Problem List   Diagnosis Date Noted   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   L4 spinal cord injury ([place]) 04/17/2022   Left leg weakness 04/17/2022   Port-A-Cath in place 02/04/2022   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 ([place]) 01/19/2022   History of malignant neoplasm metastatic to brain  10/28/2020   Tobacco use disorder 06/18/2016   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  After discussion with his oncologist, plan was made to transfer to [facility] for radiation therapy.  Patient completed XRT, remains admitted to [facility] service, urinary retention, has extensive metastatic disease throughout the cervical spine with pathologic fracture and osseous retropulsion at C6 with epidural paraspinal tumor causing cord compression (chronic spinal cord injury).  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  Epidural disease present L4.    Recommendations/Plan: DNR Await transfer to hospice facility. Discussed with TOC and wife on weekend.    Goals of Care and Additional Recommendations: Hospice evaluation.   Code Status:    Code Status Orders  (From admission, onward)           Start     Ordered   05/07/22 1042  Do not attempt resuscitation (DNR)  Continuous       Question Answer Comment  In the event of cardiac or respiratory ARREST Do not call a "code blue"   In the event of cardiac or respiratory ARREST Do not perform Intubation, CPR, defibrillation or ACLS    In the event of cardiac or respiratory ARREST Use medication by any route, position, wound care, and other measures to relive pain and suffering. May use oxygen, suction and manual treatment of airway obstruction as needed for comfort.      05/07/22 1041           Code Status History     Date Active Date Inactive Code Status Order ID Comments User Context   04/17/2022 1338 05/07/2022 1041 Full Code [id number]  [name], MD ED       Prognosis:  Few weeks.   Discharge Planning: Hospice facility  Care plan was discussed with IDT  Thank you for allowing the Palliative Medicine Team to assist in the care of this patient.  Low MDM.  Greater than 50%  of this time was spent counseling and coordinating care related to the above assessment and plan.  Loistine Chance, MD  Please contact Palliative Medicine Team phone at [phone] for questions and concerns.

## 2022-05-15 DIAGNOSIS — D497 Neoplasm of unspecified behavior of endocrine glands and other parts of nervous system: Secondary | ICD-10-CM | POA: Diagnosis not present

## 2022-05-15 MED ORDER — OXYCODONE HCL ER 20 MG PO T12A
20.0000 mg | EXTENDED_RELEASE_TABLET | Freq: Two times a day (BID) | ORAL | Status: DC
  Administered 2022-05-15 – 2022-05-18 (×6): 20 mg via ORAL
  Filled 2022-05-15 (×6): qty 1

## 2022-05-15 NOTE — TOC Progression Note (Addendum)
Transition of Care (TOC) - Progression Note    Patient Details  Name: Gregory [name]. MRN: [id number] Date of Birth: Jun 15, 1956  Transition of Care [facility]) CM/SW Contact  Leeroy Cha, RN Phone Number: 05/15/2022, 10:13 AM  Clinical Narrative:    [name] at [place] beds has been denied.due to no place to go after symptom management.  Was first approved due to 2 week life span which he has surpassed.  [place] alerts. [id number]/[facility] to see if bed available [id number]/1332/[name] does not meet inpatient hospice but will follow if admitted to a skilled nursing facility. Expected Discharge Plan: [place] Barriers to Discharge: Continued Medical Work up, SNF Pending bed offer, Ship broker  Expected Discharge Plan and Services Expected Discharge Plan: [place] arrangements for the past 2 months: Single Family Home                                       Social Determinants of Health (SDOH) Interventions    Readmission Risk Interventions   No data to display

## 2022-05-15 NOTE — Progress Notes (Signed)
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  After discussion with his oncologist, plan was made to transfer to [facility] for radiation therapy.  Patient completed XRT, remains admitted to [facility] service, urinary retention, has extensive metastatic disease throughout the cervical spine with pathologic fracture and osseous retropulsion at C6 with epidural paraspinal tumor causing cord compression (chronic spinal cord injury).  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 Epidural disease present L4.   He is resting comfortably when I arrived to the room. [name] and his sister Jenny Reichmann are at bedside-his sister is having a hard time with her grief. They all want to be his caregiver but are unable to do this due to their own life circumstances-they are however able to talk about this openly and agree that he needs a care facility at least until [place] finished her treatment.  Assessment:  He is currently receiving 50-60 mg of oxycodone PRNs daily- the maximum he has ordered.  Doing well with combination of steroids and Celebrex. He is basically confined to the bed due to pain and instability related to spinal cord compression. He completed radiation tx. His goal is to be able to get from the bed to a wheelchair and needs better pain control in order to do this. Prognosis is < 6 months and he is hospice appropriate given that he is no longer a candidate for systemic treatment and the aggressive metastatic disease in his spine. Depending on his mobility and nutrition this may be much less-he is very weak and fragile and prone to infection.     I had previously recommended that he go to to hospice house for ongoing symptom management related to his pain from  his vertebral disease maximize his quality of life before going to a long-term care facility where they are less equipped to handle his level of care and needs related to his cancer pain.  This referral was made over a week ago however there has been confusion about whether or not he qualifies for general inpatient hospice care, I spoke directly with [facility] hospice to explain his situation and they seem to understand at the time I spoke with them a week ago.  There is apparently not been bed availability so he remains in the hospital or inpatient care.    Plan for now is for him to go to [facility] with hospice to follow there.   Recommendations:  DNR LTC w/ [facility]  Started Long Acting Oxycodone ER '20mg'$  BID -continue q4prn breakthrough Continue Decadron and Celebrex GI Protection PPI H2 B Mobilize as tolerated Monitor for urine retension and constipation-at risk for both.   Lane Hacker, DO Palliative Medicine   Time: 55 min

## 2022-05-15 NOTE — Progress Notes (Signed)
Patient [id number] Gregory [name].      DOB: Jan 10, 1956      QDI:[id number]      Palliative Medicine Team    Subjective: Bedside symptom check completed. No family or visitors bedside at this time.    Physical exam: Patient resting in bed with eyes closed at time of visit. Breathing even and non-labored, no excessive secretions noted. Patient without physical or non-verbal signs of pain or discomfort at this time. This RN did not attempt to wake patient to assess responsiveness, to promote comfort.    Assessment and plan: At this time patient remains stable for transfer when bed offered from [facility] residential hospice facility. Bedside RN without needs or concerns this morning. Will continue to follow for any changes or advances.    Thank you for allowing the Palliative Medicine Team to assist in the care of this patient.     Damian Leavell, MSN, RN Palliative Medicine Team Team Phone: [phone]  This phone is monitored 7a-7p, please reach out to attending physician outside of these hours for urgent needs.

## 2022-05-16 DIAGNOSIS — D497 Neoplasm of unspecified behavior of endocrine glands and other parts of nervous system: Secondary | ICD-10-CM | POA: Diagnosis not present

## 2022-05-16 MED ORDER — ENOXAPARIN SODIUM 30 MG/0.3ML IJ SOSY
30.0000 mg | PREFILLED_SYRINGE | INTRAMUSCULAR | Status: DC
Start: 2022-05-16 — End: 2022-05-20
  Administered 2022-05-16 – 2022-05-19 (×4): 30 mg via SUBCUTANEOUS
  Filled 2022-05-16 (×4): qty 0.3

## 2022-05-16 MED ORDER — OXYCODONE HCL ER 20 MG PO T12A: 20.0000 mg | EXTENDED_RELEASE_TABLET | Freq: Two times a day (BID) | ORAL | 0 refills | Status: DC

## 2022-05-16 MED ORDER — POLYETHYLENE GLYCOL 3350 17 G PO PACK
17.0000 g | PACK | Freq: Every day | ORAL | Status: DC
Start: 2022-05-17 — End: 2022-05-18
  Administered 2022-05-17 – 2022-05-18 (×2): 17 g via ORAL
  Filled 2022-05-16 (×2): qty 1

## 2022-05-16 MED ORDER — OXYCODONE HCL 10 MG PO TABS: 10.0000 mg | ORAL_TABLET | ORAL | 0 refills | Status: AC | PRN

## 2022-05-16 MED ORDER — LORAZEPAM 1 MG PO TABS: 1.0000 mg | ORAL_TABLET | Freq: Every evening | ORAL | 0 refills | Status: AC | PRN

## 2022-05-16 NOTE — Progress Notes (Signed)
PROGRESS NOTE  Gregory [name]. 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   After discussion with his oncologist, plan was made to transfer to [facility] for radiation therapy.  Patient completed XRT and is medically optimized for DC to SNF (since 04/25/2022) but insurance denied SNF admission even after completing peer to peer review with Psychologist, occupational.  Patient/family appealed the denial which was upheld again.  A third-party, [employer], reviewed and he is still denied.  Waiting on placement.  Pain management optimized.  Subjective / 24h Interval events:  Seen and examined.  His family is touring facilities and hopefully he can go to one of them. 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  Epidural disease L4 Renal cell carcinoma w/ Metastatic disease to bones including, mets to lungs, liver-no further treatment options available: Leg weakness due to metastatic disease.Patient completed XRT on 04/24/2022 and is medically optimized for DC to SNF (since 04/25/2022) but insurance denied SNF despite peer to peer review with Psychologist, occupational, and was denied on family  appeal as well as after third-party review.  Palliative care following with patient's ongoing pain issues, poor oral intake and after oncology input planning for hospice care.  Awaiting for placement.  Family wants symptom management in the hospice setting.  Continue Decadron per Gregory [name].  NM whole-body scan 8/24 with progressive osseous metastatic disease since 5/30.  Patient with ongoing functional decline, continue DNR/DNI.  Has increasing symptom burden and high risk of worsening cord compression.  At this time waiting for SNF bed offers for symptom management  Anemia of metastatic disease/CKD Port-A-Cath in place  Tobacco use : Continue NicoDerm.   HIV screen reactive spurious,confirmatory test negative   Disposition: SNF with hospice. Discussed with case worker/Dr. Hilma [name]: Waiting for long-term care bed.  Concurrently working with hospice provider.  DVT prophylaxis: heparin injection 5,000 Units Start: 04/17/22 1400 TED hose Start: 04/17/22 1336 SCDs Start: 04/17/22 1336  Lab Results  Component Value Date   PLT 217 04/26/2022    Code Status: DNR Status is: Inpatient Remains inpatient appropriate because: Waiting on placement.  Medically stable. Level of care: Med-Surg  Objective: Vitals:   05/15/22 1408 05/15/22 2303 05/16/22 0650 05/16/22 0700  BP: 105/67 (!) 94/59 107/72   Pulse: 83 85 83   Resp: '18 18 18   '$ Temp: 98.5 F (36.9 C) 98.3 F (36.8 C) 98.2 F (36.8 C)   TempSrc: Oral Oral Oral   SpO2: 99% 99% 98%   Weight:    49.3 kg  Height:       Examination: General exam: AA, older than stated age, weak appearing. HEENT:Oral mucosa moist, Ear/Nose WNL grossly, dentition normal. Respiratory system: bilaterally diminished, no use of accessory muscle Cardiovascular system: S1 &  S2 +, No JVD,. Gastrointestinal system: Abdomen soft,NT,ND,BS+ Nervous System:Alert, awake, moving extremities and grossly nonfocal Extremities: LE ankle edema neg, distal peripheral pulses  palpable.  Skin: No rashes,no icterus. MSK: Normal muscle bulk,tone, power   Data Reviewed: I have independently reviewed following labs and imaging studies  No results found for this or any previous visit (from the past 240 hour(s)).   Radiology Studies: No results found.  Gregory Pert MD [facility]

## 2022-05-17 DIAGNOSIS — D497 Neoplasm of unspecified behavior of endocrine glands and other parts of nervous system: Secondary | ICD-10-CM | POA: Diagnosis not present

## 2022-05-17 NOTE — Progress Notes (Signed)
PROGRESS NOTE  Gregory [name]. 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  After discussion with his oncologist, plan was made to transfer to [facility] for radiation therapy.  Patient completed XRT and is medically optimized for DC to SNF (since 04/25/2022) but insurance denied SNF admission even after completing peer to peer review with Psychologist, occupational.  Patient/family appealed the denial which was upheld again.  A third-party, [employer], reviewed and he is still denied.  Waiting on placement.  Pain management optimized today and followed by palliative care.   Subjective / 24h Interval events: Patient seen and examined.  No new events.  He tells me his pain is controlled on current regimen.  He has only used 3 doses of short acting oxycodone since last 24 hours. His last dose of lorazepam was about a month ago.  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  Epidural disease L4 Renal cell carcinoma w/ Metastatic disease to bones including, mets to lungs, liver-no further treatment options available: Leg weakness due to metastatic disease.Patient  completed XRT on 04/24/2022 and is medically optimized for DC to SNF (since 04/25/2022) but insurance denied SNF despite peer to peer review with Psychologist, occupational, and was denied on family appeal as well as after third-party review.  Palliative care following with patient's ongoing pain issues, poor oral intake and after oncology input planning for hospice care.  Awaiting for placement.  Family wants symptom management in the hospice setting.  Continue Decadron per Delton Coombes.  NM whole-body scan 8/24 with progressive osseous metastatic disease since 5/30.  Patient with ongoing functional decline, continue DNR/DNI.  Has increasing symptom burden and high risk of worsening cord compression.  At this time waiting for SNF bed offers for symptom management  Anemia of metastatic disease/CKD Port-A-Cath in place  Tobacco use : Continue NicoDerm.   HIV screen reactive spurious,confirmatory test negative   Disposition: SNF with hospice. Discussed with case worker/Dr. Hilma Favors: Waiting for long-term care bed.  Concurrently working with hospice provider.  DVT prophylaxis: enoxaparin (LOVENOX) injection 30 mg Start: 05/16/22 1800 TED hose Start: 04/17/22 1336 SCDs Start: 04/17/22 1336  Lab Results  Component Value Date   PLT 217 04/26/2022    Code Status: DNR Status is: Inpatient Remains inpatient appropriate because: Waiting on placement.  Medically stable. Level of care: Med-Surg  Objective: Vitals:   05/16/22 1530 05/16/22 2232 05/17/22 0558 05/17/22 0700  BP: (!) 89/57 108/68 (!) 89/67   Pulse: 80 80 73   Resp: '16 18 16   '$ Temp: 98 F (36.7 C) 98.8 F (37.1 C) 98.1 F (36.7 C)   TempSrc: Oral Oral Oral   SpO2: 94% 99% 98%   Weight:    50.1 kg  Height:       Examination: Looks comfortable.  Data Reviewed: I have independently reviewed following labs and imaging studies  No results found for this or any previous visit (from the past 240 hour(s)).   Radiology Studies: No  results found.

## 2022-05-17 NOTE — Progress Notes (Signed)
Palliative Care Progress Note  66 year old gentleman with metastatic renal cell cancer with widespread bony metastasis, significant lesions affecting his C-spine, T-spine and L-spine.  He is status post radiation, high-dose steroid administration but continues to have lesions that create higher risk for spinal cord compression acute skeletal related events.  Palliative care was consulted for goals of care and for symptom management.  A decision has been made by collaborative effort with oncology that the patient will no longer be eligible for systemic chemotherapy as it has not been effective and he is experienced disease progression on his last line of therapy.   A discussion was had with patient regarding hospice services and the palliative care team has been supporting in his discharge plan.  See prior palliative care notes documenting discussions around discharge with hospice care.  I spoke with [facility] of [place] regarding our referral and our request for their services-they did not believe he was yet a candidate for [place] for end-of-life care and I agree that he probably has a greater than 2-week prognosis, the request for is in house was for symptom management for his bony metastasis and spinal cord compression.  He needs a facility short-term for respite and for symptom management, his primary caregiver is [name] who is his significant other and she is currently receiving medical treatments to prevent her from caring for him at this time.  The patient understands the situation and is open to placement in a long-term care bed with hospice following him.  Yesterday the family went to [facility] in [place] but found the facility conditions to not be acceptable.  I placed a call to the director of admissions at [facility] who was very helpful in discussing options for him at their center, they do have beds available.  Are open to taking a patient who is also served by hospice care  and have contract with both of [place] and with [facility].  I requested that care management and appropriate paperwork and referral over to [facility] to assist the family in making contact with them to secure a bed for him.  As of this afternoon, the [facility] is willing to take this patient-need for care management to assist this family in making those arrangements.  Assessment and Recommendations:  Discharged to [facility] for long-term care with hospice Yesterday I increased his pain regimen.  I have placed him on long-acting oxycodone 20 mg twice a day, and I have continued his every 4 hour oxycodone for breakthrough pain.  Since this morning he has not required any additional as needed oxycodone however he tells me that he feels like his pain is starting to increase.  He still will not get out of the bed he feels like his pain will be too severe and tells me her knows he cannot support his own weight.  Will need to monitor him for skin breakdown. Prognostically difficult to predict his trajectory. He is talkative and engaged but mostly confined to his bed-family and patient hope for more quality and also time.  Will continue to follow and support the discharge plan.  Lane Hacker, DO Palliative Medicine  Time: 55 min

## 2022-05-17 NOTE — TOC Progression Note (Addendum)
Transition of Care (TOC) - Progression Note    Patient Details  Name: Gregory [name]. MRN: [id number] Date of Birth: 1955-11-12  Transition of Care [facility]) CM/SW Contact  Leeroy Cha, RN Phone Number: 05/17/2022, 1:01 PM  Clinical Narrative:     [facility] will not accept patients insurance.  Nor will they accept an log.pt is unvaccinated against covid  Expected Discharge Plan: [place] Barriers to Discharge: Continued Medical Work up, SNF Pending bed offer, Ship broker  Expected Discharge Plan and Services Expected Discharge Plan: [place] arrangements for the past 2 months: Single Family Home                                       Social Determinants of Health (SDOH) Interventions    Readmission Risk Interventions   No data to display

## 2022-05-18 DIAGNOSIS — D497 Neoplasm of unspecified behavior of endocrine glands and other parts of nervous system: Secondary | ICD-10-CM | POA: Diagnosis not present

## 2022-05-18 MED ORDER — DEXAMETHASONE 4 MG PO TABS
8.0000 mg | ORAL_TABLET | Freq: Every day | ORAL | Status: DC
  Administered 2022-05-18 – 2022-05-20 (×3): 8 mg via ORAL
  Filled 2022-05-18 (×3): qty 2

## 2022-05-18 MED ORDER — OXYCODONE HCL 5 MG PO TABS
30.0000 mg | ORAL_TABLET | ORAL | Status: AC
  Administered 2022-05-18: 30 mg via ORAL
  Filled 2022-05-18: qty 6

## 2022-05-18 MED ORDER — OXYCODONE HCL ER 20 MG PO T12A
20.0000 mg | EXTENDED_RELEASE_TABLET | Freq: Three times a day (TID) | ORAL | Status: DC
  Administered 2022-05-18 – 2022-05-20 (×6): 20 mg via ORAL
  Filled 2022-05-18 (×7): qty 1

## 2022-05-18 MED ORDER — POLYETHYLENE GLYCOL 3350 17 G PO PACK
17.0000 g | PACK | Freq: Every day | ORAL | Status: DC
  Administered 2022-05-18 – 2022-05-20 (×3): 17 g via ORAL
  Filled 2022-05-18 (×3): qty 1

## 2022-05-18 NOTE — TOC Progression Note (Signed)
Transition of Care (TOC) - Progression Note    Patient Details  Name: Gregory [name]. MRN: [id number] Date of Birth: 1956/04/05  Transition of Care [facility]) CM/SW Contact  Joaquin Courts, RN Phone Number: 05/18/2022, 9:24 AM  Clinical Narrative:    VM left for admissions at [facility] regarding patient referral, patient's daughter indicates this is their next facility of choice, awaiting response from rep to see if a bed offer can be made.    Expected Discharge Plan: [place] Barriers to Discharge: Continued Medical Work up, SNF Pending bed offer, Ship broker  Expected Discharge Plan and Services Expected Discharge Plan: [place] arrangements for the past 2 months: Single Family Home                                       Social Determinants of Health (SDOH) Interventions    Readmission Risk Interventions     No data to display

## 2022-05-18 NOTE — TOC Progression Note (Addendum)
Transition of Care (TOC) - Progression Note    Patient Details  Name: Gregory [name]. MRN: [id number] Date of Birth: [birth year]  Transition of Care [facility]) CM/SW Contact  Joaquin Courts, RN Phone Number: 05/18/2022, 5:02 PM  Clinical Narrative:    [facility] has declined to offer the patient a bed.  Have updated patient and daughter.  CM discussed next steps, and patient and significant other Tye [place] ask that CM contact [facility].  Cm spoke with [facility] rehab rep who extended a bed offer.  This was presented to patient who accepts the offer and plans to call his daughter to make her aware as well.  TOC will continue to follow to coordinate discharge.  [facility] rehab is aware that [facility] has denied the insurance auth.  Patient to admit at [facility] pending.    Expected Discharge Plan: [place] Barriers to Discharge: Continued Medical Work up, SNF Pending bed offer, Ship broker  Expected Discharge Plan and Services Expected Discharge Plan: [place] arrangements for the past 2 months: Single Family Home                                       Social Determinants of Health (SDOH) Interventions    Readmission Risk Interventions     No data to display

## 2022-05-18 NOTE — Progress Notes (Signed)
PROGRESS NOTE  Gregory [name]. 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  After discussion with his oncologist, plan was made to transfer to [facility] for radiation therapy.  Patient completed XRT and is medically optimized for DC to SNF (since 04/25/2022) but insurance denied SNF admission even after completing peer to peer review with Psychologist, occupational.  Patient/family appealed the denial which was upheld again.  A third-party, [employer], reviewed and he is still denied.  Waiting on placement.  Pain management optimized today and followed by palliative care.   Subjective / 24h Interval events: No new events.  Pain is controlled with current regimen.  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  Epidural disease L4 Renal cell carcinoma w/ Metastatic disease to bones including, mets to lungs, liver-no further treatment options available: Leg weakness due to metastatic disease.Patient completed XRT on 04/24/2022 and is medically optimized for DC to SNF (since 04/25/2022) but insurance denied SNF despite peer to peer review with Psychologist, occupational,  and was denied on family appeal as well as after third-party review.  Palliative care following with patient's ongoing pain issues, poor oral intake and after oncology input planning for hospice care.  Awaiting for placement.  Family wants symptom management in the hospice setting.  Continue Decadron per Delton Coombes.  NM whole-body scan 8/24 with progressive osseous metastatic disease since 5/30.  Patient with ongoing functional decline, continue DNR/DNI.  Has increasing symptom burden and high risk of worsening cord compression.  At this time waiting for SNF bed offers for symptom management  Anemia of metastatic disease/CKD Port-A-Cath in place  Tobacco use : Continue NicoDerm.   HIV screen reactive spurious,confirmatory test negative   Disposition: SNF with hospice. Discussed with case worker/Dr. Hilma Favors: Waiting for long-term care bed.  Concurrently working with hospice provider.  DVT prophylaxis: enoxaparin (LOVENOX) injection 30 mg Start: 05/16/22 1800 TED hose Start: 04/17/22 1336 SCDs Start: 04/17/22 1336  Lab Results  Component Value Date   PLT 217 04/26/2022    Code Status: DNR Status is: Inpatient Remains inpatient appropriate because: Waiting on placement.  Medically stable. Level of care: Med-Surg  Objective: Vitals:   05/17/22 1341 05/17/22 2101 05/18/22 0430 05/18/22 0652  BP: 115/72 126/77 115/75   Pulse: 85 84 87   Resp: '18 16 16   '$ Temp: 98.7 F (37.1 C) 98.7 F (37.1 C) 98.5 F (36.9 C)   TempSrc: Oral Oral Oral   SpO2: 98% 100% 97%   Weight:    49 kg  Height:       Examination: Looks comfortable.  Sleeping on arrival.  Data Reviewed: I have independently reviewed following labs and imaging studies  No results found for this or any previous  visit (from the past 240 hour(s)).   Radiology Studies: No results found.

## 2022-05-19 DIAGNOSIS — D497 Neoplasm of unspecified behavior of endocrine glands and other parts of nervous system: Secondary | ICD-10-CM | POA: Diagnosis not present

## 2022-05-19 MED ORDER — POLYETHYLENE GLYCOL 3350 17 G PO PACK: 17.0000 g | PACK | Freq: Every day | ORAL | 0 refills | Status: AC

## 2022-05-19 MED ORDER — CELECOXIB 200 MG PO CAPS: 200.0000 mg | ORAL_CAPSULE | Freq: Two times a day (BID) | ORAL | Status: DC

## 2022-05-19 MED ORDER — ACETAMINOPHEN 325 MG PO TABS: 650.0000 mg | ORAL_TABLET | Freq: Four times a day (QID) | ORAL | Status: AC | PRN

## 2022-05-19 MED ORDER — TAMSULOSIN HCL 0.4 MG PO CAPS: 0.4000 mg | ORAL_CAPSULE | Freq: Every day | ORAL | Status: AC

## 2022-05-19 MED ORDER — NICOTINE 14 MG/24HR TD PT24: 14.0000 mg | MEDICATED_PATCH | Freq: Every day | TRANSDERMAL | 0 refills | Status: AC

## 2022-05-19 MED ORDER — OXYCODONE HCL ER 20 MG PO T12A
20.0000 mg | EXTENDED_RELEASE_TABLET | Freq: Three times a day (TID) | ORAL | 0 refills | Status: AC
Start: 2022-05-19 — End: 2022-05-24

## 2022-05-19 MED ORDER — DEXAMETHASONE 4 MG PO TABS: 8.0000 mg | ORAL_TABLET | Freq: Every day | ORAL | Status: AC

## 2022-05-19 MED ORDER — TRAZODONE HCL 50 MG PO TABS: 50.0000 mg | ORAL_TABLET | Freq: Every evening | ORAL | 0 refills | Status: AC | PRN

## 2022-05-19 NOTE — Progress Notes (Signed)
PROGRESS NOTE  Gregory [name]. [id number]:[id number] DOB: [birth year] DOA: 04/17/2022 PCP: Pcp, No   LOS: 31 days   Brief Narrative / Interim history: 66 year old man PMH metastatic renal cell cancer with widespread bony metastasis to the spine, currently undergoing radiation therapy for known epidural tumor, undergoing chemotherapy, presented with worsening left leg weakness to [facility] hospital.  After discussion with his oncologist, plan was made to transfer to [facility] for radiation therapy.  Patient completed XRT and is medically optimized for DC to SNF (since 04/25/2022) but insurance denied SNF admission even after completing peer to peer review with Psychologist, occupational.  Patient/family appealed the denial which was upheld again.  A third-party, [employer], reviewed and he is still denied.   Waiting on placement.   Pain management by palliative care.    Subjective / 24h Interval events: No new events.  Pain is controlled with current regimen after increasing dose of oxycodone yesterday.  Discussed with Dr. Hilma Favors. Fianc at the bedside. Family visiting the nursing home today.  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  Epidural disease L4 Renal cell carcinoma w/ Metastatic disease to bones including, mets to lungs, liver-no further treatment options available: Leg weakness due to metastatic disease.Patient completed XRT on 04/24/2022 and is medically optimized for  DC. Current pain management on Decadron 8 mg daily Oxycodone 20 mg 3 times daily Short-acting oxycodone 10 mg every 4 hours as needed Celebrex 200 mg twice daily Ativan as needed Awaiting for placement.  Family wants symptom management in the hospice setting.  NM whole-body scan 8/24 with progressive osseous metastatic disease since 5/30.  Patient with ongoing functional decline. Has increasing symptom burden and high risk of worsening cord compression.  At this time waiting for SNF bed offers for symptom management  Anemia of metastatic disease/CKD Port-A-Cath in place  Tobacco use : Continue NicoDerm.   HIV screen reactive spurious,confirmatory test negative   Disposition: SNF with hospice. Discussed with Dr. Hilma Favors: Waiting for long-term care bed.  Concurrently working with hospice provider.  DVT prophylaxis: enoxaparin (LOVENOX) injection 30 mg Start: 05/16/22 1800  Lab Results  Component Value Date   PLT 217 04/26/2022    Code Status: DNR Status is: Inpatient Remains inpatient appropriate because: Waiting on placement.  Medically stable. Level of care: Med-Surg  Objective: Vitals:   05/18/22 1346 05/18/22 2007 05/19/22 0531 05/19/22 0700  BP: 101/65 99/70 104/71   Pulse: 82 84 79   Resp: '16 16 16   '$ Temp: 99.8 F (37.7 C) 98.2 F (36.8 C) 98.3 F (36.8 C)   TempSrc: Oral Oral Oral   SpO2: 95% 96% 94%   Weight:    49.7 kg  Height:       Examination: Looks comfortable.  Parents at the bedside.  Eating breakfast.  Pain is controlled after pain medications earlier today.  Data Reviewed: I have independently reviewed following labs and imaging studies  No results found for this or any previous visit (from the past  240 hour(s)).   Radiology Studies: No results found.

## 2022-05-19 NOTE — Plan of Care (Signed)

## 2022-05-19 NOTE — TOC Progression Note (Addendum)
Transition of Care (TOC) - Progression Note    Patient Details  Name: Gregory [name]. MRN: [id number] Date of Birth: 1956-04-08  Transition of Care [facility]) CM/SW Contact  Emogene Muratalla, Jones Broom, [place] Phone Number: 05/19/2022, 2:52 PM  Clinical Narrative:     CSW received a message that patient and his family wanted to speak to [place] regarding potential discharge to [facility].  CSW met with patient and his family, answered questions and provided plan for disposition.  Patient's family said they toured the SNF and patient with family have agreed to go to facility.  CSW spoke to [place] from [facility], and she said patient has been approved to come to facility with Medicaid Pending.  CSW spoke to patient and his family and updated them, that the bed is available.  Patient plans to discharge tomorrow morning to SNF, CSW explained to them that EMS has been approved and still valid.  CSW updated palliative team, attending physician, and bedside nurse.  CSW to continue to follow patient's progress throughout discharge planning plan for potential discharge tomorrow if medically ready and discharge orders have been received.  Expected Discharge Plan: [place] Barriers to Discharge: Continued Medical Work up, SNF Pending bed offer, Ship broker  Expected Discharge Plan and Services Expected Discharge Plan: [place] arrangements for the past 2 months: Single Family Home                                       Social Determinants of Health (SDOH) Interventions    Readmission Risk Interventions     No data to display

## 2022-05-19 NOTE — Progress Notes (Signed)
Patient seen and evaluated at bedside and care plan discussed at length with both patient and [name]. He has a bed at [place] and is willing to go there is his family checks it out and there are no issues. He is anxious about the move, still having pain control issues.   Increase Oxycodone ER to '20mg'$  TID Continue Oxycodone IR '10mg'$  q4 PRN for breakthrough pain Continue Decadron '8mg'$  daily for bone mets pain Continue Celebrex for bone mets pain Continue PRN ativan for sleep (hasnt been taking but he knowns it is available)  Transfer to [place] with [place] to follow if all goes well with the family visit to facility.  Lane Hacker, DO Palliative Medicine  Time: 35 min

## 2022-05-20 DIAGNOSIS — D497 Neoplasm of unspecified behavior of endocrine glands and other parts of nervous system: Secondary | ICD-10-CM | POA: Diagnosis not present

## 2022-05-20 NOTE — TOC Transition Note (Addendum)
Transition of Care [facility]) - CM/SW Discharge Note   Patient Details  Name: Gregory [name]. MRN: [id number] Date of Birth: 1955/10/21  Transition [facility] Care [facility]) CM/SW Contact:  Ross Ludwig, LCSW Phone Number: 05/20/2022, 11:26 AM   Clinical Narrative:     CSW received confirmation that patient can discharge to [facility] today.  Patient to be d/c'ed today to [facility] and [place] room [phone].  Patient and family agreeable to plans will transport via ems RN to call report to [phone].  Patient's family aware that he is discharging today.  [facility] to follow patient at [facility].    1:35pm CSW spoke to [facility] on call nurse [phone] to make her aware that patient discharged today to SNF.  She will make note and update Hospice team.     Final next level of care: Skilled Nursing Facility Barriers to Discharge: Barriers Resolved   Patient Goals and CMS Choice Patient states their goals for this hospitalization and ongoing recovery are:: To go to SNF then eventually return back home. CMS Medicare.gov Compare Post Acute Care list provided to:: Patient Choice offered to / list presented to : Patient, Adult Children, Sibling, Spouse  Discharge Placement PASRR number recieved: 04/19/22            Patient chooses bed at: [facility] Patient to be transferred to facility by: [facility] Name of family member notified: Patient's sig other at bedside. Patient and family notified of of transfer: 05/20/22  Discharge Plan and Services                                     Social Determinants of Health (SDOH) Interventions     Readmission Risk Interventions     No data to display

## 2022-05-20 NOTE — Discharge Summary (Addendum)
Physician Discharge Summary  Gregory [name]. [id number]:[id number] DOB: 1956/01/08 DOA: 04/17/2022  PCP: Pcp, No  Admit date: 04/17/2022 Discharge date: 05/20/2022  Admitted From: Home Disposition: Skilled nursing facility  Recommendations for Outpatient Follow-up:  As per palliative provider at skilled nursing facility.  Discharge Condition: Fair CODE STATUS: DNR Diet recommendation: Regular diet  Discharge summary: 66 year old man PMH metastatic renal cell cancer with widespread bony metastasis to the spine, currently undergoing radiation therapy for known epidural tumor, undergoing chemotherapy, presented with worsening left leg weakness to [facility].  After discussion with his oncologist, plan was made to transfer to [facility] for radiation therapy.  Patient completed XRT and is medically optimized for DC to SNF (since 04/25/2022) but insurance denied SNF admission even after completing peer to peer review with Psychologist, occupational.  Patient/family appealed the denial which was upheld again.  A third-party, [employer], reviewed and he is still denied.   Pain management by palliative care.   Ultimately, patient was able to find a placement and be discharging today.  Treated for following conditions.  Left leg weakness Acute on chronic Cervical spine with pathological fracture and retropulsion, para spinal tumor with cord compression Extensive metastatic disease thoracic spine with epidural tumor at T10, T12.  Epidural disease L4 Renal cell carcinoma w/ Metastatic disease to bones including, mets to lungs, liver  -no further treatment options available: Leg weakness due to metastatic disease.Patient completed XRT on 04/24/2022  Current pain management on Decadron 8 mg daily Oxycodone 20 mg 3 times daily Short-acting oxycodone 10 mg every 4 hours as needed Celebrex 200 mg twice daily Ativan as needed Family wants symptom management in the hospice setting.  NM whole-body scan  8/24 with progressive osseous metastatic disease since 5/30.  Patient with ongoing functional decline. Has increasing symptom burden and high risk of worsening cord compression.     Anemia of metastatic disease/CKD, stable  Port-A-Cath in place   Tobacco use : Continue NicoDerm.    Medically stable to transfer to skilled level of care.   Addendum: Please consult [facility] of [place] when he arrives to SNF. He is immunocompromised with high does steroids and should have a private or semiprivate room available.     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 Change foam dressing Q 3 days or PRN soiling) Hold foam dressing in place with mesh underwear when out of bed.  Avoid use of tape if possible.   Increase activity slowly   Complete by: As directed       Allergies as of 05/20/2022   No Known Allergies      Medication List     STOP taking these medications    B-12 3000 MCG Caps   Biotin 2500 MCG Caps   cephALEXin 500 MG capsule Commonly known as: KEFLEX   ELDERBERRY PO   OPDIVO IV  VITAMIN C PO   Vitamin D 50 MCG (2000 UT) tablet   YERVOY IV       TAKE these medications    acetaminophen 325 MG tablet Commonly known as: TYLENOL Take 2 tablets (650 mg total) by mouth every 6 (six) hours as needed for mild pain (or Fever >/= 101).   celecoxib 200 MG capsule Commonly known as: CELEBREX Take  1 capsule (200 mg total) by mouth 2 (two) times daily.   dexamethasone 4 MG tablet Commonly known as: DECADRON Take 2 tablets (8 mg total) by mouth daily. What changed: how much to take   gabapentin 100 MG capsule Commonly known as: Neurontin Take 1 capsule (100 mg total) by mouth 3 (three) times daily.   LORazepam 1 MG tablet Commonly known as: ATIVAN Take 1 tablet (1 mg total) by mouth at bedtime as needed for up to 5 days for anxiety.   nicotine 14 mg/24hr patch Commonly known as: NICODERM CQ - dosed in mg/24 hours Place 1 patch (14 mg total) onto the skin daily.   Oxycodone HCl 10 MG Tabs Take 1 tablet (10 mg total) by mouth every 4 (four) hours as needed for up to 5 days. What changed: Another medication with the same name was added. Make sure you understand how and when to take each.   oxyCODONE 20 mg 12 hr tablet Commonly known as: OXYCONTIN Take 1 tablet (20 mg total) by mouth every 8 (eight) hours for 5 days. What changed: You were already taking a medication with the same name, and this prescription was added. Make sure you understand how and when to take each.   polyethylene glycol 17 g packet Commonly known as: MIRALAX / GLYCOLAX Take 17 g by mouth daily.   senna-docusate 8.6-50 MG tablet Commonly known as: Senokot-S Take 1 tablet by mouth 2 (two) times daily.   tamsulosin 0.4 MG Caps capsule Commonly known as: FLOMAX Take 1 capsule (0.4 mg total) by mouth daily after supper.   traZODone 50 MG tablet Commonly known as: DESYREL Take 1 tablet (50 mg total) by mouth at bedtime as needed for up to 5 days for sleep.               Discharge Care Instructions  (From admission, onward)           Start     Ordered   05/20/22 0000  Discharge wound care:       Comments: Apply a piece of Aquacel Kellie Simmering # [id number]) to right groin, using swab to fill, then cover with foam dressing. Change foam dressing Q 3 days or PRN soiling) Hold foam dressing in place with  mesh underwear when out of bed.  Avoid use of tape if possible.   05/20/22 1003            Contact information for after-discharge care     [facility] .   Service: Skilled Nursing Contact information: [address] [phone]                    No Known Allergies  Consultations: Oncology Palliative care   Procedures/Studies: NM Bone Scan Whole Body  Result Date: 04/26/2022 CLINICAL DATA:  Metastatic disease evaluation, history of renal cell carcinoma EXAM: NUCLEAR MEDICINE WHOLE BODY BONE SCAN TECHNIQUE: Whole body anterior and posterior images were obtained approximately 3 hours after intravenous injection of radiopharmaceutical. RADIOPHARMACEUTICALS:  19.2 mCi Technetium-39mMDP IV COMPARISON:  01/30/2022 Correlation: MRI cervical spine, thoracic spine, and lumbar spine from July in August of 2023 FINDINGS: Numerous sites of abnormal osseous tracer uptake are seen consistent with widespread osseous metastatic disease, increased in number since previous study. These include BILATERAL ribs, skull base, sternum, RIGHT humerus, spine, pelvis, and BILATERAL proximal femora. Multiple sites uptake in ribs, spine, RIGHT humerus, pelvis, and femora are new since prior exam. Expected urinary tract and soft tissue distribution of tracer. IMPRESSION: Progressive osseous metastatic disease since 01/30/2022 Electronically Signed   By: MLavonia DanaM.D.   On: 04/26/2022 13:59   (Echo, Carotid, EGD, Colonoscopy, ERCP)    Subjective: Patient seen and examined.  Pain is controlled.  Denies any complaints.  Eager to get out of the hospital.   Discharge Exam: Vitals:   05/19/22 2316 05/20/22 0610  BP: 112/69 107/70  Pulse: 80 89  Resp: 18 20  Temp: 98.3 F (36.8 C) 98.1 F (36.7 C)  SpO2: 99% 97%   Vitals:   05/19/22 1432 05/19/22 2316 05/20/22 0610 05/20/22 0700  BP: 114/82  112/69 107/70   Pulse: 87 80 89   Resp: '16 18 20   '$ Temp: 98.3 F (36.8 C) 98.3 F (36.8 C) 98.1 F (36.7 C)   TempSrc: Oral Oral Oral   SpO2: 97% 99% 97%   Weight:    50.7 kg  Height:        General: Pt is alert, awake, not in acute distress.  Looks comfortable today. Cardiovascular: RRR, S1/S2 +, no rubs, no gallops Respiratory: CTA bilaterally, no wheezing, no rhonchi Abdominal: Soft, NT, ND, bowel sounds +     The results of significant diagnostics from this hospitalization (including imaging, microbiology, ancillary and laboratory) are listed below for reference.     Microbiology: No results found for this or any previous visit (from the past 240 hour(s)).   Labs: BNP (last 3 results) No results for input(s): "BNP" in the last 8760 hours. Basic Metabolic Panel: No results for input(s): "NA", "K", "CL", "CO2", "GLUCOSE", "BUN", "CREATININE", "CALCIUM", "MG", "PHOS" in the last 168 hours. Liver Function Tests: No results for input(s): "AST", "ALT", "ALKPHOS", "BILITOT", "PROT", "ALBUMIN" in the last 168 hours. No results for input(s): "LIPASE", "AMYLASE" in the last 168 hours. No results for input(s): "AMMONIA" in the last 168 hours. CBC: No results for input(s): "WBC", "NEUTROABS", "HGB", "HCT", "MCV", "PLT" in the last 168 hours. Cardiac Enzymes: No results for input(s): "CKTOTAL", "CKMB", "CKMBINDEX", "TROPONINI" in the last 168 hours. BNP: Invalid input(s): "POCBNP" CBG: No results for input(s): "GLUCAP" in the last 168 hours. D-Dimer No results for input(s): "DDIMER" in the last 72 hours. Hgb A1c No results for input(s): "HGBA1C" in the last 72 hours. Lipid Profile No results for input(s): "CHOL", "HDL", "LDLCALC", "TRIG", "CHOLHDL", "LDLDIRECT" in the last 72 hours. Thyroid function studies No results for input(s): "TSH", "T4TOTAL", "T3FREE", "THYROIDAB" in the last 72 hours.  Invalid input(s): "FREET3" Anemia work up No results for input(s):  "VITAMINB12", "FOLATE", "FERRITIN", "TIBC", "IRON", "RETICCTPCT" in the last 72 hours. Urinalysis No results found for: "COLORURINE", "APPEARANCEUR", "LABSPEC", "PHURINE", "GLUCOSEU", "HGBUR", "BILIRUBINUR", "KETONESUR", "PROTEINUR", "UROBILINOGEN", "NITRITE", "LEUKOCYTESUR" Sepsis Labs No results for input(s): "WBC" in the last 168 hours.  Invalid input(s): "PROCALCITONIN", "LACTICIDVEN" Microbiology No results found for this or any previous visit (from the past 240 hour(s)).   Time coordinating discharge: 32 minutes  SIGNED:   KBarb Merino MD  [facility] 05/20/2022, 10:03 AM

## 2022-05-20 NOTE — Progress Notes (Signed)
[facility] to give report and no one is answering. Will try again.

## 2022-05-20 NOTE — Discharge Instructions (Signed)
Patient is immunocompromised on high dose steroids for cancer. Recommend single room at facility when possible and this accomodation need can be met.[facility] to see at facility.

## 2022-05-20 NOTE — Progress Notes (Signed)
Tried calling report to [facility], no one is answering.  Randall Hiss the case manager was informed.

## 2022-05-22 ENCOUNTER — Ambulatory Visit: Payer: PPO | Admitting: Urology

## 2022-05-23 ENCOUNTER — Ambulatory Visit: Payer: Self-pay | Admitting: Urology

## 2022-06-20 ENCOUNTER — Encounter (HOSPITAL_COMMUNITY): Payer: Self-pay | Admitting: Hematology

## 2022-06-20 ENCOUNTER — Non-Acute Institutional Stay: Payer: Medicare Other | Admitting: Primary Care

## 2022-06-20 DIAGNOSIS — C649 Malignant neoplasm of unspecified kidney, except renal pelvis: Secondary | ICD-10-CM

## 2022-06-20 DIAGNOSIS — S34104S Unspecified injury to L4 level of lumbar spinal cord, sequela: Secondary | ICD-10-CM

## 2022-06-20 NOTE — Progress Notes (Signed)
  [facility] Consult Note Telephone: [phone]  Fax: [phone]   Date of encounter: 06/20/22 10:16 AM PATIENT NAME: Gregory Glen Gronew[place] Jr. [address] [place] [address]   [phone] (home)  DOB: 05/12/1956 MRN: [id number] PRIMARY CARE PROVIDER:    Dr "K" Oncology Bacon Camilla  REFERRING PROVIDER:   Dr Joel Blass  RESPONSIBLE PARTY:    Contact Information     Name Relation Home Work Mobile   Stephens,Starr Daughter [phone]     DAVIS,KATHY Other [phone]          I met face to face with patient and family in [facility] facility. Palliative Care was asked to follow this patient by consultation request of  Dr. Blass to address advance care planning and complex medical decision making. This is the initial visit.                                     ASSESSMENT AND PLAN / RECOMMENDATIONS:   Advance Care Planning/Goals of Care: Goals include to maximize quality of life and symptom management. Patient/health care surrogate gave his/her permission to discuss.Our advance care planning conversation included a discussion about:    The value and importance of advance care planning  Experiences with loved ones who have been seriously ill or have died  Exploration of personal, cultural or spiritual beliefs that might influence medical decisions  Exploration of goals of care in the event of a sudden injury or illness  Identification of a healthcare agent - sister, daughter and girlfriend Discussed hospice services, what they provide Discussed his advancing disease and comfort oriented goals CODE STATUS: DNR We discussed his goals of care and I did mention hospice as he voiced. There were no more treatments available to him. He became tearful, remembering his mother had been hospice. His sisters stated that hospice, and not being a positive experience for them. I outlined the various  services.   I spent 20  minutes providing this consultation. More than 50% of the time in this consultation was spent in counseling and care coordination. ---------------------------------------------------------------------------------------------------------------- Symptom Management/Plan:  I met with patient and his sister in his nursing home room. He has metastatic cancer and is currently doing physical therapy in order to increase mobility. We discussed his pain management, which appears to be satisfactory at 20 mg of oxycodone every eight hours with 10 mg PRN breakthrough although he reports pain often in a groin wound. I would suggest increasing his basal to 20 mg every six hours to reflect more consistent pharmacodynamics for pain management.   He does endorse occasional constipation and should be on a rigorous bowel protocol.   They spoke of potentially bringing him home, but that caregiving would be onerous due to the distance that the family lived from  Each other. He also outlined his finances stating he has pre-paid his funeral and they have applied for Medicaid benefits.   I would recommend hospice admission in place at the earliest opportunity. He's currently receiving physical therapy services so this would be sometime in the future. However, due to his diagnosis and palliative goals he would be appropriate for hospice services when concordant with family goals of care. Patient endorses that his sister, his daughter and girlfriend are also assisting him with care decisions.    Follow up Palliative Care Visit: Palliative care will continue to follow for complex medical   decision making, advance care planning, and clarification of goals. Return 2 weeks or prn.  This visit was coded based on medical decision making (MDM).  PPS: 30%  HOSPICE ELIGIBILITY/DIAGNOSIS: yes/metastatic renal cell CA  Chief Complaint: pain, debility  HISTORY OF PRESENT ILLNESS:  Gregory Glen Welles Jr. is a 66 y.o. year old male   with metastatic renal cell ca, extensive mets and pain, debility . Patient seen today to review palliative care needs to include medical decision making and advance care planning as appropriate.   History obtained from review of EMR, discussion with primary team, and interview with family, facility staff/caregiver and/or Gregory [name].  I reviewed available labs, medications, imaging, studies and related documents from the EMR.  Records reviewed and summarized above.   ROS   General: NAD ENMT: denies dysphagia Cardiovascular: denies chest pain, denies DOE Pulmonary: denies cough, denies increased SOB Abdomen: endorses good appetite, denies constipation, endorses continence of bowel GU: denies dysuria, endorses continence of urine MSK:  endorses increased weakness,  no falls reported Skin: denies rashes, endorses groin wound Neurological:endorses  pain, denies insomnia Psych: Endorses positive mood, but teary speaking of EOL Heme/lymph/immuno: denies bruises, abnormal bleeding  Physical Exam: Current and past weights: 118 lbs Constitutional: NAD General: frail appearing, thin EYES: anicteric sclera, lids intact, no discharge  ENMT: intact hearing, oral mucous membranes moist CV: no LE edema Pulmonary:  no increased work of breathing, no cough, room air Abdomen: intake 60%, soft and non tender, no ascites GU: deferred MSK: +sarcopenia, moves all extremities, non ambulatory Skin: warm and dry, no rashes or wounds on visible skin, reported groin wound/tumor Neuro:  +generalized weakness,  no cognitive impairment Psych: slight anxious affect, A and O x 3 Hem/lymph/immuno: no widespread bruising  CURRENT PROBLEM LIST:  Patient Active Problem List   Diagnosis Date Noted   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   L4 spinal cord injury (HCC) 04/17/2022    Class: Acute   Left leg weakness 04/17/2022    Class: Acute   Port-A-Cath in place 02/04/2022   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) 01/19/2022   History of malignant neoplasm metastatic to brain 10/28/2020   Tobacco use disorder 06/18/2016   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) 11/06/2015   Renal cell carcinoma (HCC) 10/23/2015   Skull lesion 10/20/2015   PAST MEDICAL HISTORY:  Active Ambulatory Problems    Diagnosis Date Noted   Skull lesion 10/20/2015   Renal cell carcinoma (HCC) 10/23/2015   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) 11/06/2015   Tobacco use disorder 06/18/2016   History of malignant neoplasm metastatic to brain 10/28/2020   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) 01/19/2022   Port-A-Cath in place 02/04/2022   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   L4 spinal cord injury (HCC) 04/17/2022   Left leg weakness 04/17/2022   Resolved Ambulatory Problems    Diagnosis Date Noted   No Resolved Ambulatory Problems   No Additional Past Medical History   SOCIAL HX:  Social History   Tobacco Use   Smoking status: Every Day    Packs/day: 0.50    Years: 45.00    Total pack years: 22.50    Types: Cigarettes   Smokeless tobacco: Never  Substance Use Topics   Alcohol use: Not Currently    Alcohol/week: 20.0 standard drinks of alcohol    Types: 20 Shots of liquor per week      Comment: 1-5 shot of liquor per night   FAMILY HX:  Family History  Problem Relation Age of Onset   CAD Mother    CAD Father    Cancer Paternal Uncle        stomach cancer   Cancer Paternal Uncle        unknown      ALLERGIES: No Known Allergies   PERTINENT MEDICATIONS:  Outpatient Encounter Medications as of 06/20/2022  Medication Sig   acetaminophen (TYLENOL) 325 MG tablet Take 2 tablets (650 mg total) by mouth every 6 (six) hours as needed for mild pain (or Fever >/= 101).   dexamethasone (DECADRON) 4 MG tablet Take 2 tablets (8 mg total) by mouth daily.   gabapentin (NEURONTIN) 100 MG capsule Take  1 capsule (100 mg total) by mouth 3 (three) times daily.   Lidocaine (LIDODERM EX) Apply 1 Application topically 2 (two) times daily. To groin wound   meloxicam (MOBIC) 15 MG tablet Take 15 mg by mouth daily.   nicotine (NICODERM CQ - DOSED IN MG/24 HOURS) 14 mg/24hr patch Place 1 patch (14 mg total) onto the skin daily.   Oxycodone HCl 10 MG TABS Take 1 tablet by mouth every 4 (four) hours as needed.   Oxycodone HCl 10 MG TABS Take 20 mg by mouth every 8 (eight) hours.   polyethylene glycol (MIRALAX / GLYCOLAX) 17 g packet Take 17 g by mouth daily. (Patient taking differently: Take 17 g by mouth 2 (two) times daily.)   senna-docusate (SENOKOT-S) 8.6-50 MG tablet Take 1 tablet by mouth 2 (two) times daily.   tamsulosin (FLOMAX) 0.4 MG CAPS capsule Take 1 capsule (0.4 mg total) by mouth daily after supper.   traZODone (DESYREL) 50 MG tablet Take 1 tablet (50 mg total) by mouth at bedtime as needed for up to 5 days for sleep.   [DISCONTINUED] celecoxib (CELEBREX) 200 MG capsule Take 1 capsule (200 mg total) by mouth 2 (two) times daily.   No facility-administered encounter medications on file as of 06/20/2022.     Thank you for the opportunity to participate in the care of Gregory [name].  The palliative care team will continue to follow. Please call our office at [phone] if we can be of additional assistance.   Jason Coop, NP , DNP, AGPCNP-BC  COVID-19 PATIENT SCREENING TOOL Asked and negative response unless otherwise noted:  Have you had symptoms of covid, tested positive or been in contact with someone with symptoms/positive test in the past 5-10 days?\

## 2022-06-21 ENCOUNTER — Telehealth: Payer: Self-pay

## 2022-06-21 NOTE — Telephone Encounter (Signed)
215 pm.  Return call made to daughter Lorenza Chick who is asking for update on patient's PC visit.  Reviewed NP note with daughter and discussed possible hospice assessment once therapy is completed.  Daughter requested phone call updates during NP visit or after.  Update provided to Ralene Bathe, NP.

## 2022-07-18 ENCOUNTER — Non-Acute Institutional Stay: Payer: Medicare Other | Admitting: Primary Care

## 2022-07-18 DIAGNOSIS — R29898 Other symptoms and signs involving the musculoskeletal system: Secondary | ICD-10-CM

## 2022-07-18 DIAGNOSIS — S34104S Unspecified injury to L4 level of lumbar spinal cord, sequela: Secondary | ICD-10-CM

## 2022-07-18 DIAGNOSIS — Z515 Encounter for palliative care: Secondary | ICD-10-CM

## 2022-07-18 DIAGNOSIS — C649 Malignant neoplasm of unspecified kidney, except renal pelvis: Secondary | ICD-10-CM

## 2022-07-18 NOTE — Progress Notes (Addendum)
  [facility] Consult Note Telephone: [phone]  Fax: [phone]    Date of encounter: 07/18/22 11:58 AM PATIENT NAME: Gregory Glen [place] Jr. [address] [place] [address]   [phone] (home)  DOB: 08/03/1956 MRN: [id number] PRIMARY CARE PROVIDER:    Blass, Joel, MD [address] [place] [address] [phone]   REFERRING PROVIDER:   Blass, Joel, MD [address] [place] [address] [phone]  RESPONSIBLE PARTY:    Contact Information     Name Relation Home Work Mobile   Stephens,Gregory [name] Daughter [phone]     [name],Gregory Other [phone]         I met face to face with patient in [facility] facility. Palliative Care was asked to follow this patient by consultation request of  [name], Joel, MD  to address advance care planning and complex medical decision making. This is a follow up visit.                                   ASSESSMENT AND PLAN / RECOMMENDATIONS:   Advance Care Planning/Goals of Care: Goals include to maximize quality of life and symptom management. Patient/health care surrogate gave his/her permission to discuss. Our advance care planning conversation included a discussion about:     Exploration of personal, cultural or spiritual beliefs that might influence medical decisions  Exploration of goals of care in the event of a sudden injury or illness  Identification of a healthcare agent - daughter Gregory [name]  Discussed with daughter RE status Review  of an  advance directive document  Decision not to resuscitate or to de-escalate disease focused treatments due to poor prognosis. CODE STATUS:  DNR I reviewed  a MOST form today. The patient and family outlined their wishes for the following treatment decisions:  Cardiopulmonary Resuscitation: Do Not Attempt Resuscitation (DNR/No CPR)  Medical Interventions: Comfort Measures: Keep clean, warm, and dry. Use medication by any route,  positioning, wound care, and other measures to relieve pain and suffering. Use oxygen, suction and manual treatment of airway obstruction as needed for comfort. Do not transfer to the hospital unless comfort needs cannot be met in current location.  Antibiotics: Determine use of limitation of antibiotics when infection occurs  IV Fluids: IV fluids for a defined trial period  Feeding Tube: No feeding tube   Symptom Management/Plan:  Pain; continues to be moderately controlled. I would recommend long acting oxycodone q 8 hrs with short acting prn available. I recommend oxycontin 30 mg q 12 hrs po with oxycodone 10 mg po q 4 hrs prn available.  Nutrition: Endorses fair appetite. Weight is improving during SNF stay.  Mobility: Endorses he is oob in w/c. No notes currently for therapy care.  Follow up Palliative Care Visit: Palliative care will continue to follow for complex medical decision making, advance care planning, and clarification of goals. Return 4 weeks or prn.  This visit was coded based on medical decision making (MDM).  PPS: 40%  HOSPICE ELIGIBILITY/DIAGNOSIS: TBD  Chief Complaint: pain from cancer  HISTORY OF PRESENT ILLNESS:  Gregory Glen Swager Jr. is a 66 y.o. year old male  with renal cell ca, debility,chronic pain,  functional paraplegia .   History obtained from review of EMR, discussion with primary team, and interview with family, facility staff/caregiver and/or Gregory [name].  I reviewed available labs, medications, imaging, studies and related documents from the EMR.    Records reviewed and summarized above.   ROS   General: NAD EYES: denies vision changes ENMT: denies dysphagia Cardiovascular: denies chest pain, denies DOE Pulmonary: denies cough, denies increased SOB Abdomen: endorses good appetite, denies constipation, endorses continence of bowel GU: denies dysuria, endorses continence of urine MSK:  denies increased weakness, no falls reported Skin: denies  rashes , endorses wound Neurological: denies pain, denies insomnia Psych: Endorses positive mood Heme/lymph/immuno: denies bruises, abnormal bleeding  Physical Exam: Current and past weights: 118 lbs. Wt is actually improving, up 3 lbs since 7/23. Constitutional: NAD General: frail appearing, thin EYES: anicteric sclera, lids intact, no discharge  ENMT: intact hearing, oral mucous membranes moist, dentition intact CV: no LE edema Pulmonary: no increased work of breathing, no cough, room air Abdomen: intake 70%,soft and non tender, no ascites GU: deferred MSK: + sarcopenia, moves all upper extremities,  non -ambulatory Skin: warm and dry, no rashes, wounds present, not observed, wound MD here today. Neuro:  + generalized weakness,  no cognitive impairment Psych: non-anxious affect, A and O x 3 Hem/lymph/immuno: no widespread bruising   Thank you for the opportunity to participate in the care of Gregory [name]. Please call our office at [phone] if we can be of additional assistance.   Jason Coop, NP   COVID-19 PATIENT SCREENING TOOL Asked and negative response unless otherwise noted:   Have you had symptoms of covid, tested positive or been in contact with someone with symptoms/positive test in the past 5-10 days?

## 2022-07-31 NOTE — Progress Notes (Signed)
VM received from [place] requesting call back regarding this patient. Attempted to call x2. Unable to reach [name] directly, no VM box available.

## 2022-08-17 NOTE — Progress Notes (Signed)
  Radiation Oncology         [phone]) [phone] ________________________________  Name: Gregory [name]. 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   Beams/energy:   A 3D field set-up was employed with 6 MV X-rays  Narrative: The patient tolerated radiation treatment relatively well.     Plan: The patient has completed radiation treatment. The patient will return to radiation oncology clinic for routine followup in one month. I advised him to call or return sooner if he has any questions or concerns related to his recovery or treatment. ________________________________  Sheral Apley. Tammi Klippel, M.D.

## 2022-09-03 DEATH — deceased
